# Patient Record
Sex: Female | Born: 1955 | Race: White | Hispanic: No | Marital: Married | Smoking: Never smoker
Health system: Southern US, Community
[De-identification: ages and names within clinical notes are randomized; demographics above are authoritative.]

## PROBLEM LIST (undated history)

## (undated) DIAGNOSIS — C20 Malignant neoplasm of rectum: Secondary | ICD-10-CM

## (undated) DIAGNOSIS — G5700 Lesion of sciatic nerve, unspecified lower limb: Secondary | ICD-10-CM

## (undated) DIAGNOSIS — M549 Dorsalgia, unspecified: Secondary | ICD-10-CM

## (undated) DIAGNOSIS — Z9889 Other specified postprocedural states: Secondary | ICD-10-CM

## (undated) DIAGNOSIS — Z87442 Personal history of urinary calculi: Secondary | ICD-10-CM

## (undated) DIAGNOSIS — D128 Benign neoplasm of rectum: Secondary | ICD-10-CM

## (undated) DIAGNOSIS — G8929 Other chronic pain: Secondary | ICD-10-CM

## (undated) DIAGNOSIS — G20A1 Parkinson's disease without dyskinesia, without mention of fluctuations: Secondary | ICD-10-CM

## (undated) DIAGNOSIS — R198 Other specified symptoms and signs involving the digestive system and abdomen: Secondary | ICD-10-CM

## (undated) DIAGNOSIS — Z932 Ileostomy status: Secondary | ICD-10-CM

## (undated) DIAGNOSIS — Z8249 Family history of ischemic heart disease and other diseases of the circulatory system: Secondary | ICD-10-CM

## (undated) HISTORY — DX: Lesion of sciatic nerve, unspecified lower limb: G57.00

## (undated) HISTORY — DX: Other specified symptoms and signs involving the digestive system and abdomen: R19.8

## (undated) HISTORY — DX: Parkinson's disease without dyskinesia, without mention of fluctuations: G20.A1

## (undated) HISTORY — PX: APPENDECTOMY: SHX54

## (undated) HISTORY — PX: CHOLECYSTECTOMY: SHX55

## (undated) HISTORY — PX: TUBAL LIGATION: SHX77

## (undated) HISTORY — PX: ENDOMETRIAL ABLATION: SHX621

## (undated) HISTORY — PX: OTHER SURGICAL HISTORY: SHX169

---

## 1985-08-31 HISTORY — PX: OTHER SURGICAL HISTORY: SHX169

## 1998-03-21 ENCOUNTER — Ambulatory Visit (HOSPITAL_COMMUNITY): Admission: RE | Admit: 1998-03-21 | Discharge: 1998-03-21 | Payer: Self-pay | Admitting: Urology

## 1998-03-28 ENCOUNTER — Observation Stay (HOSPITAL_COMMUNITY): Admission: EM | Admit: 1998-03-28 | Discharge: 1998-03-30 | Payer: Self-pay | Admitting: Emergency Medicine

## 2000-04-20 ENCOUNTER — Emergency Department (HOSPITAL_COMMUNITY): Admission: EM | Admit: 2000-04-20 | Discharge: 2000-04-21 | Payer: Self-pay | Admitting: Emergency Medicine

## 2000-05-25 ENCOUNTER — Other Ambulatory Visit: Admission: RE | Admit: 2000-05-25 | Discharge: 2000-05-25 | Payer: Self-pay | Admitting: Gynecology

## 2000-06-16 ENCOUNTER — Other Ambulatory Visit: Admission: RE | Admit: 2000-06-16 | Discharge: 2000-06-16 | Payer: Self-pay | Admitting: Gynecology

## 2000-06-16 ENCOUNTER — Encounter (INDEPENDENT_AMBULATORY_CARE_PROVIDER_SITE_OTHER): Payer: Self-pay

## 2000-07-30 ENCOUNTER — Encounter (INDEPENDENT_AMBULATORY_CARE_PROVIDER_SITE_OTHER): Payer: Self-pay | Admitting: Specialist

## 2000-07-30 ENCOUNTER — Ambulatory Visit (HOSPITAL_COMMUNITY): Admission: RE | Admit: 2000-07-30 | Discharge: 2000-07-30 | Payer: Self-pay | Admitting: Gynecology

## 2001-09-21 ENCOUNTER — Other Ambulatory Visit: Admission: RE | Admit: 2001-09-21 | Discharge: 2001-09-21 | Payer: Self-pay | Admitting: Obstetrics and Gynecology

## 2002-03-20 ENCOUNTER — Encounter: Payer: Self-pay | Admitting: Urology

## 2002-03-20 ENCOUNTER — Ambulatory Visit (HOSPITAL_BASED_OUTPATIENT_CLINIC_OR_DEPARTMENT_OTHER): Admission: RE | Admit: 2002-03-20 | Discharge: 2002-03-20 | Payer: Self-pay | Admitting: Urology

## 2003-11-01 ENCOUNTER — Other Ambulatory Visit: Admission: RE | Admit: 2003-11-01 | Discharge: 2003-11-01 | Payer: Self-pay | Admitting: Gynecology

## 2004-01-25 ENCOUNTER — Ambulatory Visit (HOSPITAL_COMMUNITY): Admission: RE | Admit: 2004-01-25 | Discharge: 2004-01-25 | Payer: Self-pay | Admitting: Urology

## 2004-01-25 ENCOUNTER — Ambulatory Visit (HOSPITAL_BASED_OUTPATIENT_CLINIC_OR_DEPARTMENT_OTHER): Admission: RE | Admit: 2004-01-25 | Discharge: 2004-01-25 | Payer: Self-pay | Admitting: Urology

## 2004-01-31 ENCOUNTER — Ambulatory Visit (HOSPITAL_COMMUNITY): Admission: RE | Admit: 2004-01-31 | Discharge: 2004-01-31 | Payer: Self-pay | Admitting: Urology

## 2006-05-07 ENCOUNTER — Other Ambulatory Visit: Admission: RE | Admit: 2006-05-07 | Discharge: 2006-05-07 | Payer: Self-pay | Admitting: Gynecology

## 2006-06-11 ENCOUNTER — Encounter: Admission: RE | Admit: 2006-06-11 | Discharge: 2006-06-11 | Payer: Self-pay | Admitting: Gastroenterology

## 2006-08-30 ENCOUNTER — Inpatient Hospital Stay (HOSPITAL_COMMUNITY): Admission: RE | Admit: 2006-08-30 | Discharge: 2006-09-09 | Payer: Self-pay | Admitting: Surgery

## 2006-08-30 ENCOUNTER — Encounter (INDEPENDENT_AMBULATORY_CARE_PROVIDER_SITE_OTHER): Payer: Self-pay | Admitting: Specialist

## 2006-08-31 HISTORY — PX: COLON RESECTION: SHX5231

## 2006-09-23 ENCOUNTER — Encounter: Admission: RE | Admit: 2006-09-23 | Discharge: 2006-09-23 | Payer: Self-pay | Admitting: General Surgery

## 2006-09-29 ENCOUNTER — Ambulatory Visit (HOSPITAL_COMMUNITY): Admission: RE | Admit: 2006-09-29 | Discharge: 2006-09-29 | Payer: Self-pay | Admitting: Surgery

## 2006-10-07 ENCOUNTER — Ambulatory Visit (HOSPITAL_COMMUNITY): Admission: RE | Admit: 2006-10-07 | Discharge: 2006-10-07 | Payer: Self-pay | Admitting: Interventional Radiology

## 2006-10-18 ENCOUNTER — Ambulatory Visit (HOSPITAL_COMMUNITY): Admission: RE | Admit: 2006-10-18 | Discharge: 2006-10-18 | Payer: Self-pay | Admitting: Interventional Radiology

## 2006-10-28 ENCOUNTER — Ambulatory Visit (HOSPITAL_COMMUNITY): Admission: RE | Admit: 2006-10-28 | Discharge: 2006-10-28 | Payer: Self-pay | Admitting: Interventional Radiology

## 2006-11-01 ENCOUNTER — Ambulatory Visit: Admission: RE | Admit: 2006-11-01 | Discharge: 2006-11-01 | Payer: Self-pay | Admitting: Surgery

## 2006-11-30 ENCOUNTER — Encounter: Admission: RE | Admit: 2006-11-30 | Discharge: 2006-11-30 | Payer: Self-pay | Admitting: Surgery

## 2007-01-12 ENCOUNTER — Ambulatory Visit (HOSPITAL_COMMUNITY): Admission: RE | Admit: 2007-01-12 | Discharge: 2007-01-12 | Payer: Self-pay | Admitting: Surgery

## 2007-02-02 ENCOUNTER — Encounter: Admission: RE | Admit: 2007-02-02 | Discharge: 2007-02-02 | Payer: Self-pay | Admitting: Surgery

## 2007-02-21 ENCOUNTER — Encounter: Admission: RE | Admit: 2007-02-21 | Discharge: 2007-02-21 | Payer: Self-pay | Admitting: Surgery

## 2007-03-30 ENCOUNTER — Inpatient Hospital Stay (HOSPITAL_COMMUNITY): Admission: RE | Admit: 2007-03-30 | Discharge: 2007-04-06 | Payer: Self-pay | Admitting: Surgery

## 2007-03-30 ENCOUNTER — Encounter (INDEPENDENT_AMBULATORY_CARE_PROVIDER_SITE_OTHER): Payer: Self-pay | Admitting: Surgery

## 2007-09-01 HISTORY — PX: OTHER SURGICAL HISTORY: SHX169

## 2007-09-13 ENCOUNTER — Ambulatory Visit (HOSPITAL_BASED_OUTPATIENT_CLINIC_OR_DEPARTMENT_OTHER): Admission: RE | Admit: 2007-09-13 | Discharge: 2007-09-13 | Payer: Self-pay | Admitting: Urology

## 2007-11-16 ENCOUNTER — Other Ambulatory Visit: Admission: RE | Admit: 2007-11-16 | Discharge: 2007-11-16 | Payer: Self-pay | Admitting: Gynecology

## 2008-03-31 HISTORY — PX: HERNIA REPAIR: SHX51

## 2009-05-15 ENCOUNTER — Inpatient Hospital Stay (HOSPITAL_COMMUNITY): Admission: RE | Admit: 2009-05-15 | Discharge: 2009-05-17 | Payer: Self-pay | Admitting: Surgery

## 2010-09-21 ENCOUNTER — Encounter: Payer: Self-pay | Admitting: General Surgery

## 2010-09-21 ENCOUNTER — Encounter: Payer: Self-pay | Admitting: Surgery

## 2010-11-30 HISTORY — PX: OTHER SURGICAL HISTORY: SHX169

## 2010-12-05 LAB — CBC
HCT: 38.8 % (ref 36.0–46.0)
Hemoglobin: 12.9 g/dL (ref 12.0–15.0)
MCV: 85.6 fL (ref 78.0–100.0)
Platelets: 278 10*3/uL (ref 150–400)
RBC: 4.54 MIL/uL (ref 3.87–5.11)
RDW: 14.1 % (ref 11.5–15.5)
WBC: 7 10*3/uL (ref 4.0–10.5)

## 2010-12-05 LAB — COMPREHENSIVE METABOLIC PANEL
CO2: 27 mEq/L (ref 19–32)
Chloride: 110 mEq/L (ref 96–112)
GFR calc non Af Amer: >60 mL/min (ref >60)
Potassium: 3.7 mEq/L (ref 3.5–5.1)
Sodium: 142 mEq/L (ref 135–145)
Total Protein: 7 g/dL (ref 6.0–8.3)

## 2010-12-22 ENCOUNTER — Encounter (INDEPENDENT_AMBULATORY_CARE_PROVIDER_SITE_OTHER): Payer: 59 | Admitting: Women's Health

## 2010-12-22 ENCOUNTER — Other Ambulatory Visit: Payer: Self-pay | Admitting: Women's Health

## 2010-12-22 ENCOUNTER — Other Ambulatory Visit (HOSPITAL_COMMUNITY)
Admission: RE | Admit: 2010-12-22 | Discharge: 2010-12-22 | Disposition: A | Discharge status: Home / Self Care | Payer: 59 | Source: Ambulatory Visit | Attending: Gynecology | Admitting: Gynecology

## 2010-12-22 DIAGNOSIS — Z124 Encounter for screening for malignant neoplasm of cervix: Secondary | ICD-10-CM | POA: Insufficient documentation

## 2010-12-22 DIAGNOSIS — E079 Disorder of thyroid, unspecified: Secondary | ICD-10-CM

## 2010-12-22 DIAGNOSIS — R823 Hemoglobinuria: Secondary | ICD-10-CM

## 2010-12-22 DIAGNOSIS — Z833 Family history of diabetes mellitus: Secondary | ICD-10-CM

## 2010-12-22 DIAGNOSIS — Z13220 Encounter for screening for lipoid disorders: Secondary | ICD-10-CM

## 2010-12-22 DIAGNOSIS — Z01419 Encounter for gynecological examination (general) (routine) without abnormal findings: Secondary | ICD-10-CM

## 2011-01-13 NOTE — Op Note (Signed)
[name], JEMISON NO.:  [id number]   MEDICAL RECORD NO.:  [id number]          PATIENT TYPE:  AMB   LOCATION:  NESC                         FACILITY:  [facility]   PHYSICIAN:  Boston Service, M.D.DATE OF BIRTH:  10/27/1955   DATE OF PROCEDURE:  09/13/2007  DATE OF DISCHARGE:                               OPERATIVE REPORT   PREOPERATIVE DIAGNOSIS:  A 7 mm left proximal ureteral calculus.   POSTOPERATIVE DIAGNOSIS:  A 7 mm left proximal ureteral calculus.   PROCEDURE:  Cystoscopy, retrograde ureteroscopy, holmium laser  fragmentation and stent placement.   SURGEON:  Boston Service, M.D.   ASSISTANT:  None.   ANESTHESIA:  General.   FINDINGS:  A 7 mm left ureteral calculus.   SPECIMENS:  Multitude of small stony fragments.   ESTIMATED BLOOD LOSS:  Minimal.   COMPLICATIONS:  None obvious.   DESCRIPTION OF PROCEDURE:  The patient was prepped and draped in the  dorsal lithotomy position after institution of an adequate level of  general anesthesia.  Well lubricated 21-French panendoscope was gently  inserted at the urethral meatus.   Blocking catheter was selected, right and left retrogrades were  performed.  Normal course and caliber of the ureter, pelvis and calyces  on the right.  The patient had what appeared to be a densely impacted  calculus at the level of the proximal third of the left ureter with  proximal hydronephrosis.   Once retrogrades had been completed, guidewire was inserted at the left  ureteral orifice with care.  Attempt was made to pass a conventional  guidewire beyond the stone, unable to do so.  Conventional guidewire was  removed and replaced with a Glidewire which advanced into what appeared  to be dilated upper pole calyces.  Retrograde confirm the position of  the Glidewire.  End-hole catheter was advanced over the Glidewire.  Conventional guidewire was passed through the end-hole catheter and then  the ureteroscope was  inserted alongside the guidewire.  Stone appeared  to be densely impacted within the proximal ureter.  There had been some  initial concerns that perhaps the stone would migrate proximally during  therapy, however, stone appeared impacted at that location.  The 365  fiber was selected and fragmentation was commenced at a setting of 0.5  joules.  Stone appeared to break up easily and was then fragmented over  a period of about 20-25 minutes.  Laser fiber was then withdrawn.  Ureteroscope was advanced.  No other large stony fragments could be  identified within the proximal ureter and then the ureteroscope was  carefully withdrawn.  The indwelling guidewire remained in good position  and was used to place a 6-French 24  cm double-J stent with what appeared to be good pigtail formation both  within the left renal pelvis and within the bladder.  Bladder was  drained.  Cystoscope was removed.  The patient was given a B&O  suppository and returned to recovery in satisfactory condition.           ______________________________  Boston Service, M.D.     RH/MEDQ  D:  09/13/2007  T:  09/13/2007  Job:  [id number]   cc:   [facility]

## 2011-01-13 NOTE — Op Note (Signed)
NAMELILLYANN, [name] NO.:  [id number]   MEDICAL RECORD NO.:  [id number]          PATIENT TYPE:  AMB   LOCATION:  DAY                          FACILITY:  [facility]   PHYSICIAN:  Wilmon Arms. Corliss Skains, M.D. 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   PROCEDURES PERFORMED:  1. Examination under anesthesia.  2. Rigid sigmoidoscopy.  3. Flexible sigmoidoscopy.  4. Rectal dilatation.   SURGEON:  Wilmon Arms. Corliss Skains, M.D.   ASSISTANTAngelia Mould. Derrell Lolling, M.D.   INDICATIONS:  The patient is a 55 year old female who underwent a low  anterior resection in December4 for a large, low, tubulovillous  adenoma.  At the time of surgery she had an inadvertent vaginal injury  which was immediately recognized and repaired.  She had an anastomosis  with an EEA stapler as well as a diverting ileostomy.  Postoperatively  the patient developed a pelvic abscess with apparent small fistulous  communication with the rectum.  This has persisted with the last barium  enema about a month ago.  That barium enema also showed no sign of  abscess cavity; and just a very tiny wisp of contrast.  Proximally there  seemed to be some rectal stricture above the anastomosis.   DESCRIPTION OF PROCEDURE:  The patient was brought to the operating  room, and placed in the supine position on the operating room table.  After an adequate level of general anesthesia was obtained, the  patient's legs were placed in lithotomy position in yellow fin stirrups.  Her perineum was prepped with Betadine and draped in a sterile fashion.  Her anal canal was dilated up to 3 fingers with lubricated fingers.  The  bivalve rectal retractor was inserted.  There seemed to be some mild  proctitis.   We were unable to visualize the staple line, but with  digital examination, I could feel the staple line at the tip of my  finger.  We then inserted the rigid sigmoidoscope.  We were able to  visualize the staple line and advanced just past that, but we were  unable to pass the sigmoidoscope much more proximally.   We then removed the sigmoidoscope and reinserted our retractors.  Using  Hegar dilators we dilated the stricture up to 49 [place].  This was done  with minimal resistance.  I was unable to pass the flexible  sigmoidoscope through the stricture and up into the descending colon.  The descending colon appeared to be normal.  As we slowly withdrew the  scope, there was some minimal inflammation of the mucosa around the  stricture.  The stricture let the scope pass easily.  The colon seemed  fairly tortuous around the area just above the anastomosis, but we were  able to negotiate this easily with the sigmoidoscope.  We slowly  withdrew the scope back into the rectum and suctioned out the air.  We  then changed gloves and with a sterile vaginal speculum, did a vaginal  examination.  The cervix was identified.  Posterior to this we could see  the area which appeared to be the site of the previous injury.  This was  completely  healed and covered with mucosa.  No sign of purulent drainage in the  vagina.  The retractors were then removed.  The patient was then  extubated, and brought to recovery in stable condition.  All sponge,  instrument, and needle counts were correct.      Wilmon Arms. Tsuei, M.D.  Electronically Signed     MKT/MEDQ  D:  01/12/2007  T:  01/12/2007  Job:  [id number]

## 2011-01-13 NOTE — Op Note (Signed)
[name], [name] NO.:  [id number]   MEDICAL RECORD NO.:  [id number]          PATIENT TYPE:  INP   LOCATION:  1308                         FACILITY:  [facility]   PHYSICIAN:  Wilmon Arms. Corliss Skains, M.D. 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   PROCEDURE PERFORMED:  Ileostomy closure.   SURGEON:  Wilmon Arms. Corliss Skains, M.D.   ASSISTANT:  Dr. Kendrick Ranch.   ANESTHESIA:  General endotracheal.   INDICATIONS:  The patient is a 55 year old female who is status post a  low anterior resection in December 2008 for a tubulovillous adenoma.  At  the time of surgery she had an inadvertent vaginal injury which was  repaired.  She had a very low rectal anastomosis with EEA stapler.  This  was protected with a loop ileostomy.  The patient developed a pelvic  abscess postoperatively which was drained.  She had an apparent small  leak at her rectal anastomosis.  This has been given time to heal with a  diverting ileostomy.  She is she is now presenting for ileostomy  closure.   DESCRIPTION OF PROCEDURE:  The patient is brought to the operating room  and placed in supine position on the operating table.  After an adequate  level of general anesthesia was obtained, the patient's abdomen was  exposed.  The ileostomy was closed with pursestring suture of 2-0 silk  suture.  A Foley catheter was placed under sterile technique.  The  patient's abdomen was prepped with Betadine and draped in sterile  fashion.  Elliptical incision was made around the ileostomy.  Cautery  was used to dissect down the subcutaneous tissues.  We dissected down  along the edges of the loop ileostomy.  We entered the peritoneal  cavity.  There were some adhesions which were taken down  with cautery  and Metzenbaum scissors.  Once the entire end of the loop was freely  mobile.  We brought it up into the wound.  We selected a segment of the  small bowel which was clean and free of scar tissue.  Enterotomies were  created on each limb of the small bowel.  The GIA stapler was inserted  into these enterotomies and a side-to-side stapled anastomosis was  created.  The common enterotomy was closed with another GIA stapler  which also amputated the remaining old ileostomy site.  The ileostomy  was palpated and was widely patent.  Hemostasis was good.  We cleared  the fascia of adhesions several centimeters in all directions.  The  peritoneal cavity was then  thoroughly irrigated with saline.  The fascia was closed with #1 PDS in  running fashion.  The subcutaneous tissues were copiously irrigated and  staples were loosely placed in the skin.  Dry dressing was applied.  The  patient was extubated and brought to recovery in stable condition.  All  sponge, instrument, and  needle counts correct.      Wilmon Arms. Tsuei, M.D.  Electronically Signed     MKT/MEDQ  D:  03/30/2007  T:  03/31/2007  Job:  [id number]

## 2011-01-16 NOTE — Consult Note (Signed)
NAMEKOREE, STAHELI NO.:  [id number]   MEDICAL RECORD NO.:  [id number]          PATIENT TYPE:  INP   LOCATION:  5737                         FACILITY:  [facility]   PHYSICIAN:  M. Leda Quail, MD  DATE OF BIRTH:  11/20/1955   DATE OF CONSULTATION:  DATE OF DISCHARGE:                                 CONSULTATION   Mrs. 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 Manus Rudd.  She is having a  resection of this tumor during which time I was called for consultation.  The consultation was requested because of an incision on the vagina.  At  the time of beginning to close the rectal anastomosis, a stapling  instrument was placed intravaginally .  Therefore, a line of staples was  placed across the vaginal mucosa and the vaginal mucosa was incised  sharply.  The staples were removed by Dr. Corliss Skains who called me for my  opinion about proceeding with the case.   At the time I entered the O.R. the patient was stable and good  visualization was noted.  I was sterilely gowned and gloved.  I  inspected intra-abdominally first which did show an incision in the  posterior vaginal mucosa.  The cervix could be palpated with my index  finger.  The uterus was retracted anteriorly.  At this time, a sterile  glove was placed on my right hand, and I inspected vaginally.  The  patient was in a low lithotomy position, and an intravaginal examination  was very easy.  The length my middle finger could not pass through the  incision in the vagina; however, Dr. Fatima Sanger finger placed in the  incision could meet mine.  This incision was therefore like a posterior  colpotomy incision.  Sterile gloves were changed again, and I inspected  again intra-abdominally.  At this time, a decision was made to go ahead  and close vaginal mucosa with a figure-of-eight sutures of #0 Vicryl.  These were placed intra-abdominally, and the mucosa both  anteriorly and  posteriorly were reapproximated.  Three figure-of-eight sutures were  placed to close this posterior colpotomy incision completely.  After  this was done, the vaginal mucosa was re-inspected with the operator's  index finger.  Dr. Fatima Sanger finger was across the suture line, and mine  was across the suture line intravaginally.  No sutures could be  palpated, and the incision was completely closed.  At this point, as my  services were no longer necessary I left the O.R.  The procedure will be  documented as a closure of posterior colpotomy incision.  There was  minimal blood loss from my portion of the procedure.  Dr. Corliss Skains will  continue the dictation for the surgery.      Lum Keas, MD  Electronically Signed     MSM/MEDQ  D:  08/30/2006  T:  08/31/2006  Job:  [id number]

## 2011-01-16 NOTE — Op Note (Signed)
[facility]  Patient:    [name], [name]                     MRN: [id number] Proc. Date: 07/30/00 Adm. Date:  [id number] Disc. Date: [id number] Attending:  Tobey Bride                           Operative Report  PATIENT NUMBER:               [id number]  PREOPERATIVE DIAGNOSIS:       1. Menometrorrhagia.                               2. Dysmenorrhea.                               3. Endometrial polyps/uterine fibroids.  POSTOPERATIVE DIAGNOSIS:      Endometrial polyps and submucous myomas.  OPERATION:                    1. Resectoscopic polypectomy/myomectomy.                               2. Suction curettage.                               3. Endometrial ablation roller bar.  SURGEON:                      Juan H. Lily Peer, M.D.  FINDINGS:                     Multiple intrauterine polyps and submucous myomas of various sizes scattered throughout the intrauterine cavity.  The patient also had what appears to be a small arcuate uterus, normal endocervical canal, normal tubal ostia bilaterally.  INDICATIONS:                  A 55 year old gravida 3, para 3, with previous sterilization procedure in the past complaining of menometrorrhagia, dysmenorrhea.  Preoperative evaluation consisted of endometrial biopsy which was benign.  The patient, on sonohistogram, had endometrial polyps and uterine fibroids.  Also, the patient had a normal Pap smear.  DESCRIPTION OF PROCEDURE:     After the patient was adequately counselled, she was taken to the operating room where she underwent successful general endotracheal anesthesia.  She was placed in the low lithotomy position, and the vagina and perineum were prepped and draped in the usual sterile fashion. A laminaria that was placed the day before in an effort to facilitate operative resectoscope was removed, and the vagina and perineum had been prepped with Betadine solution.  A red rubber Roxan Hockey was  inserted to evacuate bladder contents for approximately 50 cc.  The examination under anesthesia reconfirmed slightly in introverted uterus with no palpable adnexal masses.  The Senn retractor and the weighted speculum were placed in the vaginal vault for exposure.  The anterior cervical lip was grasped with a single-tooth tenaculum.  Following this, the [facility] operative resectoscope with a 90 degree wire loop was inserted into the intrauterine cavity; 3% sorbitol was used, chilled, as a distending media.  The [facility] generator was utilized,  80 watts in the cutting mode and 80 watts on the coagulation mode, in a systematic fashion requiring several passes to remove the various submucous myoma/polyps was performed with meticulous cauterization of bleeding sites and systematically cleaning out the entire uterine cavity. Following this, suction curet was utilized to remove the fragments of polpys/submucous myomas for better visualization of the cavity.  Once this was completed and the ostia identified and no other lesion noted, the endometrial cavity was ablated with a roller bar approximately 2 to 3 mm in depth in a circumferential fashion incorporating the entire uterine cavity up to the internal cervical os.  Pre and postprocedure pictures were obtained.  A copy will be kept at [facility] and a second set at [facility] office patient record.  The patient tolerated the procedure well. She was extubated and transferred to the recovery room.  Blood loss was minimal.  Fluid resuscitation consisted of 2000 cc of lactated ringers.  The patient received a gram of Cefotan for prophylaxis, and fluid deficit from the distending media was only 230 cc. DD:  07/30/00 TD:  07/30/00 Job: [id number]

## 2011-01-16 NOTE — Op Note (Signed)
NAME:  [name], [name]                        ACCOUNT NO.:  [id number]   MEDICAL RECORD NO.:  [id number]                   PATIENT TYPE:  AMB   LOCATION:  NESC                                 FACILITY:  [facility]   PHYSICIAN:  Rozanna Boer., M.D.      DATE OF BIRTH:  1955-11-17   DATE OF PROCEDURE:  01/25/2004  DATE OF DISCHARGE:                                 OPERATIVE REPORT   PREOPERATIVE DIAGNOSIS:  Right ureteral obstruction by proximal ureteral  stone, 6 mm.   POSTOPERATIVE DIAGNOSIS:  Right ureteral obstruction by proximal ureteral  stone, 6 mm.   OPERATION:  Cystoscopy, right retrograde pyelogram, insertion of right  ureteral stent.   ANESTHESIA:  General.   SURGEON:  Courtney Paris, M.D.   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  She had milder symptoms a week earlier.  CT scan  showed obstructing 6 mm proximal right ureteral stone with hydronephrosis.  She had previous lithotripsy July 1999 and July 2002.  She has drug  allergies to [facility], and her allergies include oxycodone.  She had  previous C-sections x3, laparoscopy in 1987 and 1990, and appendectomy in  1985, gallbladder in 1987.  Her preop urine showed TNTC white cells, and her  white count was 19,000.   The patient was placed on the operating table in the dorsal lithotomy  position, after satisfactory induction of general anesthesia was prepped and  draped with Betadine and given Cipro 400 mg IV.  The bladder was  catheterized with the panendoscope and somewhat cloudy urine was sent for  culture.  The bladder was fairly normal, no mucosal lesions seen, trigone  and orifices looked normal.  The right orifice was catheterized with an open-  ended 6 [place] ureteral catheter and an occlusive retrograde demonstrated no  obstruction up to the stone, which was seen at about opposite L3 on the  right.  There was some mild hydronephrosis  behind this.  Through the open-  ended catheter a 0.038 floppy-tip guidewire was passed beyond the stone up  to the level of the kidney.  A 6 French x 24 cm length double J ureteral  stent was then passed over the guidewire.  The coil was seen to go in the  lower pole, but I could not seem to manipulate it out.  It seemed to be  draining well.  The bladder was then drained, the scope removed, the patient  taken to the recovery room in good condition.  She was given IV Toradol and  a B&O suppository and will be covered with Cipro postoperatively as well.  She will be set up for lithotripsy later next week as an outpatient.  She  went to the recovery room in good condition.  Rozanna Boer., M.D.    HMK/MEDQ  D:  01/25/2004  T:  01/26/2004  Job:  [id number]

## 2011-01-16 NOTE — Discharge Summary (Signed)
[name], BERKHEIMER NO.:  [id number]   MEDICAL RECORD NO.:  [id number]          PATIENT TYPE:  INP   LOCATION:  5737                         FACILITY:  [facility]   PHYSICIAN:  Wilmon Arms. Corliss Skains, M.D. DATE OF BIRTH:  12-05-1955   DATE OF ADMISSION:  08/30/2006  DATE OF DISCHARGE:  09/09/2006                               DISCHARGE SUMMARY   PREOPERATIVE DIAGNOSIS:  Rectal mass.   DISCHARGE DIAGNOSIS:  Tubulovillous adenoma of the rectum.   PROCEDURES PERFORMED:  1. Low anterior resection.  2. Rigid sigmoidoscopy.  3. Repair of vaginal entry.  4. Diverting ileostomy.  5. Placement of right transgluteal pelvic drain under CT guidance.  6. Drainage of subcutaneous wound infection.   BRIEF HISTORY:  The patient is a 55 year old female who is in excellent  health who had recently noted some hematochezia.  A colonoscopy showed a  large mass in the rectum.  Biopsy showed tubulovillous adenoma with no  sign of malignancy.  However, the tumor is very large and there was  significant concern for sampling error.  After preoperative counseling,  we recommended low anterior resection.   HOSPITAL COURSE:  After undergoing a home bowel prep, patient was  admitted to the hospital on August 30, 2006.  She underwent  exploratory laparotomy with low anterior resection.  Her procedure was  complicated by a posterior vaginal entry from the stapler.  This was  immediately recognized and the vaginal entry was repaired with  assistance from Dr. Leda Quail of GYN.  The patient had a diverting  ileostomy placed.  Her stapled rectal anastomosis did have a small leak  which was repaired intraoperatively.  However, the ileostomy was placed  to allow time for the anastomosis to completely heal.   The patient was transferred from the recovery room to 5700.  She had  some problems with low grade fever which was intermittent.  Her white  count also remained high.  Her ileus resolved  fairly quickly and she  began having good ileostomy output.  She was found to have a superficial  wound infection.  The upper half of her wound was opened at the bedside  with drainage of some purulent fluid from the subcutaneous tissues.  The  wound was initially dressed with wet-to-dry dressings.  A VAC dressing  was later placed with excellent results.  The patient has been  thoroughly educated on her ileostomy care.  On postoperative day #6, the  patient began having fevers again.  Her white count began to increase  again.  She underwent a CT scan of the abdomen and pelvis which showed  several collections of fluid but a fluid collection with an enhancing  rim deep in the pelvis.  There was no purulent drainage from the  surgically placed pelvic drain.  However, this new fluid collection did  not appear to be drained by the surgical drain.  Therefore, the patient  underwent a CT guided percutaneous drain placed through a right  transgluteal approach.  This was successful in draining a large amount  of purulent material.  The patient's white count  has come back down and  her fever curve has also decreased.   CONDITION ON DISCHARGE:  The patient is doing very well.  She is  tolerating a regular diet.  Her ileostomy is functioning well with no  skin issues.  Her wound is beginning to granulate well with the VAC  dressing.  Her transgluteal drain is draining a moderate amount of  serous fluid.  Her previous surgical drain has already been removed.  Staples still remain in the lower part of her incision which appears to  be free of infection at this time.  Cultures from the percutaneous  abscess drainage showed no growth to date.   DISCHARGE INSTRUCTIONS:  The patient is given Percocet p.r.n. for pain.  Due to some problems with acid reflux, she is being sent home on  Protonix.  We will switch her antibiotics to ciprofloxacin 500 mg p.o.  b.i.d. and Flagyl 500 mg p.o. t.i.d.  She will  follow up with me next  week for staple removal.  Home health nurse has been arranged for VAC  change for Monday, Wednesday, Friday as well as assistance with  ileostomy care.  The patient has also been instructed on emptying and  recording her drainage daily.  Once the drainage decreases to less than  10 mL in a 24-hour period, we will repeat her CT scan to evaluate the  abscess cavity.      Wilmon Arms. Tsuei, M.D.  Electronically Signed     MKT/MEDQ  D:  09/09/2006  T:  09/09/2006  Job:  [id number]   cc:   Jordan Hawks. Elnoria Howard, MD

## 2011-01-16 NOTE — H&P (Signed)
[facility]  Patient:    [name], [name]                     MRN: [id number] Adm. Date:  [id number] Disc. Date: [id number] Attending:  Tobey Bride                         History and Physical  CHIEF COMPLAINT:              1. Menometrorrhagia.                               2. Dysmenorrhea.                               3. Endometrial polyps.  HISTORY OF PRESENT ILLNESS:   The patient is a 55 year old, gravida 3, para 3 who was seen on September 25 of this year for annual gynecological examination. The patient had not previously been seen in the office over a 2 year period. She had complained that her menses would continue to increase heavily for the previous 6 months. She would change approximately every hour and a half and her bleeding would increase with large quantities of blood clots as well. Her workup has included a TSH which is normal. Her hemoglobin October 16 was 12.3, hematocrit 36.2, platelet count 270,000. She also had as part of her workup an endometrial biopsy as well as a sonohysterogram on October 17 of this year which demonstrated benign proliferative endometrium with a sonohysterogram. A small echo free cyst measuring 18 x 17 mm on the left ovary, the right ovary was normal. The uterus is arcuit in shape, mild fundal bifurcation of the cavity was noted. The endometrial stripe was 10.2 mm, subserous fibroid was noted in the posterior uterine wall measuring 18 x 12 mm and 15 x 12 mm respectively. After the saline infusion, 2 polyps were identified, 1 measured 18 x 15 x 13 mm central cystic area anterior uterine wall and a 16 x 5 mm with a long stalk on the posterior wall was noted. The patient in an effort to control bleeding and prepare her for her surgery, she has been placed on Megace 20 mg b.i.d. Her last pap smear on September 25 of this year was normal. The patient had been presented with options to regulate her hormonally  but this was prior to identifying the intrauterine lesion. She is interested in proceeding with her cystoscopic polypectomy and concurrently an endometrial oblation. She has had a previous sterilization at the time of her last cesarean section.  PAST MEDICAL HISTORY:         She denies smoking, alcohol consumption. Her menarche was age 65.  ALLERGIES:                    MACROBID.  PAST SURGICAL HISTORY:        Three cesarean sections and a bilateral tubal sterilization, appendectomy, cholecystectomy, 2 prior laparoscopies.  She denies any other current problems.  MEDICATIONS:                  Megace 20 mg b.i.d.  ADDITIONAL MEDICAL PROBLEMS:      She has recent kidney stones October 31 her last episode and she has had a history of a fracture of her vertebrae at L5.  PHYSICAL EXAMINATION:  GENERAL:                      The patient is 5 feet 4 inches tall, 220 pounds.  HEENT:                        Unremarkable.  NECK:                         Supple. Trachea midline. No carotid bruits or thyromegaly.  LUNGS:                        Clear to auscultation without rhonchi or wheezes.  HEART:                        Regular rate and rhythm. No murmurs or gallops.  BREAST:                       Examination was done at the time of her annual gynecological examination recently September of this year which was reported to be unremarkable.  ABDOMEN:                      Soft, nontender without rebound or guarding.  PELVIC:                       Bartholins, urethra and skenes glands within normal limits. Uterus mid plane, nontender, no adnexal masses nor tenderness.  RECTAL:                       Unremarkable.  ASSESSMENT:                   A 55 year old gravida 3, para 3 with history of menometrorrhagia, dysmenorrhea. Workup has demonstrated 2 endometrial polyps as a potential culprit for her dysfunctional uterine bleeding. She has had a previous sterilization. We discussed  proceeding with a resectoscopic polypectomy and an endometrial oblation concurrently. The patient previously provided with literature information outlining the risks, benefits, pros and cons, potential complications to include uterine perforation, fluid overload and pulmonary edema, pulmonary embolism, infection or hemorrhage or the possibility of an emergency exploratory laparotomy for correction of internal abdominal trauma. In the event of a blood transfusion, she is fully aware of its potential risks such as anaphylactic reaction, hepatitis and AIDS. The patient also is aware that although we have adequately sampled the intrauterine cavity that a sonohysterogram that there is always a remote possibility that there could be some underlying malignancy which could manifest years later inside the uterine cavity. All of this information was related to the patient. She understands and accepts and will follow accordingly.  PLAN:  The patient is scheduled for resectoscopic polypectomy and endometrial oblation on Friday, November 30 at [facility]. DD:  07/29/00 TD:  07/30/00 Job: [id number]

## 2011-01-16 NOTE — Op Note (Signed)
[name], [name] NO.:  [id number]   MEDICAL RECORD NO.:  [id number]          PATIENT TYPE:  INP   LOCATION:  2550                         FACILITY:  [facility]   PHYSICIAN:  Wilmon Arms. Corliss Skains, M.D. DATE OF BIRTH:  16-Jan-1956   DATE OF PROCEDURE:  08/30/2006  DATE OF DISCHARGE:                               OPERATIVE REPORT   PREOPERATIVE DIAGNOSIS:  Rectal mass.   POSTOPERATIVE DIAGNOSIS:  Rectal mass.   PROCEDURES PERFORMED:  1. Lower anterior resection.  2. Rigid sigmoidoscopy.  3. Repair of vaginal injury (intraoperative consult Dr. Leda Quail).  4. Diverting ileostomy.   SURGEON:  Wilmon Arms. Corliss Skains, M.D.   ASSISTANTAngelia Mould. Derrell Lolling, M.D.  Currie Paris, M.D.   INTRAOPERATIVE CONSULT:  Lum Keas, MD, GYN.   ANESTHESIA:  General endotracheal.   INDICATIONS:  The patient is a 55 year old female in excellent health,  who recently noticed some bright red blood per rectum.  She underwent a  colonoscopy which showed a large mass in the rectum.  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  However, the tumor was very large and there was significant concern for  sampling error.  The patient underwent a preoperative endorectal  ultrasound, which showed this to be only a mucosal lesion -- which would  be a T1 lesion if it was a malignancy.  We counseled her regarding  surgical resection.  This is a fairly low lesion, but based on the  ultrasound it seemed like there was enough length to transect distal to  the mass and preserve anal function.   FINDINGS:  1. Very low, soft, large mass in the rectum; with a clear distal      margin measuring between 1-2 cm.  2. Vaginal injury during the stapling of the anastomosis.   DESCRIPTION OF PROCEDURE:  The patient was brought to the operating room  after undergoing a preoperative bowel prep at home.  She was placed on  the table in supine  position.  After an adequate level of general  anesthesia was obtained, the patient had a Foley catheter placed under  sterile technique.  Her legs were placed in yellowfin stirrups.  Her  abdomen was then prepped with Betadine and draped in sterile fashion.  A  time-out was taken to assure the proper patient and proper procedure.  A  lower midline incision was made.  Dissection was carried down through  the subcutaneous fat to the fascia, which was opened along its midline.  We entered the peritoneal cavity; there were minimal adhesions along the  surface of the abdominal wall.  The Balfour retractor was inserted along  with the bladder blade.  The small bowel was packed and the upper  abdomen retracted with the Balfour extension.  We began mobilizing the  sigmoid colon by detaching it from the left lateral abdominal wall.  We  continued this down toward the pelvis..  We transected the sigmoid colon  in the mid portion with a GIA 75 stapler.  The mesocolon was  taken down  with the LigaSure device.  We visually identified both ureters, and kept  these lateral to our dissection.  We entered the presacral space, which  was dissected bluntly.  We divided the lateral mesenteric attachments to  the rectum.  The superior hemorrhoidal vessels were ligated between  clamps and tied with 2-0 silk ties.  We continued our dissection  distally, as we could not palpate the tumor.  When it seemed that we had  reached an area that corresponded to the described location of the  tumor, we did an on-table sigmoidoscopy.  A pursestring suture was made  in the proximal portion of our specimen.  An enterotomy was made and a  rigid sigmoidoscope was inserted.  We advanced this down toward the  rectum.  It appeared that we were still proximal to the tumor.  A suture  was placed in the anterior wall of the rectum just to mark the proximal  margin.  We continued mobilizing the rectum.  The lateral pedicles were   taken down with the LigaSure device.  Once we were confident that we  were distal to the tumor, I went below and inserted a rigid  sigmoidoscope again per rectum.  My assistant palpated the anterior wall  of the rectum and confirmed that we were indeed distal to the mass.  After changing gown and gloves, I went back to the abdomen.  The rectum  was transected with a Contour Green stapler.  A specimen was passed to  the back table, where I opened it.  The tumor was fairly large but soft.  There was an over 1 cm distal margin.  The specimen was sent for  pathologic examination.   The pelvis was then thoroughly irrigated with saline.  We inspected our  staple line at a proximal margin.  The staple line was amputated with  cautery.  Some of the pericolonic fat was trimmed away with cautery.  The EEA sizers were inserted.  We were only able to pass a 25-mm EEA  sizer.  A 2-0 Prolene pursestring suture was placed around the edge of  the colon.  The 25 mm anvil was inserted and the pursestring suture was  tied down.  We inspected the pelvis for hemostasis.  We also inspected  the proximal sigmoid colon, and made sure there was no twist.  At this  point, my assistant went below to the position between the legs.  The  stapler was inserted and the spike was advanced.  The spike was joined  to the anvil and tightened down.  After holding pressure for several  seconds the stapler was fired.  The stapler was then removed.  We  reexamined the anastomosis with the sigmoidoscope; it was at this point  that we realized that we had actually stapled the posterior vaginal wall  to the descending colon.  The rectal stump appeared to be intact, at the  distal staple line.  At this point we called an intraoperative consult  to the gynecologist on call (Dr. Hyacinth Meeker).  While we were waiting for her  arrival, we used cautery to take down the anastomosis.  We made sure not to leave any of the colon attached to the  vaginal wall.  Stay sutures of  2-0 silk were placed in the vaginal wall before the anastomosis was  taken down.  This appeared to be 2.5 cm hole in the posterior wall of  the vagina.  We brought up the anastomosis,  which was not detached from  the posterior vaginal wall.  The end of the colon was amputated with  cautery.  We cleaned off another 2 cm of the ascending colon.  Another  pursestring suture of 2-0 Prolene was placed.  We then packed this out  of the way.   Dr. Hyacinth Meeker arrived and inspected the vaginal injury.  She repaired the  vaginal injury with a number of 0 Vicryl figure-of-eight sutures.  She  reexamined with bimanual examination.  No further vaginal leak was  noted.  The pelvis was once again irrigated.  A new 25 mm anvil was  inserted in through the pursestring suture, and the pursestring suture  was tied down.  I went below to the position  between the legs.  I  inserted the EEA stapler per rectum, and we carefully advanced this to  the end of the rectal stump.  The spike was advanced just anterior to  the staple line.  The spike was mated to the anvil and was tightened  down.  The stapler was held closed for several seconds and then fired.  The stapler was removed.  I then reinserted the rigid sigmoidoscope.  The pelvis was filled with saline.  When I insufflated it appeared that  there was a leak.  The irrigant was suctioned out.  We were able to  locate a leak in the anastomosis posteriorly.  We repaired this with 4  interrupted 2-0 silk sutures.  We then made the decision to do a  protective diverting ileostomy.  The pelvis was then thoroughly  irrigated with saline.  Hemostasis was obtained with cautery.  A  bleeding vessel near the left ovary was ligated with 2-0 silk sutures.  A 19 Blake drain was inserted through a stab incision from the left  side, and inserted into the pelvis.  A small opening was created in the  right lower quadrant for a diverting  ileostomy.  A loop of ileum was  brought up through the fascial opening out through the skin.  This was  held with a Babcock clamp.  The fascia was then closed with a double-  stranded #1 PDS suture.  The subcutaneous tissues were irrigated and  staples were used to close the skin.  The staple line was isolated with  a towel.  A red rubber catheter was inserted underneath the loop running  through the mesentery.  This was secured to  the skin with 2-0 Ethilon sutures.  The loop ileostomy was opened and  matured with interrupted 3-0 Vicryl sutures.  An ostomy appliance was  cut to fit.  Clean dressings were applied to the midline wound.  The  patient was extubated and brought to recovery room in stable condition.  All sponge, instrument and needle counts were correct.      Wilmon Arms. Tsuei, M.D.  Electronically Signed     MKT/MEDQ  D:  08/30/2006  T:  08/30/2006  Job:  [id number]   cc:   Jordan Hawks. Elnoria Howard, MD

## 2011-01-16 NOTE — Discharge Summary (Signed)
[name], BUZAN NO.:  [id number]   MEDICAL RECORD NO.:  [id number]          PATIENT TYPE:  INP   LOCATION:  1308                         FACILITY:  [facility]   PHYSICIAN:  Wilmon Arms. Corliss Skains, M.D. 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   DISCHARGE DIAGNOSES:  1. 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  2. Clostridium difficile colitis.   BRIEF HISTORY:  The patient is a 55 year old female who underwent a low  anterior resection for a large tubulovillous adenoma in December, 2007  Her surgery was complicated by a vaginal injury.  She also had a very  low rectal anastomosis with question of possible leak.  She was then  protected with a diverting loop ileostomy.  She presents now for  ileostomy closure.   HOSPITAL COURSE:  The patient brought to the operating room on March 30, 2007, where she underwent a closure of her loop ileostomy.  The  ileostomy was closed with a stapled side-to-side anastomosis.  The  patient did fairly well postoperatively.  She had a prolonged ileus  which lasted about 4 days.  She began having some bowel movements on  postop day #4 and she was started on diet.  She then began having  copious diarrhea.  Her Clostridium difficile titer was positive and so  she was started on treatment with oral antibiotics; this seemed to  improve her symptoms.  She started tolerating a diet.  She is discharged  home on postop day #7.   DISCHARGE INSTRUCTIONS:  The patient has a very superficial wound  separation which should be packed with gauze daily, wet to dry.   FOLLOWUP:  Return to my office in 2 days for staple removal.   DISCHARGE MEDICATIONS:  1. Vancomycin 125 mg p.o. q.i.d.  2. Percocet p.r.n.  for pain.  3. Phenergan p.r.n. for nausea.   ACTIVITY:  No heavy lifting.      Wilmon Arms. Tsuei, M.D.  Electronically Signed     MKT/MEDQ  D:  04/19/2007  T:  04/20/2007  Job:  [id number]

## 2011-01-16 NOTE — H&P (Signed)
NAMEALICYA, BENA NO.:  [id number]   MEDICAL RECORD NO.:  [id number]          PATIENT TYPE:  INP   LOCATION:  1308                         FACILITY:  [facility]   PHYSICIAN:  Wilmon Arms. Corliss Skains, M.D. DATE OF BIRTH:  Oct 24, 1955   DATE OF ADMISSION:  03/30/2007  DATE OF DISCHARGE:  04/06/2007                              HISTORY & PHYSICAL   CHIEF COMPLAINT:  Diverting ileostomy.   HISTORY OF PRESENT ILLNESS:  The patient is a 55 year old female who  underwent a low anterior resection in December 2007 for a tubulovillous  adenoma.  Her surgery was complicated by a vaginal injury which was  primarily repaired.  The patient had a stable anastomosis low in the  rectum.  She received a diverting ileostomy to allow the anastomosis to  adequately heal.  Despite diversion, the patient had developed a pelvic  abscess.  This was drained percutaneously through a transgluteal drain.  The patient has been managed over the last several months with  antibiotics until the drain was removed.  Her wound infection healed  completely.  She has had several contrast enemas which have shown no  sign of abscess in the pelvis.  Her rectum seems to have healed  completely.  She has had some problems with stricturing of the colon  above the anastomosis, but this was felt to be due to atrophy from  diversion.  She has undergone a colonoscopy as well as dilation of this  area.  The patient now presents for ileostomy closure.   MEDICATIONS:  None.   ALLERGIES:  Macrobid.   PAST MEDICAL HISTORY:  Chronic low back pain.   PAST SURGICAL HISTORY:  1. Laparoscopy.  2. Appendectomy.  3. C-section.  4. Open cholecystectomy.  5. Low anterior resection with diverting ileostomy and repair of      vaginal injury.   SOCIAL HISTORY:  Nonsmoker, nondrinker.   FAMILY HISTORY:  Father deceased from Parkinson's, also peptic ulcer  disease.  Mother is deceased with ALS.   PHYSICAL EXAMINATION:  VITAL  SIGNS:  Height 5 feet 3-1/2 inches, weight  216, blood pressure 118/83, pulse 71, temperature 97.5.  GENERAL:  This is an obese white female in no apparent distress.  HEENT:  EOMI.  Sclerae anicteric.  NECK:  No masses or thyromegaly.  LUNGS:  Clear.  Normal respiratory effort.  HEART:  Regular rate and rhythm.  No murmur.  ABDOMEN:  Well-healed rights subcostal incision, right lower quadrant  incision and lower midline incision.  The patient has a right lower  quadrant ileostomy which is functioning well.  EXTREMITIES:  No edema.  SKIN:  Warm, dry with no sign of jaundice.   IMPRESSION:  Diverting ileostomy with adequate healing of the rectal  anastomosis.   PLAN:  Will admit the patient to the hospital for reversal of her  ileostomy.  We discussed the benefits and risks of the procedure.  The  patient understands and wishes to proceed.      Wilmon Arms. Tsuei, M.D.  Electronically Signed     MKT/MEDQ  D:  04/19/2007  T:  04/20/2007  Job:  [id number]

## 2011-01-16 NOTE — Op Note (Signed)
[facility]  Patient:    [name], [name] Visit Number: [id number] MRN: [id number]          Service Type: NES Location: [facility] Attending Physician:  Ellwood Handler Dictated by:   Verl Dicker, M.D. Admit Date:  03/20/2002 Discharge Date: 03/20/2002   CC:         Deforest Hoyles, M.D.  Daniel L. Eda Paschal, M.D.   Operative Report  DATE OF BIRTH:  Apr 26, 1956  SURGEON:  Verl Dicker, M.D.  PREOPERATIVE DIAGNOSIS:  Faintly calcified, 12 x 7 mm left ureteropelvic junction stone.  POSTOPERATIVE DIAGNOSIS:  Faintly calcified, 12 x 7 mm left ureteropelvic junction stone.  PROCEDURE:  Cystoscopy, retrograde double-J stent placement.  ANESTHESIA:  General.  DRAIN:  6 French 26-cm double-J stent.  DESCRIPTION OF PROCEDURE:  The patient had been previously evaluated in the office. Very faintly calcified, 7 x 12 mm stone identified above the left UPJ, somewhat difficult to visualize given the patients body habitus, 5 feet 3 inches tall, 200 pounds. Decision made to place stent with follow up ESWL. The patient was positioned, dorsal lithotomy. After institution of an adequate level of general anesthesia, a well-lubricated 21 French panendoscope was gently inserted at the urethral meatus. Normal urethra and sphincter. Normal trigone and orifices. Right retrograde showed normal course and caliber of the ureter, pelvis, and calices with prompt drainage at 3-5 minutes. Left retrograde showed faintly calcified 7 x 12 mm density above the left UPJ. A guidewire was advanced to the upper pole calices. A 6 French 26-cm double-J stent with string attached was advanced over the guidewire with excellent pigtail formation on guidewire removal. The bladder was drained. The cystoscope was removed. The patient was returned to recovery. Dictated by:   Verl Dicker, M.D. Attending Physician:  Ellwood Handler DD:   03/20/02 TD:  03/22/02 Job: [id number]

## 2011-05-20 LAB — POCT HEMOGLOBIN-HEMACUE: Operator id: 268271

## 2011-06-15 LAB — PREGNANCY, URINE: Preg Test, Ur: NEGATIVE

## 2011-06-15 LAB — CBC
HCT: 31.7 — ABNORMAL LOW
HCT: 32.9 — ABNORMAL LOW
HCT: 32.6 — ABNORMAL LOW
HCT: 36.8
Hemoglobin: 11 — ABNORMAL LOW
Hemoglobin: 11.3 — ABNORMAL LOW
MCHC: 33.5
MCHC: 34.4
MCHC: 34.8
MCV: 83.9
MCV: 85
MCV: 84.5
Platelets: 293
Platelets: 366
Platelets: 253
Platelets: 315
RBC: 3.78 — ABNORMAL LOW
RBC: 3.87
RBC: 3.85 — ABNORMAL LOW
RDW: 13.6
RDW: 13.8
RDW: 14
WBC: 11.9 — ABNORMAL HIGH
WBC: 11.5 — ABNORMAL HIGH
WBC: 7.8

## 2011-06-15 LAB — BASIC METABOLIC PANEL
BUN: 4 — ABNORMAL LOW
BUN: 13
BUN: 4 — ABNORMAL LOW
CO2: 31
CO2: 26
CO2: 28
Calcium: 8.4
Calcium: 8.6
Calcium: 8.4
Calcium: 8.9
Chloride: 106
Chloride: 107
Creatinine, Ser: 0.53
Creatinine, Ser: 0.62
GFR calc Af Amer: >60
GFR calc Af Amer: >60
GFR calc non Af Amer: >60
GFR calc non Af Amer: >60
GFR calc non Af Amer: >60
GFR calc non Af Amer: >60
Glucose, Bld: 124 — ABNORMAL HIGH
Glucose, Bld: 131 — ABNORMAL HIGH
Glucose, Bld: 100 — ABNORMAL HIGH
Glucose, Bld: 113 — ABNORMAL HIGH
Potassium: 3.5
Potassium: 3.8
Potassium: 3.8
Sodium: 139
Sodium: 140

## 2011-06-15 LAB — CLOSTRIDIUM DIFFICILE EIA

## 2011-06-15 LAB — DIFFERENTIAL
Basophils Absolute: 0
Eosinophils Relative: 3
Lymphocytes Relative: 24
Lymphs Abs: 1.8
Neutro Abs: 5
Neutrophils Relative %: 65

## 2011-06-18 ENCOUNTER — Telehealth: Payer: Self-pay | Admitting: *Deleted

## 2011-06-18 NOTE — Telephone Encounter (Signed)
Patient called c/o upper breast pain into [facility].  Seams more like a burning sensation to her.  No lumps.  Will watch caffeine and use Motrin.  Has appointment scheduled on Monday just to check breast tissue.  Can't come into the office today or tomorrow.

## 2011-06-19 ENCOUNTER — Encounter: Payer: Self-pay | Admitting: *Deleted

## 2011-06-19 DIAGNOSIS — D126 Benign neoplasm of colon, unspecified: Secondary | ICD-10-CM | POA: Insufficient documentation

## 2011-06-22 ENCOUNTER — Ambulatory Visit: Payer: 59 | Admitting: Women's Health

## 2012-06-14 ENCOUNTER — Encounter: Payer: Self-pay | Admitting: Women's Health

## 2012-06-16 ENCOUNTER — Other Ambulatory Visit: Payer: Self-pay | Admitting: *Deleted

## 2012-06-16 DIAGNOSIS — R928 Other abnormal and inconclusive findings on diagnostic imaging of breast: Secondary | ICD-10-CM

## 2012-06-21 ENCOUNTER — Other Ambulatory Visit: Payer: Self-pay | Admitting: Women's Health

## 2012-06-21 DIAGNOSIS — R928 Other abnormal and inconclusive findings on diagnostic imaging of breast: Secondary | ICD-10-CM

## 2012-09-23 ENCOUNTER — Ambulatory Visit (INDEPENDENT_AMBULATORY_CARE_PROVIDER_SITE_OTHER): Payer: 59 | Admitting: Women's Health

## 2012-09-23 ENCOUNTER — Encounter: Payer: Self-pay | Admitting: Women's Health

## 2012-09-23 VITALS — BP 130/71 | Ht 63.0 in | Wt 228.0 lb

## 2012-09-23 DIAGNOSIS — Z9889 Other specified postprocedural states: Secondary | ICD-10-CM

## 2012-09-23 DIAGNOSIS — D126 Benign neoplasm of colon, unspecified: Secondary | ICD-10-CM

## 2012-09-23 DIAGNOSIS — N83209 Unspecified ovarian cyst, unspecified side: Secondary | ICD-10-CM

## 2012-09-23 DIAGNOSIS — Z780 Asymptomatic menopausal state: Secondary | ICD-10-CM

## 2012-09-23 DIAGNOSIS — Z01419 Encounter for gynecological examination (general) (routine) without abnormal findings: Secondary | ICD-10-CM

## 2012-09-23 HISTORY — DX: Other specified postprocedural states: Z98.890

## 2012-09-23 NOTE — Progress Notes (Signed)
[name] 1956-03-06 [id number]    History:    The patient presents for annual exam.  Postmenopausal/ no HRT/ no bleeding. History of a high-grade dysplastic polyp/tumor, colon resection with temporary colostomy 2008 reversed 2009. April 2012 colonoscopy showed a tubular adenoma with no high-grade dysplasia or malignancy noted. History of an endometrial ablation with myomectomy and polypectomy 07/2000. History of normal Paps, last Pap 11/2010. Normal mammograms. Had CT scan December 2013 for kidney stones that showed ovarian cyst.   Past medical history, past surgical history, family history and social history were all reviewed and documented in the EPIC chart. Dog groomer.  Appendectomy 1982, Cholecystectomy 1987. C-sections 87, 89, 93 with BTL. [name], [name], [name], all doing well   ROS:  A  ROS was performed and pertinent positives and negatives are included in the history.  Exam:  Filed Vitals:   09/23/12 1414  BP: 130/71    General appearance:  Normal Head/Neck:  Normal, without cervical or supraclavicular adenopathy. Thyroid:  Symmetrical, normal in size, without palpable masses or nodularity. Respiratory  Effort:  Normal  Auscultation:  Clear without wheezing or rhonchi Cardiovascular  Auscultation:  Regular rate, without rubs, murmurs or gallops  Edema/varicosities:  Not grossly evident Abdominal  Soft,nontender, without masses, guarding or rebound.  Liver/spleen:  No organomegaly noted  Hernia:  None appreciated  Skin  Inspection:  Grossly normal  Palpation:  Grossly normal Neurologic/psychiatric  Orientation:  Normal with appropriate conversation.  Mood/affect:  Normal  Genitourinary    Breasts: Examined lying and sitting.     Right: Without masses, retractions, discharge or axillary adenopathy.     Left: Without masses, retractions, discharge or axillary adenopathy.   Inguinal/mons:  Normal without inguinal adenopathy  External genitalia:   Normal  BUS/Urethra/Skene's glands:  Normal  Bladder:  Normal  Vagina:  Normal  Cervix:  Normal stenotic  Uterus:   normal in size, shape and contour.  Midline and mobile  Adnexa/parametria:     Rt: Without masses or tenderness.   Lt: Without masses or tenderness.  Anus and perineum: Normal  Digital rectal exam: Normal sphincter tone without palpated masses or tenderness  Assessment/Plan:  57 y.o. M. WF G3 P3 for annual exam with no complaints.    CT scan-ovarian cyst 07/2012 History of high-grade tumor -Colon resection with colostomy2008 with reversal 2009, benign colon polyp 11/2010-[name]  Plan: Schedule bone density and pelvic ultrasound here. Reviewed ultrasound better method to assess ovaries. SBE's, continue annual mammogram. Reviewed importance of increasing regular exercise and decreasing calories for continued weight loss. Had numerous labs with recent hospitalization for kidney stones. Increase calcium rich foods and diet, vitamin D 2000 daily encouraged. Continue followup with gastroenterologist. Pap only. Normal Pap April 2012, (does not always come annually).     [name] [name], 3:59 PM 09/23/2012

## 2012-09-23 NOTE — Assessment & Plan Note (Signed)
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

## 2012-09-23 NOTE — Patient Instructions (Addendum)

## 2012-09-26 ENCOUNTER — Other Ambulatory Visit (HOSPITAL_COMMUNITY)
Admission: RE | Admit: 2012-09-26 | Discharge: 2012-09-26 | Disposition: A | Discharge status: Home / Self Care | Payer: 59 | Source: Ambulatory Visit | Attending: Women's Health | Admitting: Women's Health

## 2012-09-26 DIAGNOSIS — Z1151 Encounter for screening for human papillomavirus (HPV): Secondary | ICD-10-CM | POA: Insufficient documentation

## 2012-09-26 DIAGNOSIS — Z01419 Encounter for gynecological examination (general) (routine) without abnormal findings: Secondary | ICD-10-CM | POA: Insufficient documentation

## 2012-09-26 NOTE — Addendum Note (Signed)
Addended by: Richardson Chiquito on: 09/26/2012 03:55 PM   Modules accepted: Orders

## 2012-09-27 ENCOUNTER — Ambulatory Visit: Payer: 59 | Admitting: Gynecology

## 2012-10-03 ENCOUNTER — Ambulatory Visit (INDEPENDENT_AMBULATORY_CARE_PROVIDER_SITE_OTHER): Payer: 59

## 2012-10-03 ENCOUNTER — Ambulatory Visit (INDEPENDENT_AMBULATORY_CARE_PROVIDER_SITE_OTHER): Payer: 59 | Admitting: Women's Health

## 2012-10-03 ENCOUNTER — Other Ambulatory Visit: Payer: Self-pay | Admitting: Gynecology

## 2012-10-03 ENCOUNTER — Encounter: Payer: Self-pay | Admitting: Women's Health

## 2012-10-03 DIAGNOSIS — D252 Subserosal leiomyoma of uterus: Secondary | ICD-10-CM

## 2012-10-03 DIAGNOSIS — N949 Unspecified condition associated with female genital organs and menstrual cycle: Secondary | ICD-10-CM

## 2012-10-03 DIAGNOSIS — N200 Calculus of kidney: Secondary | ICD-10-CM | POA: Insufficient documentation

## 2012-10-03 DIAGNOSIS — D126 Benign neoplasm of colon, unspecified: Secondary | ICD-10-CM

## 2012-10-03 DIAGNOSIS — N959 Unspecified menopausal and perimenopausal disorder: Secondary | ICD-10-CM

## 2012-10-03 DIAGNOSIS — N83209 Unspecified ovarian cyst, unspecified side: Secondary | ICD-10-CM

## 2012-10-03 DIAGNOSIS — D251 Intramural leiomyoma of uterus: Secondary | ICD-10-CM

## 2012-10-03 DIAGNOSIS — R102 Pelvic and perineal pain: Secondary | ICD-10-CM

## 2012-10-03 DIAGNOSIS — Z780 Asymptomatic menopausal state: Secondary | ICD-10-CM

## 2012-10-03 DIAGNOSIS — D259 Leiomyoma of uterus, unspecified: Secondary | ICD-10-CM

## 2012-10-03 NOTE — Patient Instructions (Addendum)
Ovarian Cyst The ovaries are small organs that are on each side of the uterus. The ovaries are the organs that produce the female hormones, estrogen and progesterone. An ovarian cyst is a sac filled with fluid that can vary in its size. It is normal for a small cyst to form in women who are in the childbearing age and who have menstrual periods. This type of cyst is called a follicle cyst that becomes an ovulation cyst (corpus luteum cyst) after it produces the women's egg. It later goes away on its own if the woman does not become pregnant. There are other kinds of ovarian cysts that may cause problems and may need to be treated. The most serious problem is a cyst with cancer. It should be noted that menopausal women who have an ovarian cyst are at a higher risk of it being a cancer cyst. They should be evaluated very quickly, thoroughly and followed closely. This is especially true in menopausal women because of the high rate of ovarian cancer in women in menopause. CAUSES AND TYPES OF OVARIAN CYSTS:  FUNCTIONAL CYST: The follicle/corpus luteum cyst is a functional cyst that occurs every month during ovulation with the menstrual cycle. They go away with the next menstrual cycle if the woman does not get pregnant. Usually, there are no symptoms with a functional cyst.  ENDOMETRIOMA CYST: This cyst develops from the lining of the uterus tissue. This cyst gets in or on the ovary. It grows every month from the bleeding during the menstrual period. It is also called a "chocolate cyst" because it becomes filled with blood that turns brown. This cyst can cause pain in the lower abdomen during intercourse and with your menstrual period.  CYSTADENOMA CYST: This cyst develops from the cells on the outside of the ovary. They usually are not cancerous. They can get very big and cause lower abdomen pain and pain with intercourse. This type of cyst can twist on itself, cut off its blood supply and cause severe pain. It  also can easily rupture and cause a lot of pain.  DERMOID CYST: This type of cyst is sometimes found in both ovaries. They are found to have different kinds of body tissue in the cyst. The tissue includes skin, teeth, hair, and/or cartilage. They usually do not have symptoms unless they get very big. Dermoid cysts are rarely cancerous.  POLYCYSTIC OVARY: This is a rare condition with hormone problems that produces many small cysts on both ovaries. The cysts are follicle-like cysts that never produce an egg and become a corpus luteum. It can cause an increase in body weight, infertility, acne, increase in body and facial hair and lack of menstrual periods or rare menstrual periods. Many women with this problem develop type 2 diabetes. The exact cause of this problem is unknown. A polycystic ovary is rarely cancerous.  THECA LUTEIN CYST: Occurs when too much hormone (human chorionic gonadotropin) is produced and over-stimulates the ovaries to produce an egg. They are frequently seen when doctors stimulate the ovaries for invitro-fertilization (test tube babies).  LUTEOMA CYST: This cyst is seen during pregnancy. Rarely it can cause an obstruction to the birth canal during labor and delivery. They usually go away after delivery. SYMPTOMS   Pelvic pain or pressure.  Pain during sexual intercourse.  Increasing girth (swelling) of the abdomen.  Abnormal menstrual periods.  Increasing pain with menstrual periods.  You stop having menstrual periods and you are not pregnant. DIAGNOSIS  The diagnosis can   be made during:  Routine or annual pelvic examination (common).  Ultrasound.  X-ray of the pelvis.  CT Scan.  MRI.  Blood tests. TREATMENT   Treatment may only be to follow the cyst monthly for 2 to 3 months with your caregiver. Many go away on their own, especially functional cysts.  May be aspirated (drained) with a long needle with ultrasound, or by laparoscopy (inserting a tube into  the pelvis through a small incision).  The whole cyst can be removed by laparoscopy.  Sometimes the cyst may need to be removed through an incision in the lower abdomen.  Hormone treatment is sometimes used to help dissolve certain cysts.  Birth control pills are sometimes used to help dissolve certain cysts. HOME CARE INSTRUCTIONS  Follow your caregiver's advice regarding:  Medicine.  Follow up visits to evaluate and treat the cyst.  You may need to come back or make an appointment with another caregiver, to find the exact cause of your cyst, if your caregiver is not a gynecologist.  Get your yearly and recommended pelvic examinations and Pap tests.  Let your caregiver know if you have had an ovarian cyst in the past. SEEK MEDICAL CARE IF:   Your periods are late, irregular, they stop, or are painful.  Your stomach (abdomen) or pelvic pain does not go away.  Your stomach becomes larger or swollen.  You have pressure on your bladder or trouble emptying your bladder completely.  You have painful sexual intercourse.  You have feelings of fullness, pressure, or discomfort in your stomach.  You lose weight for no apparent reason.  You feel generally ill.  You become constipated.  You lose your appetite.  You develop acne.  You have an increase in body and facial hair.  You are gaining weight, without changing your exercise and eating habits.  You think you are pregnant. SEEK IMMEDIATE MEDICAL CARE IF:   You have increasing abdominal pain.  You feel sick to your stomach (nausea) and/or vomit.  You develop a fever that comes on suddenly.  You develop abdominal pain during a bowel movement.  Your menstrual periods become heavier than usual. Document Released: 08/17/2005 Document Revised: 11/09/2011 Document Reviewed: 06/20/2009 Exit[employer] Patient Information 2013 [employer].  

## 2012-10-03 NOTE — Progress Notes (Signed)
Patient ID: [name], female   DOB: Oct 22, 1955, 57 y.o.   MRN: [id number] Presents for ultrasound. History of an ovarian cyst noted on CT scan for kidney stones 07/2012. Postmenopausal on no HRT/no bleeding with no complaints. History of ablation.   Ultrasound: Anteverted uterus with intramural and subserous fibroids 15 x 13 mm, 13 x 12 mm, 17 x 11 mm. Right ovary normal. Left ovary thin-walled echo-free avascular 30 x 29 x 28 mm. Negative cul-de-sac. Endometrium 1.6 mm.  Postmenopausal with left ovarian cyst  Plan: Reviewed ovarian cyst appears benign, will repeat ultrasound in 3 months to check for stability or resolution.

## 2012-10-19 ENCOUNTER — Other Ambulatory Visit: Payer: Self-pay | Admitting: Gynecology

## 2012-10-19 DIAGNOSIS — Z780 Asymptomatic menopausal state: Secondary | ICD-10-CM

## 2012-10-19 DIAGNOSIS — Z13820 Encounter for screening for osteoporosis: Secondary | ICD-10-CM

## 2012-10-25 ENCOUNTER — Ambulatory Visit (INDEPENDENT_AMBULATORY_CARE_PROVIDER_SITE_OTHER): Payer: 59

## 2012-10-25 DIAGNOSIS — M8580 Other specified disorders of bone density and structure, unspecified site: Secondary | ICD-10-CM

## 2012-10-25 DIAGNOSIS — Z13820 Encounter for screening for osteoporosis: Secondary | ICD-10-CM

## 2012-10-25 DIAGNOSIS — M899 Disorder of bone, unspecified: Secondary | ICD-10-CM

## 2013-02-01 ENCOUNTER — Ambulatory Visit: Payer: 59 | Admitting: Women's Health

## 2013-02-01 ENCOUNTER — Other Ambulatory Visit: Payer: 59

## 2013-06-12 ENCOUNTER — Ambulatory Visit (INDEPENDENT_AMBULATORY_CARE_PROVIDER_SITE_OTHER): Payer: 59 | Admitting: Gynecology

## 2013-06-12 ENCOUNTER — Encounter: Payer: Self-pay | Admitting: Gynecology

## 2013-06-12 ENCOUNTER — Telehealth: Payer: Self-pay | Admitting: *Deleted

## 2013-06-12 DIAGNOSIS — R300 Dysuria: Secondary | ICD-10-CM

## 2013-06-12 DIAGNOSIS — N898 Other specified noninflammatory disorders of vagina: Secondary | ICD-10-CM

## 2013-06-12 DIAGNOSIS — N390 Urinary tract infection, site not specified: Secondary | ICD-10-CM

## 2013-06-12 LAB — URINALYSIS W MICROSCOPIC + REFLEX CULTURE
Casts: NONE SEEN
Crystals: NONE SEEN
Glucose, UA: NEGATIVE mg/dL
Nitrite: NEGATIVE
Specific Gravity, Urine: >1.03 — ABNORMAL HIGH (ref 1.005–1.030)
pH: 5 (ref 5.0–8.0)

## 2013-06-12 MED ORDER — CIPROFLOXACIN HCL 250 MG PO TABS: 250.0000 mg | ORAL_TABLET | Freq: Two times a day (BID) | ORAL | Status: DC

## 2013-06-12 NOTE — Progress Notes (Signed)
Patient presents complaining of 2 weeks of on and off dysuria frequency and urgency getting much worse over the last several days. No fever chills nausea vomiting diarrhea constipation. Does have a history of renal lithiasis being followed by Dr. Isabel Caprice. No vaginal discharge itching or odor.  Exam with Blanca Asst. Spine straight no CVA tenderness Abdomen soft nontender without masses guarding rebound organomegaly. Pelvic external BUS vagina with left lateral submucosal vaginal cyst to 3 cm within fingerbreadth of the introitus soft nontender. Cervix normal. Uterus grossly normal midline mobile nontender. Adnexa without masses or tenderness.  Assessment and plan: 1. UTI. Symptoms and urinalysis consistent with UTI. Does have 11-20 RBC. Will cover with ciprofloxacin 250 mg twice a day x7 days. I did recommend she return for a followup urinalysis to make sure the microscopic hematuria clears. If not then she'll need to followup with her urologist. 2. Vaginal cyst. Asymptomatic to the patient. Not noted before on exam. Probable mllerian duct remnant. Recommended reexam in one month for stability. Option to biopsy and drain versus continued observation reviewed and we'll readdress at her one-month followup appointment.

## 2013-06-12 NOTE — Telephone Encounter (Signed)
Pt called and requested [place] call her.

## 2013-06-12 NOTE — Patient Instructions (Signed)
Take antibiotics as prescribed. Repeat urinalysis after finishing antibiotics to make sure that the blood in your urine clears. Followup in one month for reexamination.

## 2013-06-13 NOTE — Telephone Encounter (Signed)
Telephone call, states UTI symptoms are better, but did not disclose yesterday husband unfaithful, transferred to  appointments to schedule test of cure UA and STD check.

## 2013-06-14 ENCOUNTER — Other Ambulatory Visit: Payer: Self-pay | Admitting: Gynecology

## 2013-06-14 DIAGNOSIS — R3129 Other microscopic hematuria: Secondary | ICD-10-CM

## 2013-07-04 ENCOUNTER — Encounter: Payer: Self-pay | Admitting: Women's Health

## 2013-07-04 ENCOUNTER — Ambulatory Visit (INDEPENDENT_AMBULATORY_CARE_PROVIDER_SITE_OTHER): Payer: 59 | Admitting: Women's Health

## 2013-07-04 DIAGNOSIS — R3129 Other microscopic hematuria: Secondary | ICD-10-CM

## 2013-07-04 DIAGNOSIS — Z113 Encounter for screening for infections with a predominantly sexual mode of transmission: Secondary | ICD-10-CM

## 2013-07-04 DIAGNOSIS — B3749 Other urogenital candidiasis: Secondary | ICD-10-CM

## 2013-07-04 LAB — URINALYSIS W MICROSCOPIC + REFLEX CULTURE
Bilirubin Urine: NEGATIVE
Casts: NONE SEEN
Crystals: NONE SEEN
Glucose, UA: NEGATIVE mg/dL
Ketones, ur: NEGATIVE mg/dL
Specific Gravity, Urine: 1.025 (ref 1.005–1.030)
WBC, UA: NONE SEEN WBC/hpf (ref <3)

## 2013-07-04 MED ORDER — FLUCONAZOLE 150 MG PO TABS: 150.0000 mg | ORAL_TABLET | Freq: Once | ORAL | Status: DC

## 2013-07-04 NOTE — Progress Notes (Signed)
Patient ID: [name], female   DOB: February 21, 1956, 57 y.o.   MRN: [id number] Presents for test of cure UA, treated for UTI 06/12/2013 with hematuria. States feels like she passed a kidney stone last week. Denies visible blood in urine. Urinary symptoms resolved. Also was noted to have a 3 cm left vaginal wall cyst,asymptomatic. Denies vaginal discharge, itching, dyspareunia. Postmenopausal with no bleeding on no HRT. Questions husbands fidelity.  Exam: Appears well, UA moderate blood, 7-10 RBCs, few bacteria, yeast present. Speculum exam, 2 cm left wall vaginal cyst noted nontender. No adnexal fullness or tenderness. No visible discharge.  Hematuria  Yeast Left wall vaginal cyst/asymptomatic  Plan: Diflucan 150 by mouth times one dose. Options reviewed to drain, biopsy, watch cyst, appears to be smaller, asymptomatic, would prefer to just watch at this time. Urine culture pending. Recheck clean-catch UA in one month. GC/Chlamydia culture pending on urine. Denies need for HIV, hepatitis, RPR.

## 2013-07-07 ENCOUNTER — Other Ambulatory Visit: Payer: Self-pay | Admitting: *Deleted

## 2013-07-07 DIAGNOSIS — R319 Hematuria, unspecified: Secondary | ICD-10-CM

## 2013-07-14 ENCOUNTER — Ambulatory Visit: Payer: 59 | Admitting: Gynecology

## 2013-09-25 ENCOUNTER — Encounter: Payer: 59 | Admitting: Women's Health

## 2013-09-26 ENCOUNTER — Ambulatory Visit (INDEPENDENT_AMBULATORY_CARE_PROVIDER_SITE_OTHER): Payer: 59 | Admitting: Women's Health

## 2013-09-26 ENCOUNTER — Other Ambulatory Visit (HOSPITAL_COMMUNITY)
Admission: RE | Admit: 2013-09-26 | Discharge: 2013-09-26 | Disposition: A | Discharge status: Home / Self Care | Payer: 59 | Source: Ambulatory Visit | Attending: Gynecology | Admitting: Gynecology

## 2013-09-26 ENCOUNTER — Encounter: Payer: Self-pay | Admitting: Women's Health

## 2013-09-26 VITALS — BP 108/70 | Ht 62.75 in | Wt 193.0 lb

## 2013-09-26 DIAGNOSIS — Z01419 Encounter for gynecological examination (general) (routine) without abnormal findings: Secondary | ICD-10-CM | POA: Insufficient documentation

## 2013-09-26 DIAGNOSIS — Z13220 Encounter for screening for lipoid disorders: Secondary | ICD-10-CM

## 2013-09-26 DIAGNOSIS — N83209 Unspecified ovarian cyst, unspecified side: Secondary | ICD-10-CM

## 2013-09-26 DIAGNOSIS — E079 Disorder of thyroid, unspecified: Secondary | ICD-10-CM

## 2013-09-26 DIAGNOSIS — Z833 Family history of diabetes mellitus: Secondary | ICD-10-CM

## 2013-09-26 LAB — LIPID PANEL
CHOLESTEROL: 200 mg/dL (ref 0–200)
HDL: 61 mg/dL (ref >39)
LDL CALC: 126 mg/dL — AB (ref 0–99)
TRIGLYCERIDES: 65 mg/dL (ref <150)
Total CHOL/HDL Ratio: 3.3 Ratio
VLDL: 13 mg/dL (ref 0–40)

## 2013-09-26 LAB — CBC WITH DIFFERENTIAL/PLATELET
BASOS ABS: 0 10*3/uL (ref 0.0–0.1)
BASOS PCT: 1 % (ref 0–1)
Eosinophils Absolute: 0.1 10*3/uL (ref 0.0–0.7)
Eosinophils Relative: 2 % (ref 0–5)
HCT: 41.4 % (ref 36.0–46.0)
Hemoglobin: 14 g/dL (ref 12.0–15.0)
LYMPHS PCT: 38 % (ref 12–46)
Lymphs Abs: 2.4 10*3/uL (ref 0.7–4.0)
MCH: 29.6 pg (ref 26.0–34.0)
MCHC: 33.8 g/dL (ref 30.0–36.0)
MCV: 87.5 fL (ref 78.0–100.0)
Monocytes Absolute: 0.6 10*3/uL (ref 0.1–1.0)
Monocytes Relative: 9 % (ref 3–12)
NEUTROS ABS: 3.1 10*3/uL (ref 1.7–7.7)
Neutrophils Relative %: 50 % (ref 43–77)
PLATELETS: 279 10*3/uL (ref 150–400)
RBC: 4.73 MIL/uL (ref 3.87–5.11)
RDW: 13.9 % (ref 11.5–15.5)
WBC: 6.2 10*3/uL (ref 4.0–10.5)

## 2013-09-26 LAB — GLUCOSE, RANDOM: Glucose, Bld: 96 mg/dL (ref 70–99)

## 2013-09-26 LAB — TSH: TSH: 1.172 u[IU]/mL (ref 0.350–4.500)

## 2013-09-26 NOTE — Patient Instructions (Signed)
Health Recommendations for Postmenopausal Women Respected and ongoing research has looked at the most common causes of death, disability, and poor quality of life in postmenopausal women. The causes include heart disease, diseases of blood vessels, diabetes, depression, cancer, and bone loss (osteoporosis). Many things can be done to help lower the chances of developing these and other common problems: CARDIOVASCULAR DISEASE Heart Disease: A heart attack is a medical emergency. Know the signs and symptoms of a heart attack. Below are things women can do to reduce their risk for heart disease.   Do not smoke. If you smoke, quit.  Aim for a healthy weight. Being overweight causes many preventable deaths. Eat a healthy and balanced diet and drink an adequate amount of liquids.  Get moving. Make a commitment to be more physically active. Aim for 30 minutes of activity on most, if not all days of the week.  Eat for heart health. Choose a diet that is low in saturated fat and cholesterol and eliminate trans fat. Include whole grains, vegetables, and fruits. Read and understand the labels on food containers before buying.  Know your numbers. Ask your caregiver to check your blood pressure, cholesterol (total, HDL, LDL, triglycerides) and blood glucose. Work with your caregiver on improving your entire clinical picture.  High blood pressure. Limit or stop your table salt intake (try salt substitute and food seasonings). Avoid salty foods and drinks. Read labels on food containers before buying. Eating well and exercising can help control high blood pressure. STROKE  Stroke is a medical emergency. Stroke may be the result of a blood clot in a blood vessel in the brain or by a brain hemorrhage (bleeding). Know the signs and symptoms of a stroke. To lower the risk of developing a stroke:  Avoid fatty foods.  Quit smoking.  Control your diabetes, blood pressure, and irregular heart rate. THROMBOPHLEBITIS  (BLOOD CLOT) OF THE LEG  Becoming overweight and leading a stationary lifestyle may also contribute to developing blood clots. Controlling your diet and exercising will help lower the risk of developing blood clots. CANCER SCREENING  Breast Cancer: Take steps to reduce your risk of breast cancer.  You should practice "breast self-awareness." This means understanding the normal appearance and feel of your breasts and should include breast self-examination. Any changes detected, no matter how small, should be reported to your caregiver.  After age 40, you should have a clinical breast exam (CBE) every year.  Starting at age 40, you should consider having a mammogram (breast X-ray) every year.  If you have a family history of breast cancer, talk to your caregiver about genetic screening.  If you are at high risk for breast cancer, talk to your caregiver about having an MRI and a mammogram every year.  Intestinal or Stomach Cancer: Tests to consider are a rectal exam, fecal occult blood, sigmoidoscopy, and colonoscopy. Women who are high risk may need to be screened at an earlier age and more often.  Cervical Cancer:  Beginning at age 30, you should have a Pap test every 3 years as long as the past 3 Pap tests have been normal.  If you have had past treatment for cervical cancer or a condition that could lead to cancer, you need Pap tests and screening for cancer for at least 20 years after your treatment.  If you had a hysterectomy for a problem that was not cancer or a condition that could lead to cancer, then you no longer need Pap tests.    If you are between ages 65 and 70, and you have had normal Pap tests going back 10 years, you no longer need Pap tests.  If Pap tests have been discontinued, risk factors (such as a new sexual partner) need to be reassessed to determine if screening should be resumed.  Some medical problems can increase the chance of getting cervical cancer. In these  cases, your caregiver may recommend more frequent screening and Pap tests.  Uterine Cancer: If you have vaginal bleeding after reaching menopause, you should notify your caregiver.  Ovarian cancer: Other than yearly pelvic exams, there are no reliable tests available to screen for ovarian cancer at this time except for yearly pelvic exams.  Lung Cancer: Yearly chest X-rays can detect lung cancer and should be done on high risk women, such as cigarette smokers and women with chronic lung disease (emphysema).  Skin Cancer: A complete body skin exam should be done at your yearly examination. Avoid overexposure to the sun and ultraviolet light lamps. Use a strong sun block cream when in the sun. All of these things are important in lowering the risk of skin cancer. MENOPAUSE Menopause Symptoms: Hormone therapy products are effective for treating symptoms associated with menopause:  Moderate to severe hot flashes.  Night sweats.  Mood swings.  Headaches.  Tiredness.  Loss of sex drive.  Insomnia.  Other symptoms. Hormone replacement carries certain risks, especially in older women. Women who use or are thinking about using estrogen or estrogen with progestin treatments should discuss that with their caregiver. Your caregiver will help you understand the benefits and risks. The ideal dose of hormone replacement therapy is not known. The Food and Drug Administration (FDA) has concluded that hormone therapy should be used only at the lowest doses and for the shortest amount of time to reach treatment goals.  OSTEOPOROSIS Protecting Against Bone Loss and Preventing Fracture: If you use hormone therapy for prevention of bone loss (osteoporosis), the risks for bone loss must outweigh the risk of the therapy. Ask your caregiver about other medications known to be safe and effective for preventing bone loss and fractures. To guard against bone loss or fractures, the following is recommended:  If  you are less than age 50, take 1000 mg of calcium and at least 600 mg of Vitamin D per day.  If you are greater than age 50 but less than age 70, take 1200 mg of calcium and at least 600 mg of Vitamin D per day.  If you are greater than age 70, take 1200 mg of calcium and at least 800 mg of Vitamin D per day. Smoking and excessive alcohol intake increases the risk of osteoporosis. Eat foods rich in calcium and vitamin D and do weight bearing exercises several times a week as your caregiver suggests. DIABETES Diabetes Melitus: If you have Type I or Type 2 diabetes, you should keep your blood sugar under control with diet, exercise and recommended medication. Avoid too many sweets, starchy and fatty foods. Being overweight can make control more difficult. COGNITION AND MEMORY Cognition and Memory: Menopausal hormone therapy is not recommended for the prevention of cognitive disorders such as Alzheimer's disease or memory loss.  DEPRESSION  Depression may occur at any age, but is common in elderly women. The reasons may be because of physical, medical, social (loneliness), or financial problems and needs. If you are experiencing depression because of medical problems and control of symptoms, talk to your caregiver about this. Physical activity and   exercise may help with mood and sleep. Community and volunteer involvement may help your sense of value and worth. If you have depression and you feel that the problem is getting worse or becoming severe, talk to your caregiver about treatment options that are best for you. ACCIDENTS  Accidents are common and can be serious in the elderly woman. Prepare your house to prevent accidents. Eliminate throw rugs, place hand bars in the bath, shower and toilet areas. Avoid wearing high heeled shoes or walking on wet, snowy, and icy areas. Limit or stop driving if you have vision or hearing problems, or you feel you are unsteady with you movements and  reflexes. HEPATITIS C Hepatitis C is a type of viral infection affecting the liver. It is spread mainly through contact with blood from an infected person. It can be treated, but if left untreated, it can lead to severe liver damage over years. Many people who are infected do not know that the virus is in their blood. If you are a "baby-boomer", it is recommended that you have one screening test for Hepatitis C. IMMUNIZATIONS  Several immunizations are important to consider having during your senior years, including:   Tetanus, diptheria, and pertussis booster shot.  Influenza every year before the flu season begins.  Pneumonia vaccine.  Shingles vaccine.  Others as indicated based on your specific needs. Talk to your caregiver about these. Document Released: 10/09/2005 Document Revised: 08/03/2012 Document Reviewed: 06/04/2008 [employer] Patient Information 2014 [employer].  

## 2013-09-26 NOTE — Progress Notes (Signed)
[name] 09/22/1955 [id number]    History:    Presents for annual exam.  Postmenopausal on no HRT. Normal Paps and mammograms. 2001 endometrial ablation/polypectomy/myomectomy. 2008 high-grade dysplastic polyp/tumor temporary colostomy in 2008, reversed 2009. 1 benign colon polyp 2012. 10/2012 T score -1.8 left femoral neck, FRAX 6.9%/0.57%.  Past medical history, past surgical history, family history and social history were all reviewed and documented in the EPIC chart. Dog groomer. History of kidney stones. 10/2012 Small fibroids, persistent thin-walled 3 cm ovarian cyst. 3 children all doing well [name], [place], [place].  ROS:  A  ROS was performed and pertinent positives and negatives are included.  Exam:  Filed Vitals:   09/26/13 0842  BP: 108/70    General appearance:  Normal Thyroid:  Symmetrical, normal in size, without palpable masses or nodularity. Respiratory  Auscultation:  Clear without wheezing or rhonchi Cardiovascular  Auscultation:  Regular rate, without rubs, murmurs or gallops  Edema/varicosities:  Not grossly evident Abdominal  Soft,nontender, without masses, guarding or rebound.  Liver/spleen:  No organomegaly noted  Hernia:  None appreciated  Skin  Inspection:  Grossly normal   Breasts: Examined lying and sitting.     Right: Without masses, retractions, discharge or axillary adenopathy.     Left: Without masses, retractions, discharge or axillary adenopathy. Gentitourinary   Inguinal/mons:  Normal without inguinal adenopathy  External genitalia:  Normal  BUS/Urethra/Skene's glands:  Normal  Vagina:  Normal  Cervix:  Normal  Uterus:   normal in size, shape and contour.  Midline and mobile  Adnexa/parametria:     Rt: Without masses or tenderness.   Lt: Without masses or tenderness.  Anus and perineum: Normal  Digital rectal exam: Normal sphincter tone without palpated masses or tenderness  Assessment/Plan:  58 y.o. MWF G3P57for annual exam.      Postmenopausal on no HRT/no bleeding Persistent ovarian cyst History of high-grade dysplastic colon polyp 2008 Osteopenia  Plan: Repeat ultrasound. Instructed to schedule. SBE's, continue annual mammogram, exercise, calcium rich diet, vitamin D 2000 daily. Home safety and fall prevention discussed. CBC, glucose, lipid panel, UA, Pap. Pap normal 2012, new screening guidelines reviewed. Congratulated on 35 pound weight loss with diet and exercise.    [name] WHNP, 1:09 PM 09/26/2013

## 2013-10-13 ENCOUNTER — Other Ambulatory Visit: Payer: Self-pay | Admitting: Women's Health

## 2013-10-13 ENCOUNTER — Encounter: Payer: Self-pay | Admitting: Women's Health

## 2013-10-13 ENCOUNTER — Ambulatory Visit (INDEPENDENT_AMBULATORY_CARE_PROVIDER_SITE_OTHER): Payer: 59

## 2013-10-13 ENCOUNTER — Ambulatory Visit (INDEPENDENT_AMBULATORY_CARE_PROVIDER_SITE_OTHER): Payer: 59 | Admitting: Women's Health

## 2013-10-13 VITALS — BP 120/82

## 2013-10-13 DIAGNOSIS — N949 Unspecified condition associated with female genital organs and menstrual cycle: Secondary | ICD-10-CM

## 2013-10-13 DIAGNOSIS — N83209 Unspecified ovarian cyst, unspecified side: Secondary | ICD-10-CM

## 2013-10-13 DIAGNOSIS — D259 Leiomyoma of uterus, unspecified: Secondary | ICD-10-CM

## 2013-10-13 NOTE — Patient Instructions (Signed)

## 2013-10-13 NOTE — Progress Notes (Signed)
Patient ID: [name], female   DOB: 10-22-1955, 58 y.o.   MRN: [id number] Presents for followup ultrasound. Postmenopausal/no bleeding/no HRT amenorrheic since 06/2011 ablation 2005. History of dysplastic colon polyp with temporary colostomy 2008 reversed in 2009. CT scan 2014 showed left ovarian cyst. 10/2012 ultrasound thin-walled avascular 30 x 29 x 29 mean 29.3 mm. Denies abdominal pain, bloating, nausea.  Ultrasound: Multiple small fibroids seen, stable, 12 x 11 mm, 13 x 11 mm, 14 x 13 mm, 13 x 7 mm. Right ovarian echo-free thin-walled avascular cyst 8 mm. Left ovarian echo-free thin-walled avascular cystic mass 41 x 34 x 29 mm mean 34.6. No free fluid.  Persistent asymptomatic  left ovarian cyst  Plan: CA 125, if negative she  repeat ultrasound 3 months. Ultrasound reviewed and plan made with Dr. Phineas Real

## 2013-10-14 LAB — CA 125: CA 125: 3.4 U/mL (ref 0.0–30.2)

## 2014-06-15 ENCOUNTER — Other Ambulatory Visit: Payer: Self-pay

## 2014-07-02 ENCOUNTER — Encounter: Payer: Self-pay | Admitting: Women's Health

## 2015-04-25 ENCOUNTER — Ambulatory Visit (INDEPENDENT_AMBULATORY_CARE_PROVIDER_SITE_OTHER): Payer: BLUE CROSS/BLUE SHIELD | Admitting: Women's Health

## 2015-04-25 ENCOUNTER — Encounter: Payer: Self-pay | Admitting: Women's Health

## 2015-04-25 VITALS — BP 128/80 | Ht 62.0 in | Wt 211.0 lb

## 2015-04-25 DIAGNOSIS — N8320 Unspecified ovarian cysts: Secondary | ICD-10-CM | POA: Diagnosis not present

## 2015-04-25 DIAGNOSIS — Z01419 Encounter for gynecological examination (general) (routine) without abnormal findings: Secondary | ICD-10-CM

## 2015-04-25 DIAGNOSIS — M8580 Other specified disorders of bone density and structure, unspecified site: Secondary | ICD-10-CM

## 2015-04-25 DIAGNOSIS — N83202 Unspecified ovarian cyst, left side: Secondary | ICD-10-CM

## 2015-04-25 DIAGNOSIS — Z13820 Encounter for screening for osteoporosis: Secondary | ICD-10-CM

## 2015-04-25 LAB — COMPREHENSIVE METABOLIC PANEL WITH GFR
ALT: 18 U/L (ref 6–29)
AST: 19 U/L (ref 10–35)
Albumin: 4.3 g/dL (ref 3.6–5.1)
Alkaline Phosphatase: 64 U/L (ref 33–130)
BUN: 18 mg/dL (ref 7–25)
CO2: 25 mmol/L (ref 20–31)
Calcium: 9.4 mg/dL (ref 8.6–10.4)
Chloride: 105 mmol/L (ref 98–110)
Creat: 0.67 mg/dL (ref 0.50–1.05)
Glucose, Bld: 77 mg/dL (ref 65–99)
Potassium: 4.1 mmol/L (ref 3.5–5.3)
Sodium: 145 mmol/L (ref 135–146)
Total Bilirubin: 0.7 mg/dL (ref 0.2–1.2)
Total Protein: 6.8 g/dL (ref 6.1–8.1)

## 2015-04-25 LAB — CBC WITH DIFFERENTIAL/PLATELET
Basophils Absolute: 0.1 K/uL (ref 0.0–0.1)
Basophils Relative: 1 % (ref 0–1)
Eosinophils Absolute: 0.1 K/uL (ref 0.0–0.7)
Eosinophils Relative: 2 % (ref 0–5)
HCT: 40.7 % (ref 36.0–46.0)
Hemoglobin: 13.6 g/dL (ref 12.0–15.0)
Lymphocytes Relative: 35 % (ref 12–46)
Lymphs Abs: 2.2 K/uL (ref 0.7–4.0)
MCH: 29.2 pg (ref 26.0–34.0)
MCHC: 33.4 g/dL (ref 30.0–36.0)
MCV: 87.3 fL (ref 78.0–100.0)
MPV: 8.9 fL (ref 8.6–12.4)
Monocytes Absolute: 0.6 K/uL (ref 0.1–1.0)
Monocytes Relative: 10 % (ref 3–12)
Neutro Abs: 3.3 K/uL (ref 1.7–7.7)
Neutrophils Relative %: 52 % (ref 43–77)
Platelets: 248 K/uL (ref 150–400)
RBC: 4.66 MIL/uL (ref 3.87–5.11)
RDW: 13.5 % (ref 11.5–15.5)
WBC: 6.4 K/uL (ref 4.0–10.5)

## 2015-04-25 LAB — LIPID PANEL
CHOLESTEROL: 203 mg/dL — AB (ref 125–200)
HDL: 63 mg/dL (ref >=46)
LDL Cholesterol: 126 mg/dL (ref <130)
TRIGLYCERIDES: 70 mg/dL (ref <150)
Total CHOL/HDL Ratio: 3.2 Ratio (ref <=5.0)
VLDL: 14 mg/dL (ref <30)

## 2015-04-25 LAB — TSH: TSH: 1.181 u[IU]/mL (ref 0.350–4.500)

## 2015-04-25 NOTE — Patient Instructions (Signed)

## 2015-04-25 NOTE — Progress Notes (Signed)
[name] [birth year] [id number]    History:    Presents for annual exam.  Postmenopausal/no HRT/no bleeding. Normal Pap and mammogram history. History of a persistent 3-4 cm ovarian cyst negative Ca-125. 4 small fibroids. 12-22-06 high-grade dysplastic polyp-colostomy was reversed in 22-Dec-2007, benign polyp colonoscopy 12-22-2010 has follow-up scheduled 12-22-2015 for repeat colonoscopy. 12-21-12 DEXA T score -1.8 at femoral neck FRAX 6.9%/0.57%. Sister died of ovarian cancer 2014-12-22.Marland Kitchen  Past medical history, past surgical history, family history and social history were all reviewed and documented in the EPIC chart. Dog groomer. History of kidney stones. 3 children, [place] has Mediterranean fever.   ROS:  A ROS was performed and pertinent positives and negatives are included.  Exam:  Filed Vitals:   04/25/15 1200  BP: 128/80    General appearance:  Normal Thyroid:  Symmetrical, normal in size, without palpable masses or nodularity. Respiratory  Auscultation:  Clear without wheezing or rhonchi Cardiovascular  Auscultation:  Regular rate, without rubs, murmurs or gallops  Edema/varicosities:  Not grossly evident Abdominal  Soft,nontender, without masses, guarding or rebound.  Liver/spleen:  No organomegaly noted  Hernia:  None appreciated  Skin  Inspection:  Grossly normal   Breasts: Examined lying and sitting.     Right: Without masses, retractions, discharge or axillary adenopathy.     Left: Without masses, retractions, discharge or axillary adenopathy. Gentitourinary   Inguinal/mons:  Normal without inguinal adenopathy  External genitalia:  Normal  BUS/Urethra/Skene's glands:  Normal  Vagina:  Normal  Cervix:  Normal  Uterus:  normal in size, shape and contour.  Midline and mobile  Adnexa/parametria:     Rt: Without masses or tenderness.   Lt: Without masses or tenderness.  Anus and perineum: Normal  Digital rectal exam: Normal sphincter tone without palpated masses or  tenderness  Assessment/Plan:  59 y.o. MWF G3 P3  for annual exam.   Postmenopausal/no HRT/no bleeding Persistent left ovarian cyst negative Ca 125/sister died of ovarian cancer 22-Dec-2014 Osteopenia without elevated FRAX Obesity  Plan: Repeat ultrasound, instructed to schedule. DEXA repeat will schedule. Reviewed importance of home safety, fall prevention, vitamin D 2000 daily encouraged. Continue active lifestyle, decrease calories for weight loss. SBE's, overdue for mammogram and instructed to schedule. CBC, CMP, lipid panel, vitamin D, TSH, CA-125, UA, Pap normal 12/21/13, new screening guidelines reviewed.   [place], 1:14 PM 04/25/2015

## 2015-04-26 ENCOUNTER — Other Ambulatory Visit: Payer: Self-pay | Admitting: Women's Health

## 2015-04-26 ENCOUNTER — Telehealth: Payer: Self-pay

## 2015-04-26 DIAGNOSIS — R3129 Other microscopic hematuria: Secondary | ICD-10-CM

## 2015-04-26 LAB — URINALYSIS W MICROSCOPIC + REFLEX CULTURE
Bilirubin Urine: NEGATIVE
Casts: NONE SEEN [LPF]
Crystals: NONE SEEN [HPF]
Glucose, UA: NEGATIVE
Ketones, ur: NEGATIVE
Leukocytes, UA: NEGATIVE
Nitrite: POSITIVE — AB
PROTEIN: NEGATIVE
SQUAMOUS EPITHELIAL / LPF: NONE SEEN [HPF] (ref <=5)
Specific Gravity, Urine: 1.013 (ref 1.001–1.035)
YEAST: NONE SEEN [HPF]
pH: 7.5 (ref 5.0–8.0)

## 2015-04-26 LAB — CA 125: CA 125: 6 U/mL (ref <35)

## 2015-04-26 LAB — VITAMIN D 25 HYDROXY (VIT D DEFICIENCY, FRACTURES): Vit D, 25-Hydroxy: 19 ng/mL — ABNORMAL LOW (ref 30–100)

## 2015-04-26 MED ORDER — SULFAMETHOXAZOLE-TRIMETHOPRIM 800-160 MG PO TABS: 1.0000 | ORAL_TABLET | Freq: Two times a day (BID) | ORAL | Status: DC

## 2015-04-26 MED ORDER — VITAMIN D (ERGOCALCIFEROL) 1.25 MG (50000 UNIT) PO CAPS: 50000.0000 [IU] | ORAL_CAPSULE | ORAL | Status: DC

## 2015-04-26 NOTE — Telephone Encounter (Signed)
Thanks, she is self employed, sister recently died of ovarian ca, so do appreciate you explaining your answer, thanks

## 2015-04-26 NOTE — Telephone Encounter (Signed)
-----   Message from [name], NP sent at 04/26/2015  7:48 AM EDT ----- Please call and review urine positive for infection, Septra twice daily for 3 days and will recheck clean-catch UA ultrasound appointment, small amount of blood in urine also. Vitamin D level low, 50,000 weekly for 12 weeks and then vitamin D 2000 over-the-counter daily. Review Ca 125 tumor marker very low, (her sister just died of ovarian cancer) and she does have a persistent ovarian cyst. Lipid panel okay continue to cut calories for weight loss, less than 20 g saturated fat daily. Blood sugar and electrolytes all normal.

## 2015-04-26 NOTE — Telephone Encounter (Signed)
In course of conversation about results patient said she had not scheduled u/s and was not going to be able to do it anytime soon as she has no ins as of yesterday. She said it will be 90 days before she has ins and She asked if okay to wait. I told her that impossible to say okay to wait not knowing if changes in the place on her ovary. If we knew for sure it was nothing it would be okay to wait but no way to know for sure without u/s. We discussed the u/s charge and while she is private pay she will receive a 55% discount on the price. She found this to be affordable and said it was likely something she could do soon and prior to insurance.  She did not schedule today. She said she would need "to figure things out" and she will call back to schedule.

## 2015-04-28 LAB — URINE CULTURE

## 2016-02-17 ENCOUNTER — Ambulatory Visit: Payer: BLUE CROSS/BLUE SHIELD | Admitting: Women's Health

## 2016-02-19 ENCOUNTER — Encounter: Payer: Self-pay | Admitting: Gynecology

## 2016-02-19 ENCOUNTER — Ambulatory Visit (INDEPENDENT_AMBULATORY_CARE_PROVIDER_SITE_OTHER): Payer: Managed Care, Other (non HMO) | Admitting: Gynecology

## 2016-02-19 ENCOUNTER — Ambulatory Visit (INDEPENDENT_AMBULATORY_CARE_PROVIDER_SITE_OTHER): Payer: Managed Care, Other (non HMO)

## 2016-02-19 ENCOUNTER — Other Ambulatory Visit: Payer: Self-pay | Admitting: Gynecology

## 2016-02-19 VITALS — BP 132/88

## 2016-02-19 DIAGNOSIS — Z8041 Family history of malignant neoplasm of ovary: Secondary | ICD-10-CM | POA: Diagnosis not present

## 2016-02-19 DIAGNOSIS — D251 Intramural leiomyoma of uterus: Secondary | ICD-10-CM

## 2016-02-19 DIAGNOSIS — Z86010 Personal history of colonic polyps: Secondary | ICD-10-CM | POA: Diagnosis not present

## 2016-02-19 DIAGNOSIS — Z8742 Personal history of other diseases of the female genital tract: Secondary | ICD-10-CM

## 2016-02-19 DIAGNOSIS — N838 Other noninflammatory disorders of ovary, fallopian tube and broad ligament: Secondary | ICD-10-CM

## 2016-02-19 DIAGNOSIS — N839 Noninflammatory disorder of ovary, fallopian tube and broad ligament, unspecified: Secondary | ICD-10-CM | POA: Diagnosis not present

## 2016-02-19 NOTE — Progress Notes (Addendum)
HPI: Patient is a 60 year old gravida 3 para 3 (3 cesarean section) who presented to the office today discussion of possibly's removing her ovaries at the time of a planned general surgical procedure. 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 She had follow-up colonoscopy is in 2009 in 2012. The anastomosis was reported to have been intact above the anal verge and she had a follow-up colonoscopy with Dr. Benson Norway whereby he detected a polypoid and sessile nonobstructing large mass found in the rectosigmoid colon the mass was described as being non-circumferential. And the mass measured 3 cm in length. This was biopsied by him. At the surgical anastomosis he described also a large polypoid mass. This was 3 cm from the anal verge. This lesion incorporating 40% of the luminal circumference. The pathology report demonstrated that the area biopsied was fragments of a tubovillous adenoma no high-grade dysplasia or malignancy. Patient scheduled to see the general surgeon Dr. Redmond Pulling next week.  Patient has a sister with ovarian cancer. She had an ultrasound here in our office which demonstrated that she had a normal-size uterus was 5 small intramural fibroids less than 1.5 cm in size right ovary was normal and left ovary had a thinwall echo-free cyst avascular measuring 4.1 x 3.4 x 2.9 cm average size 3.4 cm. She had a CA 125 of 3.4 in 2015 and repeat in 2016 had a value of 6. From the GYN standpoint she is having no pelvic pain from this area.  Patient has had also prior cholecystectomy and prior appendectomy, as well as 3 cesarean sections   ROS: A ROS was performed and pertinent positives and negatives are included in the history.  GENERAL: No fevers or chills. HEENT: No change in vision, no earache, sore throat or sinus congestion. NECK: No pain or stiffness. CARDIOVASCULAR: No chest pain or pressure.  No palpitations. PULMONARY: No shortness of breath, cough or wheeze. GASTROINTESTINAL: No abdominal pain, nausea, vomiting or diarrhea, melena or bright red blood per rectum. GENITOURINARY: No urinary frequency, urgency, hesitancy or dysuria. MUSCULOSKELETAL: No joint or muscle pain, no back pain, no recent trauma. DERMATOLOGIC: No rash, no itching, no lesions. ENDOCRINE: No polyuria, polydipsia, no heat or cold intolerance. No recent change in weight. HEMATOLOGICAL: No anemia or easy bruising or bleeding. NEUROLOGIC: No headache, seizures, numbness, tingling or weakness. PSYCHIATRIC: No depression, no loss of interest in normal activity or change in sleep pattern.    Abdomen was pendulous with midline scar as well as right upper quadrant large scar from previous cholecystectomy and also appendectomy scar and cesarean section scars as well as from prior laparotomy/colostomy sites.   Ultrasound today: Uterus measures 6.9 x 5.7 x 3.1 cm with endometrial stripe 1.6 mm. Patient with 5 small intramural myomas the largest one measuring 20 x 22 mm. Right ovary was normal. Left ovary with a thinwall cyst measuring 4.3 x 3.5 x 4.0 cm average size 3.9 cm echo-free avascular echogenic focus 3 mm lateral wall of the cyst is the ovary arterial blood flow was seen. Blood flow was seen to the left ovary. No fluid in the cul-de-sac.  Her previous ultrasound from 2014 and 2015 demonstrated the same cyst along with same features appeared to be benign and the intramural myomas essentially unchanged as well.    Assessment Plan: 60 year old patient with small 3.9 echo-free thinwall left ovarian cyst essentially unchanged in the past 3  years with normal CA 125's. We will check a ROMA-1 ovarian cancer screening today as well. She will receive Depo-Provera 150 mg IM and repeat ultrasound in 3 months. If the general surgeons are planning on doing a laparotomy on her to remove a rectosigmoid mass then we can arrange to operate on  her at the same time and remove both tubes and ovaries. If they do not going to do an abdominal approach because of the patient's multiple abdominal surgeries and the risk of trauma laparoscopically it would be best to just continue to follow since it appears as the cyst is been present for 3 years and has been benign with normal CA 125's. An open laparotomy for a simple appearing cyst which has not changed in 3 years and more than likely benign does not outweigh the risks. I have related this to the patient she fully understands. We will wait to hear from her surgeon Dr. Redmond Pulling when she sees him next week.    Greater than 50% of time was spent in counseling and coordinating care of this patient.   Time of consultation: 15   Minutes.

## 2016-02-19 NOTE — Patient Instructions (Signed)
Ovarian Cyst An ovarian cyst is a fluid-filled sac that forms on an ovary. The ovaries are small organs that produce eggs in women. Various types of cysts can form on the ovaries. Most are not cancerous. Many do not cause problems, and they often go away on their own. Some may cause symptoms and require treatment. Common types of ovarian cysts include:  Functional cysts--These cysts may occur every month during the menstrual cycle. This is normal. The cysts usually go away with the next menstrual cycle if the woman does not get pregnant. Usually, there are no symptoms with a functional cyst.  Endometrioma cysts--These cysts form from the tissue that lines the uterus. They are also called "chocolate cysts" because they become filled with blood that turns brown. This type of cyst can cause pain in the lower abdomen during intercourse and with your menstrual period.  Cystadenoma cysts--This type develops from the cells on the outside of the ovary. These cysts can get very big and cause lower abdomen pain and pain with intercourse. This type of cyst can twist on itself, cut off its blood supply, and cause severe pain. It can also easily rupture and cause a lot of pain.  Dermoid cysts--This type of cyst is sometimes found in both ovaries. These cysts may contain different kinds of body tissue, such as skin, teeth, hair, or cartilage. They usually do not cause symptoms unless they get very big.  Theca lutein cysts--These cysts occur when too much of a certain hormone (human chorionic gonadotropin) is produced and overstimulates the ovaries to produce an egg. This is most common after procedures used to assist with the conception of a baby (in vitro fertilization). CAUSES   Fertility drugs can cause a condition in which multiple large cysts are formed on the ovaries. This is called ovarian hyperstimulation syndrome.  A condition called polycystic ovary syndrome can cause hormonal imbalances that can lead to  nonfunctional ovarian cysts. SIGNS AND SYMPTOMS  Many ovarian cysts do not cause symptoms. If symptoms are present, they may include:  Pelvic pain or pressure.  Pain in the lower abdomen.  Pain during sexual intercourse.  Increasing girth (swelling) of the abdomen.  Abnormal menstrual periods.  Increasing pain with menstrual periods.  Stopping having menstrual periods without being pregnant. DIAGNOSIS  These cysts are commonly found during a routine or annual pelvic exam. Tests may be ordered to find out more about the cyst. These tests may include:  Ultrasound.  X-ray of the pelvis.  CT scan.  MRI.  Blood tests. TREATMENT  Many ovarian cysts go away on their own without treatment. Your health care provider may want to check your cyst regularly for 2-3 months to see if it changes. For women in menopause, it is particularly important to monitor a cyst closely because of the higher rate of ovarian cancer in menopausal women. When treatment is needed, it may include any of the following:  A procedure to drain the cyst (aspiration). This may be done using a long needle and ultrasound. It can also be done through a laparoscopic procedure. This involves using a thin, lighted tube with a tiny camera on the end (laparoscope) inserted through a small incision.  Surgery to remove the whole cyst. This may be done using laparoscopic surgery or an open surgery involving a larger incision in the lower abdomen.  Hormone treatment or birth control pills. These methods are sometimes used to help dissolve a cyst. HOME CARE INSTRUCTIONS   Only take over-the-counter   or prescription medicines as directed by your health care provider.  Follow up with your health care provider as directed.  Get regular pelvic exams and Pap tests. SEEK MEDICAL CARE IF:   Your periods are late, irregular, or painful, or they stop.  Your pelvic pain or abdominal pain does not go away.  Your abdomen becomes  larger or swollen.  You have pressure on your bladder or trouble emptying your bladder completely.  You have pain during sexual intercourse.  You have feelings of fullness, pressure, or discomfort in your stomach.  You lose weight for no apparent reason.  You feel generally ill.  You become constipated.  You lose your appetite.  You develop acne.  You have an increase in body and facial hair.  You are gaining weight, without changing your exercise and eating habits.  You think you are pregnant. SEEK IMMEDIATE MEDICAL CARE IF:   You have increasing abdominal pain.  You feel sick to your stomach (nauseous), and you throw up (vomit).  You develop a fever that comes on suddenly.  You have abdominal pain during a bowel movement.  Your menstrual periods become heavier than usual. MAKE SURE YOU:  Understand these instructions.  Will watch your condition.  Will get help right away if you are not doing well or get worse.   This information is not intended to replace advice given to you by your health care provider. Make sure you discuss any questions you have with your health care provider.   Document Released: 08/17/2005 Document Revised: 08/22/2013 Document Reviewed: 04/24/2013 Elsevier Interactive Patient Education 2016 [employer]  

## 2016-02-20 NOTE — Addendum Note (Signed)
Addended by: [name] on: 02/20/2016 10:06 AM   Modules accepted: Orders

## 2016-02-21 MED ORDER — MEDROXYPROGESTERONE ACETATE 150 MG/ML IM SUSP
150.0000 mg | Freq: Once | INTRAMUSCULAR | Status: AC
  Administered 2016-02-19: 150 mg via INTRAMUSCULAR

## 2016-02-21 NOTE — Addendum Note (Signed)
Addended by: Thurnell Garbe A on: 02/21/2016 08:45 AM   Modules accepted: Orders

## 2016-02-26 LAB — OVARIAN MALIGNANCY RISK-ROMA
CA125: 8 U/mL (ref <35)
HE4: 56 pmol (ref <151)
ROMA POSTMENOPAUSAL: 0.85 (ref <2.77)
ROMA PREMENOPAUSAL: 0.92 (ref <1.31)

## 2016-03-04 ENCOUNTER — Ambulatory Visit: Payer: Self-pay | Admitting: Surgery

## 2016-03-04 NOTE — H&P (Signed)
[name] 03/04/2016 1:29 PM Location: [facility] Patient #: [id number] DOB: February 01, 1956 Married / Language: English / Race: White Female  Patient Care Team: [name], NP as PCP - General (Obstetrics and Gynecology) [name], Mcpherson as Consulting Physician (General Surgery) [name], Mcpherson as Consulting Physician (Gastroenterology) [name], Mcpherson as Consulting Physician (Gynecology)   History of Present Illness [name]; 03/04/2016 6:33 PM) The patient is a 60 year old female who presents with a colonic polyp. Note for "Colonic polyp": Patient sent for surgical consultation at the request of her gastroenterologist, Dr. Beryle Beams with [facility]. Concern for recurrent rectal polyp.  Pleasant active woman. History of polyps. Underwent low anterior resection for bulky colon polyp with high-grade dysplasia in 2007. Acquired vaginal repair & loop ileostomy fecal diversion. Healed. Mild stricture resolved. Ileostomy taken down 2008. There is a report of al laparoscopic repair of a old ileostomy site hernia. She does not recall any of that.  She had follow-up colonoscopy 2009 that was normal. Also 2012. Had 38-year-old follow-up 2017 that showed polyp at the colorectal anastomosis. Primarily posterior/posterior lateral. Biopsies showed adenomatous change. 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 Original surgeon, Dr. Janit Pagan, requested referral to me given my TEM experience with rectal surgery. Patient moves her bowels a couple times a day. Often loose with some urgency. Occasional blood. Hour without difficulty. She does not smoke. Strong family history of ovarian cancer. Has ovarian ultrasounds that show stable ovaries.  No personal nor family history of inflammatory bowel disease, irritable bowel syndrome, allergy such as Celiac Sprue, dietary/dairy problems,  colitis, ulcers nor gastritis. No recent sick contacts/gastroenteritis. No travel outside the country. No changes in diet. No dysphagia to solids or liquids. No significant heartburn or reflux. No hematochezia, hematemesis, coffee ground emesis. No evidence of prior gastric/peptic ulceration.   Other Problems [name], [name]; 03/04/2016 1:29 PM) Arthritis Back Pain Gastroesophageal Reflux Disease  Past Surgical History [name], [name]; 03/04/2016 1:29 PM) Appendectomy Cesarean Section - 1 Colon Polyp Removal - Open Colon Removal - Partial Gallbladder Surgery - Open  Diagnostic Studies History [name], [name]; 03/04/2016 1:29 PM) Colonoscopy within last year Mammogram 1-3 years ago Pap Smear 1-5 years ago  Allergies [name], [name]; 03/04/2016 1:30 PM) Macrobid *URINARY ANTI-INFECTIVES*  Medication History [name], [name]; 03/04/2016 1:30 PM) No Current Medications Medications Reconciled  Social History [name], [name]; 03/04/2016 1:29 PM) Caffeine use Coffee. No alcohol use No drug use Tobacco use Never smoker.  Family History [name], [name]; 03/04/2016 1:29 PM) Bleeding disorder Father. Ischemic Bowel Disease Father. Ovarian Cancer Family Members In General, Sister.  Pregnancy / Birth History [name], [place]; 03/04/2016 1:29 PM) Age at menarche 75 years. Age of menopause 81-50 Contraceptive History Oral contraceptives. Gravida 3 Maternal age 69-30 Para 3 Regular periods     Review of Systems ([place]; 03/04/2016 1:29 PM) General Present- Fatigue and Night Sweats. Not Present- Appetite Loss, Chills, Fever, Weight Gain and Weight Loss. Skin Not Present- Change in Wart/Mole, Dryness, Hives, Jaundice, New Lesions, Non-Healing Wounds, Rash and Ulcer. HEENT Present- Ringing in the Ears, Seasonal Allergies and Wears glasses/contact lenses. Not Present- Earache, Hearing Loss, Hoarseness, Nose Bleed, Oral Ulcers, Sinus Pain, Sore  Throat, Visual Disturbances and Yellow Eyes. Breast Not Present- Breast Mass, Breast Pain, Nipple Discharge and Skin Changes. Cardiovascular Present- Leg Cramps and Palpitations. Not Present- Chest Pain, Difficulty Breathing Lying  Down, Rapid Heart Rate, Shortness of Breath and Swelling of Extremities. Gastrointestinal Present- Bloating, Bloody Stool, Change in Bowel Habits, Chronic diarrhea and Indigestion. Not Present- Abdominal Pain, Constipation, Difficulty Swallowing, Excessive gas, Gets full quickly at meals, Hemorrhoids, Nausea, Rectal Pain and Vomiting. Female Genitourinary Present- Frequency, Nocturia and Urgency. Not Present- Painful Urination and Pelvic Pain. Musculoskeletal Present- Back Pain, Joint Pain, Joint Stiffness, Muscle Weakness and Swelling of Extremities. Not Present- Muscle Pain. Neurological Present- Weakness. Not Present- Decreased Memory, Fainting, Headaches, Numbness, Seizures, Tingling, Tremor and Trouble walking. Psychiatric Present- Frequent crying. Not Present- Anxiety, Bipolar, Change in Sleep Pattern, Depression and Fearful. Endocrine Present- Hot flashes. Not Present- Cold Intolerance, Excessive Hunger, Hair Changes, Heat Intolerance and New Diabetes.  Vitals (Sonya Mcpherson [name]; 03/04/2016 1:30 PM) 03/04/2016 1:30 PM Weight: 216 lb Height: 62.5in Body Surface Area: 1.99 m Body Mass Index: 38.88 kg/m  Temp.: 23F(Temporal)  Pulse: 76 (Regular)  BP: 124/74 (Sitting, Left Arm, Standard)      Physical Exam [name]; 03/04/2016 2:12 PM)  General Mental Status-Alert. General Appearance-Not in acute distress, Not Sickly. Orientation-Oriented X3. Hydration-Well hydrated. Voice-Normal.  Integumentary Global Assessment Upon inspection and palpation of skin surfaces of the - Axillae: non-tender, no inflammation or ulceration, no drainage. and Distribution of scalp and body hair is normal. General Characteristics Temperature -  normal warmth is noted.  Head and Neck Head-normocephalic, atraumatic with no lesions or palpable masses. Face Global Assessment - atraumatic, no absence of expression. Neck Global Assessment - no abnormal movements, no bruit auscultated on the right, no bruit auscultated on the left, no decreased range of motion, non-tender. Trachea-midline. Thyroid Gland Characteristics - non-tender.  Eye Eyeball - Left-Extraocular movements intact, No Nystagmus. Eyeball - Right-Extraocular movements intact, No Nystagmus. Cornea - Left-No Hazy. Cornea - Right-No Hazy. Sclera/Conjunctiva - Left-No scleral icterus, No Discharge. Sclera/Conjunctiva - Right-No scleral icterus, No Discharge. Pupil - Left-Direct reaction to light normal. Pupil - Right-Direct reaction to light normal.  ENMT Ears Pinna - Left - no drainage observed, no generalized tenderness observed. Right - no drainage observed, no generalized tenderness observed. Nose and Sinuses External Inspection of the Nose - no destructive lesion observed. Inspection of the nares - Left - quiet respiration. Right - quiet respiration. Mouth and Throat Lips - Upper Lip - no fissures observed, no pallor noted. Lower Lip - no fissures observed, no pallor noted. Nasopharynx - no discharge present. Oral Cavity/Oropharynx - Tongue - no dryness observed. Oral Mucosa - no cyanosis observed. Hypopharynx - no evidence of airway distress observed.  Chest and Lung Exam Inspection Movements - Normal and Symmetrical. Accessory muscles - No use of accessory muscles in breathing. Palpation Palpation of the chest reveals - Non-tender. Auscultation Breath sounds - Normal and Clear.  Cardiovascular Auscultation Rhythm - Regular. Murmurs & Other Heart Sounds - Auscultation of the heart reveals - No Murmurs and No Systolic Clicks.  Abdomen Inspection Inspection of the abdomen reveals - No Visible peristalsis and No Abnormal pulsations.  Umbilicus - No Bleeding, No Urine drainage. Palpation/Percussion Palpation and Percussion of the abdomen reveal - Soft, Non Tender, No Rebound tenderness, No Rigidity (guarding) and No Cutaneous hyperesthesia. Note: Morbidly obese but soft. Right subcostal and Pfannenstiel incisions. Right lower quadrant transverse incisions with some discomfort. No obvious hernia.  Female Genitourinary Sexual Maturity Tanner 5 - Adult hair pattern. Note: No vaginal bleeding nor discharge  Rectal Note: Exam done with assistance of female Medical Assistant in the room. Perianal skin clean with good  hygiene. No pruritis ani. No pilonidal disease. No fissure. No abscess/fistula. Normal sphincter tone. Tolerates digital rectal exam. Moderate midline raphae anterior midline tissue. Not classic external hemorrhoids oh.    Posterior sessile midrectal friable mass. Somewhat sensitive. Around 6 cm from the anal verge. Primarily posterior midline to left lateral aspect. About a third of the circumference.  Peripheral Vascular Upper Extremity Inspection - Left - No Cyanotic nailbeds, Not Ischemic. Right - No Cyanotic nailbeds, Not Ischemic.  Neurologic Neurologic evaluation reveals -normal attention span and ability to concentrate, able to name objects and repeat phrases. Appropriate fund of knowledge , normal sensation and normal coordination. Mental Status Affect - not angry, not paranoid. Cranial Nerves-Normal Bilaterally. Gait-Normal.  Neuropsychiatric Mental status exam performed with findings of-able to articulate well with normal speech/language, rate, volume and coherence, thought content normal with ability to perform basic computations and apply abstract reasoning and no evidence of hallucinations, delusions, obsessions or homicidal/suicidal ideation.  Musculoskeletal Global Assessment Spine, Ribs and Pelvis - no instability, subluxation or laxity. Right Upper Extremity - no instability,  subluxation or laxity.  Lymphatic Head & Neck  General Head & Neck Lymphatics: Bilateral - Description - No Localized lymphadenopathy. Axillary  General Axillary Region: Bilateral - Description - No Localized lymphadenopathy. Femoral & Inguinal  Generalized Femoral & Inguinal Lymphatics: Left - Description - No Localized lymphadenopathy. Right - Description - No Localized lymphadenopathy.    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 I'm early posterior/left posterolateral. 7-4:00 positioning. Soft. Biopsy consistent with adenomatous change. Just that one polyp. No other areas. Called and confirmed with her gastroenterologist, Dr. Benson Norway.  I think this would benefit from more definitive resection. Reasonable start with TEM partial proctectomy full thickness excision with primary closure. There is cancer within it, then she will require repeat low anterior resection with probable coloanal anastomosis. Diverting loop ileostomy. She is definitely worried about having an ostomy since she struggled with her first diverting loop ileostomy. I am hopeful that TEM will be all she needs  She like to wait until after her family vacation in September. Reasonable to wait since its most likely been there for quite some time. Do not see any strong indication redoing an endorectal ultrasound in the absence of no high-grade dysplasia nor cancer. Would not change plan of initially full thickness TEM partial proctectomy resection.  PREOP COLON - ENCOUNTER FOR PREOPERATIVE EXAMINATION FOR GENERAL SURGICAL PROCEDURE (Z01.818)  Current Plans You are being scheduled for surgery - partial proctectomy of polyp at colorectal anastomosis using TEM system transanally.  Our schedulers will call you. You should hear from our office's scheduling department within 5 working  days about the location, date, and time of surgery. We try to make accommodations for patient's preferences in scheduling surgery, but sometimes the OR schedule or the surgeon's schedule prevents [place] from making those accommodations.  If you have not heard from our office [phone]) in 5 working days, call the office and ask for your surgeon's nurse.  If you have other questions about your diagnosis, plan, or surgery, call the office and ask for your surgeon's nurse.  Written instructions provided Pt Education - [facility] TEM Education (Adalei Novell): discussed with patient and provided information. Pt Education - [facility] Colon Bowel Prep 2015 Miralax/Antibiotics Started Neomycin Sulfate 500MG , 2 (two) Tablet SEE NOTE, #6, 03/04/2016, No Refill. Local Order: TAKE TWO TABLETS AT 2 PM,  3 PM, AND 10 PM THE DAY PRIOR TO SURGERY Started Flagyl 500MG , 2 (two) Tablet SEE NOTE, #6, 03/04/2016, No Refill. Local Order: Take at 2pm, 3pm, and 10pm the day prior to your colon operation Pt Education - [place] (Darion Juhasz) Pt Education - [facility] Pain Control (Zahrah Sutherlin)  Linda Hector, M.D., F.A.C.S. Gastrointestinal and Minimally Invasive Surgery [facility] [address] [place], [place] [address] [phone] Main / Paging

## 2016-03-09 ENCOUNTER — Encounter: Payer: Self-pay | Admitting: Gastroenterology

## 2016-05-11 ENCOUNTER — Encounter (HOSPITAL_COMMUNITY): Payer: Self-pay

## 2016-05-11 ENCOUNTER — Encounter (HOSPITAL_COMMUNITY)
Admission: RE | Admit: 2016-05-11 | Discharge: 2016-05-11 | Disposition: A | Discharge status: Home / Self Care | Payer: Managed Care, Other (non HMO) | Source: Ambulatory Visit | Attending: Surgery | Admitting: Surgery

## 2016-05-11 HISTORY — DX: Other chronic pain: G89.29

## 2016-05-11 HISTORY — DX: Family history of ischemic heart disease and other diseases of the circulatory system: Z82.49

## 2016-05-11 HISTORY — DX: Personal history of urinary calculi: Z87.442

## 2016-05-11 HISTORY — DX: Dorsalgia, unspecified: M54.9

## 2016-05-11 LAB — CBC
HEMATOCRIT: 40.1 % (ref 36.0–46.0)
HEMOGLOBIN: 13.6 g/dL (ref 12.0–15.0)
MCH: 29.8 pg (ref 26.0–34.0)
MCHC: 33.9 g/dL (ref 30.0–36.0)
MCV: 87.7 fL (ref 78.0–100.0)
Platelets: 264 10*3/uL (ref 150–400)
RBC: 4.57 MIL/uL (ref 3.87–5.11)
RDW: 13.4 % (ref 11.5–15.5)
WBC: 6.9 10*3/uL (ref 4.0–10.5)

## 2016-05-11 LAB — BASIC METABOLIC PANEL
Anion gap: 6 (ref 5–15)
BUN: 25 mg/dL — AB (ref 6–20)
CHLORIDE: 113 mmol/L — AB (ref 101–111)
CO2: 24 mmol/L (ref 22–32)
Calcium: 9.6 mg/dL (ref 8.9–10.3)
Creatinine, Ser: 0.64 mg/dL (ref 0.44–1.00)
GFR calc Af Amer: >60 mL/min (ref >60)
GFR calc non Af Amer: >60 mL/min (ref >60)
Glucose, Bld: 100 mg/dL — ABNORMAL HIGH (ref 65–99)
POTASSIUM: 3.9 mmol/L (ref 3.5–5.1)
SODIUM: 143 mmol/L (ref 135–145)

## 2016-05-11 NOTE — Progress Notes (Signed)
BMP results in epic per PAT visit 05/11/2016 sent to Dr Johney Maine

## 2016-05-11 NOTE — Patient Instructions (Addendum)
[name]  05/11/2016   Your procedure is scheduled on: Thursday May 14, 2016  Report to [facility] Main  Entrance take [place]  elevators to 3rd floor to  [address] at 5:30 AM.  Call this number if you have problems the morning of surgery [phone]   Remember: ONLY 1 PERSON MAY GO WITH YOU TO SHORT STAY TO GET  READY MORNING OF [place].  Do not eat food or drink liquids :After Midnight.     Take these medicines the morning of surgery with A SIP OF WATER: NONE                                You may not have any metal on your body including hair pins and              piercings  Do not wear jewelry, make-up, lotions, powders or perfumes, deodorant             Do not wear nail polish.  Do not shave  48 hours prior to surgery.          Do not bring valuables to the hospital. [place].  Contacts, dentures or bridgework may not be worn into surgery.  Leave suitcase in the car. After surgery it may be brought to your room.    Special Instructions: FOLLOW SURGEON'S INSTRUCTION IN REGARDS TO BOWEL PREPARATION PRIOR TO SURGICAL PROCEDURE DATE    _____________________________________________________________________  _____________________________________________________________________             [facility] - Preparing for Surgery Before surgery, you can play an important role.  Because skin is not sterile, your skin needs to be as free of germs as possible.  You can reduce the number of germs on your skin by washing with CHG (chlorahexidine gluconate) soap before surgery.  CHG is an antiseptic cleaner which kills germs and bonds with the skin to continue killing germs even after washing. Please DO NOT use if you have an allergy to CHG or antibacterial soaps.  If your skin becomes reddened/irritated stop using the CHG and inform your nurse when you arrive at Short Stay. Do not shave  (including legs and underarms) for at least 48 hours prior to the first CHG shower.  You may shave your face/neck. Please follow these instructions carefully:  1.  Shower with CHG Soap the night before surgery and the  morning of Surgery.  2.  If you choose to wash your hair, wash your hair first as usual with your  normal  shampoo.  3.  After you shampoo, rinse your hair and body thoroughly to remove the  shampoo.                           4.  Use CHG as you would any other liquid soap.  You can apply chg directly  to the skin and wash                       Gently with a scrungie or clean washcloth.  5.  Apply the CHG Soap to your body ONLY FROM THE NECK DOWN.   Do not use  on face/ open                           Wound or open sores. Avoid contact with eyes, ears mouth and genitals (private parts).                       Wash face,  Genitals (private parts) with your normal soap.             6.  Wash thoroughly, paying special attention to the area where your surgery  will be performed.  7.  Thoroughly rinse your body with warm water from the neck down.  8.  DO NOT shower/wash with your normal soap after using and rinsing off  the CHG Soap.                9.  Pat yourself dry with a clean towel.            10.  Wear clean pajamas.            11.  Place clean sheets on your bed the night of your first shower and do not  sleep with pets. Day of Surgery : Do not apply any lotions/deodorants the morning of surgery.  Please wear clean clothes to the hospital/surgery center.  FAILURE TO FOLLOW THESE INSTRUCTIONS MAY RESULT IN THE CANCELLATION OF YOUR SURGERY PATIENT SIGNATURE_________________________________  NURSE SIGNATURE__________________________________  ________________________________________________________________________

## 2016-05-12 LAB — HEMOGLOBIN A1C
Hgb A1c MFr Bld: 5.4 % (ref 4.8–5.6)
Mean Plasma Glucose: 108 mg/dL

## 2016-05-13 MED ORDER — SODIUM CHLORIDE 0.9 % IV SOLN
INTRAVENOUS | Status: DC
  Filled 2016-05-13: qty 6

## 2016-05-14 ENCOUNTER — Inpatient Hospital Stay (HOSPITAL_COMMUNITY)
Admission: AD | Admit: 2016-05-14 | Discharge: 2016-05-15 | DRG: 376 | Disposition: A | Discharge status: Home / Self Care | Payer: Managed Care, Other (non HMO) | Source: Ambulatory Visit | Attending: Surgery | Admitting: Surgery

## 2016-05-14 ENCOUNTER — Ambulatory Visit (HOSPITAL_COMMUNITY): Payer: Managed Care, Other (non HMO) | Admitting: Certified Registered"

## 2016-05-14 ENCOUNTER — Encounter (HOSPITAL_COMMUNITY): Payer: Self-pay | Admitting: Certified Registered"

## 2016-05-14 ENCOUNTER — Encounter (HOSPITAL_COMMUNITY)
Admission: AD | Disposition: A | Discharge status: Home / Self Care | Payer: Self-pay | Source: Ambulatory Visit | Attending: Surgery

## 2016-05-14 DIAGNOSIS — Z01812 Encounter for preprocedural laboratory examination: Secondary | ICD-10-CM | POA: Diagnosis not present

## 2016-05-14 DIAGNOSIS — C20 Malignant neoplasm of rectum: Secondary | ICD-10-CM | POA: Diagnosis present

## 2016-05-14 DIAGNOSIS — Z832 Family history of diseases of the blood and blood-forming organs and certain disorders involving the immune mechanism: Secondary | ICD-10-CM | POA: Diagnosis not present

## 2016-05-14 DIAGNOSIS — Z87442 Personal history of urinary calculi: Secondary | ICD-10-CM

## 2016-05-14 DIAGNOSIS — Z9049 Acquired absence of other specified parts of digestive tract: Secondary | ICD-10-CM

## 2016-05-14 DIAGNOSIS — G8929 Other chronic pain: Secondary | ICD-10-CM | POA: Diagnosis present

## 2016-05-14 DIAGNOSIS — M1990 Unspecified osteoarthritis, unspecified site: Secondary | ICD-10-CM | POA: Diagnosis present

## 2016-05-14 DIAGNOSIS — Z820 Family history of epilepsy and other diseases of the nervous system: Secondary | ICD-10-CM | POA: Diagnosis not present

## 2016-05-14 DIAGNOSIS — Z86010 Personal history of colonic polyps: Secondary | ICD-10-CM | POA: Diagnosis not present

## 2016-05-14 DIAGNOSIS — M549 Dorsalgia, unspecified: Secondary | ICD-10-CM | POA: Diagnosis present

## 2016-05-14 DIAGNOSIS — D128 Benign neoplasm of rectum: Secondary | ICD-10-CM | POA: Diagnosis present

## 2016-05-14 DIAGNOSIS — Z6838 Body mass index (BMI) 38.0-38.9, adult: Secondary | ICD-10-CM

## 2016-05-14 DIAGNOSIS — Z8041 Family history of malignant neoplasm of ovary: Secondary | ICD-10-CM

## 2016-05-14 DIAGNOSIS — K621 Rectal polyp: Secondary | ICD-10-CM | POA: Diagnosis present

## 2016-05-14 DIAGNOSIS — K219 Gastro-esophageal reflux disease without esophagitis: Secondary | ICD-10-CM | POA: Diagnosis present

## 2016-05-14 HISTORY — PX: PARTIAL PROCTECTOMY BY TEM: SHX6011

## 2016-05-14 HISTORY — DX: Benign neoplasm of rectum: D12.8

## 2016-05-14 HISTORY — DX: Malignant neoplasm of rectum: C20

## 2016-05-14 SURGERY — PARTIAL PROCTECTOMY BY TEM
Anesthesia: General

## 2016-05-14 MED ORDER — DEXTROSE 5 % IV SOLN
2.0000 g | INTRAVENOUS | Status: AC
  Administered 2016-05-14: 2 g via INTRAVENOUS
  Filled 2016-05-14: qty 2

## 2016-05-14 MED ORDER — PHENOL 1.4 % MT LIQD: 2.0000 | OROMUCOSAL | Status: DC | PRN

## 2016-05-14 MED ORDER — ONDANSETRON HCL 4 MG/2ML IJ SOLN
INTRAMUSCULAR | Status: AC
  Filled 2016-05-14: qty 2

## 2016-05-14 MED ORDER — SUGAMMADEX SODIUM 200 MG/2ML IV SOLN
INTRAVENOUS | Status: DC | PRN
  Administered 2016-05-14: 100 mg via INTRAVENOUS

## 2016-05-14 MED ORDER — INFLUENZA VAC SPLIT QUAD 0.5 ML IM SUSY
0.5000 mL | PREFILLED_SYRINGE | INTRAMUSCULAR | Status: AC
  Administered 2016-05-15: 0.5 mL via INTRAMUSCULAR
  Filled 2016-05-14 (×2): qty 0.5

## 2016-05-14 MED ORDER — DEXAMETHASONE SODIUM PHOSPHATE 10 MG/ML IJ SOLN
INTRAMUSCULAR | Status: DC | PRN
  Administered 2016-05-14: 8 mg via INTRAVENOUS

## 2016-05-14 MED ORDER — LACTATED RINGERS IR SOLN
Status: DC | PRN
Start: 2016-05-14 — End: 2016-05-14
  Administered 2016-05-14: 1000 mL

## 2016-05-14 MED ORDER — 0.9 % SODIUM CHLORIDE (POUR BTL) OPTIME
TOPICAL | Status: DC | PRN
  Administered 2016-05-14: 1000 mL

## 2016-05-14 MED ORDER — MAGIC MOUTHWASH
15.0000 mL | Freq: Four times a day (QID) | ORAL | Status: DC | PRN
  Filled 2016-05-14: qty 15

## 2016-05-14 MED ORDER — SUGAMMADEX SODIUM 200 MG/2ML IV SOLN
INTRAVENOUS | Status: AC
  Filled 2016-05-14: qty 2

## 2016-05-14 MED ORDER — LACTATED RINGERS IV SOLN
INTRAVENOUS | Status: DC
  Administered 2016-05-14: 22:00:00 via INTRAVENOUS

## 2016-05-14 MED ORDER — DIBUCAINE 1 % RE OINT
TOPICAL_OINTMENT | RECTAL | Status: AC
  Filled 2016-05-14: qty 28

## 2016-05-14 MED ORDER — CEFOTETAN DISODIUM-DEXTROSE 2-2.08 GM-% IV SOLR
INTRAVENOUS | Status: AC
  Filled 2016-05-14: qty 50

## 2016-05-14 MED ORDER — ROCURONIUM BROMIDE 10 MG/ML (PF) SYRINGE
PREFILLED_SYRINGE | INTRAVENOUS | Status: DC | PRN
Start: 2016-05-14 — End: 2016-05-14
  Administered 2016-05-14: 45 mg via INTRAVENOUS
  Administered 2016-05-14: 5 mg via INTRAVENOUS

## 2016-05-14 MED ORDER — ACETAMINOPHEN 500 MG PO TABS
1000.0000 mg | ORAL_TABLET | Freq: Three times a day (TID) | ORAL | Status: DC
  Administered 2016-05-14 – 2016-05-15 (×3): 1000 mg via ORAL
  Filled 2016-05-14 (×3): qty 2

## 2016-05-14 MED ORDER — ADULT MULTIVITAMIN W/MINERALS CH
1.0000 | ORAL_TABLET | Freq: Every day | ORAL | Status: DC
  Administered 2016-05-14 – 2016-05-15 (×2): 1 via ORAL
  Filled 2016-05-14 (×3): qty 1

## 2016-05-14 MED ORDER — METOPROLOL TARTRATE 12.5 MG HALF TABLET: 12.5000 mg | ORAL_TABLET | Freq: Two times a day (BID) | ORAL | Status: DC | PRN

## 2016-05-14 MED ORDER — ZINC OXIDE 40 % EX OINT
TOPICAL_OINTMENT | Freq: Two times a day (BID) | CUTANEOUS | Status: DC | PRN
  Filled 2016-05-14: qty 114

## 2016-05-14 MED ORDER — SACCHAROMYCES BOULARDII 250 MG PO CAPS
250.0000 mg | ORAL_CAPSULE | Freq: Two times a day (BID) | ORAL | Status: DC
Start: 2016-05-14 — End: 2016-05-15
  Administered 2016-05-14 – 2016-05-15 (×2): 250 mg via ORAL
  Filled 2016-05-14 (×2): qty 1

## 2016-05-14 MED ORDER — MIDAZOLAM HCL 5 MG/5ML IJ SOLN
INTRAMUSCULAR | Status: DC | PRN
  Administered 2016-05-14: 2 mg via INTRAVENOUS

## 2016-05-14 MED ORDER — HYDROMORPHONE HCL 1 MG/ML IJ SOLN
INTRAMUSCULAR | Status: AC
  Filled 2016-05-14: qty 1

## 2016-05-14 MED ORDER — HYDROMORPHONE HCL 1 MG/ML IJ SOLN
0.5000 mg | INTRAMUSCULAR | Status: DC | PRN
  Administered 2016-05-14 – 2016-05-15 (×5): 1 mg via INTRAVENOUS
  Filled 2016-05-14 (×5): qty 1

## 2016-05-14 MED ORDER — BUPIVACAINE LIPOSOME 1.3 % IJ SUSP
INTRAMUSCULAR | Status: DC | PRN
  Administered 2016-05-14: 20 mL

## 2016-05-14 MED ORDER — OXYCODONE HCL 5 MG PO TABS
5.0000 mg | ORAL_TABLET | ORAL | Status: DC | PRN
  Administered 2016-05-15: 10 mg via ORAL
  Filled 2016-05-14: qty 2

## 2016-05-14 MED ORDER — MENTHOL 3 MG MT LOZG: 1.0000 | LOZENGE | OROMUCOSAL | Status: DC | PRN

## 2016-05-14 MED ORDER — METOPROLOL TARTRATE 5 MG/5ML IV SOLN: 5.0000 mg | Freq: Four times a day (QID) | INTRAVENOUS | Status: DC | PRN

## 2016-05-14 MED ORDER — ONDANSETRON HCL 4 MG/2ML IJ SOLN
INTRAMUSCULAR | Status: DC | PRN
  Administered 2016-05-14: 4 mg via INTRAVENOUS

## 2016-05-14 MED ORDER — PROPOFOL 10 MG/ML IV BOLUS
INTRAVENOUS | Status: DC | PRN
  Administered 2016-05-14: 130 mg via INTRAVENOUS

## 2016-05-14 MED ORDER — FENTANYL CITRATE (PF) 100 MCG/2ML IJ SOLN
INTRAMUSCULAR | Status: DC | PRN
  Administered 2016-05-14: 50 ug via INTRAVENOUS
  Administered 2016-05-14: 100 ug via INTRAVENOUS
  Administered 2016-05-14 (×3): 50 ug via INTRAVENOUS

## 2016-05-14 MED ORDER — LIP MEDEX EX OINT
1.0000 "application " | TOPICAL_OINTMENT | Freq: Two times a day (BID) | CUTANEOUS | Status: DC
  Administered 2016-05-14 – 2016-05-15 (×3): 1 via TOPICAL
  Filled 2016-05-14: qty 7

## 2016-05-14 MED ORDER — ROCURONIUM BROMIDE 10 MG/ML (PF) SYRINGE
PREFILLED_SYRINGE | INTRAVENOUS | Status: AC
  Filled 2016-05-14: qty 10

## 2016-05-14 MED ORDER — ENOXAPARIN SODIUM 40 MG/0.4ML ~~LOC~~ SOLN
40.0000 mg | SUBCUTANEOUS | Status: DC
  Administered 2016-05-15: 40 mg via SUBCUTANEOUS
  Filled 2016-05-14: qty 0.4

## 2016-05-14 MED ORDER — DIPHENHYDRAMINE HCL 50 MG/ML IJ SOLN: 12.5000 mg | Freq: Four times a day (QID) | INTRAMUSCULAR | Status: DC | PRN

## 2016-05-14 MED ORDER — BUPIVACAINE-EPINEPHRINE 0.25% -1:200000 IJ SOLN
INTRAMUSCULAR | Status: DC | PRN
  Administered 2016-05-14: 20 mL

## 2016-05-14 MED ORDER — LIDOCAINE 2% (20 MG/ML) 5 ML SYRINGE
INTRAMUSCULAR | Status: DC | PRN
  Administered 2016-05-14: 40 mg via INTRAVENOUS

## 2016-05-14 MED ORDER — CELECOXIB 200 MG PO CAPS
400.0000 mg | ORAL_CAPSULE | ORAL | Status: AC
  Administered 2016-05-14: 400 mg via ORAL
  Filled 2016-05-14: qty 2

## 2016-05-14 MED ORDER — POLYETHYLENE GLYCOL 3350 17 G PO PACK
17.0000 g | PACK | Freq: Every day | ORAL | Status: DC
  Administered 2016-05-15: 17 g via ORAL
  Filled 2016-05-14: qty 1

## 2016-05-14 MED ORDER — FENTANYL CITRATE (PF) 100 MCG/2ML IJ SOLN
INTRAMUSCULAR | Status: AC
  Filled 2016-05-14: qty 2

## 2016-05-14 MED ORDER — HYDROCORTISONE 1 % EX CREA
1.0000 "application " | TOPICAL_CREAM | Freq: Four times a day (QID) | CUTANEOUS | Status: DC | PRN
  Filled 2016-05-14: qty 28

## 2016-05-14 MED ORDER — LIDOCAINE 2% (20 MG/ML) 5 ML SYRINGE
INTRAMUSCULAR | Status: AC
  Filled 2016-05-14: qty 5

## 2016-05-14 MED ORDER — BUPIVACAINE LIPOSOME 1.3 % IJ SUSP
20.0000 mL | Freq: Once | INTRAMUSCULAR | Status: DC
  Filled 2016-05-14: qty 20

## 2016-05-14 MED ORDER — PROPOFOL 10 MG/ML IV BOLUS
INTRAVENOUS | Status: AC
  Filled 2016-05-14: qty 20

## 2016-05-14 MED ORDER — ACETAMINOPHEN 500 MG PO TABS
1000.0000 mg | ORAL_TABLET | ORAL | Status: AC
  Administered 2016-05-14: 1000 mg via ORAL
  Filled 2016-05-14: qty 2

## 2016-05-14 MED ORDER — DIBUCAINE 1 % RE OINT
TOPICAL_OINTMENT | RECTAL | Status: DC | PRN
  Administered 2016-05-14: 1 via RECTAL

## 2016-05-14 MED ORDER — DIPHENHYDRAMINE HCL 12.5 MG/5ML PO ELIX: 12.5000 mg | ORAL_SOLUTION | Freq: Four times a day (QID) | ORAL | Status: DC | PRN

## 2016-05-14 MED ORDER — GABAPENTIN 300 MG PO CAPS
300.0000 mg | ORAL_CAPSULE | ORAL | Status: AC
  Administered 2016-05-14: 300 mg via ORAL
  Filled 2016-05-14: qty 1

## 2016-05-14 MED ORDER — ENOXAPARIN SODIUM 40 MG/0.4ML ~~LOC~~ SOLN
40.0000 mg | Freq: Once | SUBCUTANEOUS | Status: AC
  Administered 2016-05-14: 40 mg via SUBCUTANEOUS
  Filled 2016-05-14: qty 0.4

## 2016-05-14 MED ORDER — MIDAZOLAM HCL 2 MG/2ML IJ SOLN
INTRAMUSCULAR | Status: AC
  Filled 2016-05-14: qty 2

## 2016-05-14 MED ORDER — PROMETHAZINE HCL 25 MG/ML IJ SOLN: 6.2500 mg | INTRAMUSCULAR | Status: DC | PRN

## 2016-05-14 MED ORDER — CEFOTETAN DISODIUM 2 G IJ SOLR
2.0000 g | Freq: Two times a day (BID) | INTRAMUSCULAR | Status: AC
  Administered 2016-05-14: 2 g via INTRAVENOUS
  Filled 2016-05-14: qty 2

## 2016-05-14 MED ORDER — LACTATED RINGERS IV SOLN
INTRAVENOUS | Status: DC | PRN
  Administered 2016-05-14: 07:00:00 via INTRAVENOUS

## 2016-05-14 MED ORDER — FENTANYL CITRATE (PF) 100 MCG/2ML IJ SOLN
INTRAMUSCULAR | Status: AC
  Filled 2016-05-14: qty 4

## 2016-05-14 MED ORDER — PROCHLORPERAZINE EDISYLATE 5 MG/ML IJ SOLN
10.0000 mg | Freq: Four times a day (QID) | INTRAMUSCULAR | Status: DC | PRN
  Administered 2016-05-14: 10 mg via INTRAVENOUS
  Filled 2016-05-14: qty 2

## 2016-05-14 MED ORDER — ALUM & MAG HYDROXIDE-SIMETH 200-200-20 MG/5ML PO SUSP: 30.0000 mL | Freq: Four times a day (QID) | ORAL | Status: DC | PRN

## 2016-05-14 MED ORDER — OXYCODONE HCL 5 MG PO TABS: 5.0000 mg | ORAL_TABLET | ORAL | 0 refills | Status: DC | PRN

## 2016-05-14 MED ORDER — LACTATED RINGERS IV BOLUS (SEPSIS): 1000.0000 mL | Freq: Three times a day (TID) | INTRAVENOUS | Status: DC | PRN

## 2016-05-14 MED ORDER — HYDROMORPHONE HCL 1 MG/ML IJ SOLN
0.2500 mg | INTRAMUSCULAR | Status: DC | PRN
  Administered 2016-05-14: 0.5 mg via INTRAVENOUS
  Administered 2016-05-14 (×2): 0.25 mg via INTRAVENOUS

## 2016-05-14 MED ORDER — BUPIVACAINE-EPINEPHRINE 0.5% -1:200000 IJ SOLN
INTRAMUSCULAR | Status: AC
Start: 2016-05-14 — End: 2016-05-14
  Filled 2016-05-14: qty 1

## 2016-05-14 MED ORDER — FENTANYL CITRATE (PF) 100 MCG/2ML IJ SOLN: 25.0000 ug | INTRAMUSCULAR | Status: DC | PRN

## 2016-05-14 SURGICAL SUPPLY — 50 items
BLADE SURG 15 STRL LF DISP TIS (BLADE) IMPLANT
BLADE SURG 15 STRL SS (BLADE)
BRIEF STRETCH FOR OB PAD LRG (UNDERPADS AND DIAPERS) IMPLANT
CABLE HIGH FREQUENCY MONO STRZ (ELECTRODE) ×2 IMPLANT
COVER SURGICAL LIGHT HANDLE (MISCELLANEOUS) ×1 IMPLANT
DRAPE LAPAROTOMY T 102X78X121 (DRAPES) ×1 IMPLANT
DRAPE WARM FLUID 44X44 (DRAPE) ×2 IMPLANT
DRSG PAD ABDOMINAL 8X10 ST (GAUZE/BANDAGES/DRESSINGS) IMPLANT
ELECT PENCIL ROCKER SW 15FT (MISCELLANEOUS) IMPLANT
ELECT REM PT RETURN 9FT ADLT (ELECTROSURGICAL) ×2
ELECTRODE REM PT RTRN 9FT ADLT (ELECTROSURGICAL) ×1 IMPLANT
GAUZE SPONGE 4X4 12PLY STRL (GAUZE/BANDAGES/DRESSINGS) IMPLANT
GAUZE SPONGE 4X4 16PLY XRAY LF (GAUZE/BANDAGES/DRESSINGS) ×2 IMPLANT
GLOVE ECLIPSE 8.0 STRL XLNG CF (GLOVE) ×4 IMPLANT
GLOVE INDICATOR 8.0 STRL GRN (GLOVE) ×4 IMPLANT
GOWN STRL REUS W/TWL XL LVL3 (GOWN DISPOSABLE) ×6 IMPLANT
KIT BASIN OR (CUSTOM PROCEDURE TRAY) ×2 IMPLANT
LEGGING LITHOTOMY PAIR STRL (DRAPES) ×2 IMPLANT
LUBRICANT JELLY K Y 4OZ (MISCELLANEOUS) ×2 IMPLANT
NEEDLE HYPO 22GX1.5 SAFETY (NEEDLE) ×2 IMPLANT
PACK BASIC VI WITH GOWN DISP (CUSTOM PROCEDURE TRAY) ×2 IMPLANT
PAD ABD 8X10 STRL (GAUZE/BANDAGES/DRESSINGS) ×1 IMPLANT
PAD POSITIONING PINK XL (MISCELLANEOUS) ×1 IMPLANT
RETRACTOR LONE STAR DISPOSABLE (INSTRUMENTS) IMPLANT
RETRACTOR STAY HOOK 5MM (MISCELLANEOUS) IMPLANT
SCISSORS LAP 5X35 DISP (ENDOMECHANICALS) ×2 IMPLANT
SET IRRIG TUBING LAPAROSCOPIC (IRRIGATION / IRRIGATOR) ×1 IMPLANT
SHEARS HARMONIC ACE PLUS 36CM (ENDOMECHANICALS) ×2 IMPLANT
STOPCOCK 4 WAY LG BORE MALE ST (IV SETS) ×1 IMPLANT
SUT CHROMIC 3 0 SH 27 (SUTURE) IMPLANT
SUT PDS AB 2-0 CT2 27 (SUTURE) IMPLANT
SUT PDS AB 3-0 SH 27 (SUTURE) IMPLANT
SUT SILK 2 0 (SUTURE)
SUT SILK 2 0 SH CR/8 (SUTURE) IMPLANT
SUT SILK 2-0 18XBRD TIE 12 (SUTURE) IMPLANT
SUT SILK 3 0 SH 30 (SUTURE) IMPLANT
SUT SILK 3 0 SH CR/8 (SUTURE) IMPLANT
SUT V-LOC BARB 180 2/0GR6 GS22 (SUTURE) ×8
SUT VIC AB 2-0 UR6 27 (SUTURE) IMPLANT
SUT VIC AB 3-0 SH 27 (SUTURE)
SUT VIC AB 3-0 SH 27XBRD (SUTURE) IMPLANT
SUTURE V-LC BRB 180 2/0GR6GS22 (SUTURE) IMPLANT
SYR 20CC LL (SYRINGE) ×2 IMPLANT
SYR BULB IRRIGATION 50ML (SYRINGE) IMPLANT
TOWEL OR 17X26 10 PK STRL BLUE (TOWEL DISPOSABLE) ×2 IMPLANT
TOWEL OR NON WOVEN STRL DISP B (DISPOSABLE) ×2 IMPLANT
TRAY FOLEY W/METER SILVER 16FR (SET/KITS/TRAYS/PACK) ×1 IMPLANT
TUBING CONNECTING 10 (TUBING) IMPLANT
TUBING INSUF HEATED (TUBING) ×2 IMPLANT
YANKAUER SUCT BULB TIP 10FT TU (MISCELLANEOUS) IMPLANT

## 2016-05-14 NOTE — Anesthesia Preprocedure Evaluation (Signed)
Anesthesia Evaluation  Patient identified by MRN, date of birth, ID band Patient awake    Reviewed: Allergy & Precautions, NPO status , Patient's Chart, lab work & pertinent test results  Airway Mallampati: II  TM Distance: >3 FB Neck ROM: Full    Dental no notable dental hx.    Pulmonary neg pulmonary ROS,    Pulmonary exam normal breath sounds clear to auscultation       Cardiovascular negative cardio ROS Normal cardiovascular exam Rhythm:Regular Rate:Normal     Neuro/Psych negative neurological ROS  negative psych ROS   GI/Hepatic Neg liver ROS, GERD  ,  Endo/Other  negative endocrine ROS  Renal/GU Renal disease  negative genitourinary   Musculoskeletal negative musculoskeletal ROS (+)   Abdominal   Peds negative pediatric ROS (+)  Hematology negative hematology ROS (+)   Anesthesia Other Findings   Reproductive/Obstetrics negative OB ROS                             Anesthesia Physical Anesthesia Plan  ASA: II  Anesthesia Plan: General   Post-op Pain Management:    Induction: Intravenous  Airway Management Planned: Oral ETT  Additional Equipment:   Intra-op Plan:   Post-operative Plan: Extubation in OR  Informed Consent: I have reviewed the patients History and Physical, chart, labs and discussed the procedure including the risks, benefits and alternatives for the proposed anesthesia with the patient or authorized representative who has indicated his/her understanding and acceptance.   Dental advisory given  Plan Discussed with: CRNA  Anesthesia Plan Comments:         Anesthesia Quick Evaluation

## 2016-05-14 NOTE — Interval H&P Note (Signed)
History and Physical Interval Note:  05/14/2016 7:17 AM  [name]  has presented today for surgery, with the diagnosis of RECURRENT RECTAL POLYP  The various methods of treatment have been discussed with the patient and family. After consideration of risks, benefits and other options for treatment, the patient has consented to  Procedure(s): PARTIAL PROCTECTOMY BY TEM OF RECTAL MASS (N/A) as a surgical intervention .  The patient's history has been reviewed, patient examined, no change in status, stable for surgery.  I have reviewed the patient's chart and labs.  Questions were answered to the patient's satisfaction.     Chasen Mendell C.

## 2016-05-14 NOTE — Transfer of Care (Signed)
Immediate Anesthesia Transfer of Care Note  Patient: [name]  Procedure(s) Performed: Procedure(s): PARTIAL PROCTECTOMY BY TEM OF RECTAL MASS (N/A)  Patient Location: PACU  Anesthesia Type:General  Level of Consciousness:  sedated, patient cooperative and responds to stimulation  Airway & Oxygen Therapy:Patient Spontanous Breathing and Patient connected to face mask oxgen  Post-op Assessment:  Report given to PACU RN and Post -op Vital signs reviewed and stable  Post vital signs:  Reviewed and stable  Last Vitals:  Vitals:   05/14/16 0535  BP: 139/79  Pulse: 78  Resp: 18  Temp: [id number] C    Complications: No apparent anesthesia complications

## 2016-05-14 NOTE — H&P (Signed)
[name] 03/04/2016 1:29 PM Location: [facility] Patient #: [id number] DOB: 1955/12/05 Married / Language: English / Race: White Female  Patient Care Team: Linda Cote, NP as PCP - General (Obstetrics and Gynecology) Linda Boston, Mcpherson as Consulting Physician (General Surgery) Linda Ada, Mcpherson as Consulting Physician (Gastroenterology) [name] as Consulting Physician (Gynecology)    History of Present Illness  The patient is a 60 year old female who presents with a colonic polyp. Note for "Colonic polyp": Patient sent for surgical consultation at the request of her gastroenterologist, [name] with [name]. Concern for recurrent rectal polyp.  Pleasant active woman. History of polyps. Underwent low anterior resection for bulky colon polyp with high-grade dysplasia in 2007. Acquired vaginal repair & loop ileostomy fecal diversion. Healed. Mild stricture resolved. Ileostomy taken down 2008. There is a report of al laparoscopic repair of a old ileostomy site hernia. She does not recall any of that.  She had follow-up colonoscopy 2009 that was normal. Also 2012. Had 73-year-old follow-up 2017 that showed polyp at the colorectal anastomosis. Primarily posterior/posterior lateral. Biopsies showed adenomatous change. 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 Original surgeon, Dr. Georgette Dover, requested referral to me given my TEM experience with rectal surgery. Patient moves her bowels a couple times a day. Often loose with some urgency. Occasional blood. Hour without difficulty. She does not smoke. Strong family history of ovarian cancer. Has ovarian ultrasounds that show stable ovaries.  No personal nor family history of inflammatory bowel disease, irritable bowel syndrome, allergy such as Celiac Sprue, dietary/dairy problems, colitis, ulcers nor gastritis. No  recent sick contacts/gastroenteritis. No travel outside the country. No changes in diet. No dysphagia to solids or liquids. No significant heartburn or reflux. No hematochezia, hematemesis, coffee ground emesis. No evidence of prior gastric/peptic ulceration.   Other Problems [name]; 03/04/2016 1:29 PM) Arthritis Back Pain Gastroesophageal Reflux Disease  Past Surgical History [name]; 03/04/2016 1:29 PM) Appendectomy Cesarean Section - 1 Colon Polyp Removal - Open Colon Removal - Partial Gallbladder Surgery - Open  Diagnostic Studies History [name]; 03/04/2016 1:29 PM) Colonoscopy within last year Mammogram 1-3 years ago Pap Smear 1-5 years ago  Allergies [name]; 03/04/2016 1:30 PM) Macrobid *URINARY ANTI-INFECTIVES*  Medication History [name], Mcpherson; 03/04/2016 1:30 PM) No Current Medications Medications Reconciled  Social History [name]; 03/04/2016 1:29 PM) Caffeine use Coffee. No alcohol use No drug use Tobacco use Never smoker.  Family History [name]; 03/04/2016 1:29 PM) Bleeding disorder Father. Ischemic Bowel Disease Father. Ovarian Cancer Family Members In General, Sister.  Pregnancy / Birth History [name], [place]; 03/04/2016 1:29 PM) Age at menarche 54 years. Age of menopause 54-50 Contraceptive History Oral contraceptives. Gravida 3 Maternal age 53-30 Para 3 Regular periods    Review of Systems ([place]; 03/04/2016 1:29 PM) General Present- Fatigue and Night Sweats. Not Present- Appetite Loss, Chills, Fever, Weight Gain and Weight Loss. Skin Not Present- Change in Wart/Mole, Dryness, Hives, Jaundice, New Lesions, Non-Healing Wounds, Rash and Ulcer. HEENT Present- Ringing in the Ears, Seasonal Allergies and Wears glasses/contact lenses. Not Present- Earache, Hearing Loss, Hoarseness, Nose Bleed, Oral Ulcers, Sinus Pain, Sore Throat, Visual Disturbances and  Yellow Eyes. Breast Not Present- Breast Mcpherson, Breast Pain, Nipple Discharge and Skin Changes. Cardiovascular Present- Leg Cramps and Palpitations. Not Present- Chest Pain, Difficulty Breathing Lying Down, Rapid Heart Rate, Shortness of  Breath and Swelling of Extremities. Gastrointestinal Present- Bloating, Bloody Stool, Change in Bowel Habits, Chronic diarrhea and Indigestion. Not Present- Abdominal Pain, Constipation, Difficulty Swallowing, Excessive gas, Gets full quickly at meals, Hemorrhoids, Nausea, Rectal Pain and Vomiting. Female Genitourinary Present- Frequency, Nocturia and Urgency. Not Present- Painful Urination and Pelvic Pain. Musculoskeletal Present- Back Pain, Joint Pain, Joint Stiffness, Muscle Weakness and Swelling of Extremities. Not Present- Muscle Pain. Neurological Present- Weakness. Not Present- Decreased Memory, Fainting, Headaches, Numbness, Seizures, Tingling, Tremor and Trouble walking. Psychiatric Present- Frequent crying. Not Present- Anxiety, Bipolar, Change in Sleep Pattern, Depression and Fearful. Endocrine Present- Hot flashes. Not Present- Cold Intolerance, Excessive Hunger, Hair Changes, Heat Intolerance and New Diabetes.  Vitals (Sonya Mcpherson Mcpherson; 03/04/2016 1:30 PM) 03/04/2016 1:30 PM Weight: 216 lb Height: 62.5in Body Surface Area: 1.99 m Body Mcpherson Index: 38.88 kg/m  Temp.: 26F(Temporal)  Pulse: 76 (Regular)  BP: 124/74 (Sitting, Left Arm, Standard)   BP 139/79   Pulse 78   Temp 97.6 F (36.4 C) (Oral)   Resp 18   Ht 5' 2.5" (1.588 m)   Wt 93.2 kg (205 lb 6 oz)   SpO2 100%   BMI 36.96 kg/m      Physical Exam Linda Hector Mcpherson; 03/04/2016 2:12 PM) General Mental Status-Alert. General Appearance-Not in acute distress, Not Sickly. Orientation-Oriented X3. Hydration-Well hydrated. Voice-Normal.  Integumentary Global Assessment Upon inspection and palpation of skin surfaces of the - Axillae: non-tender, no inflammation or  ulceration, no drainage. and Distribution of scalp and body hair is normal. General Characteristics Temperature - normal warmth is noted.  Head and Neck Head-normocephalic, atraumatic with no lesions or palpable masses. Face Global Assessment - atraumatic, no absence of expression. Neck Global Assessment - no abnormal movements, no bruit auscultated on the right, no bruit auscultated on the left, no decreased range of motion, non-tender. Trachea-midline. Thyroid Gland Characteristics - non-tender.  Eye Eyeball - Left-Extraocular movements intact, No Nystagmus. Eyeball - Right-Extraocular movements intact, No Nystagmus. Cornea - Left-No Hazy. Cornea - Right-No Hazy. Sclera/Conjunctiva - Left-No scleral icterus, No Discharge. Sclera/Conjunctiva - Right-No scleral icterus, No Discharge. Pupil - Left-Direct reaction to light normal. Pupil - Right-Direct reaction to light normal.  ENMT Ears Pinna - Left - no drainage observed, no generalized tenderness observed. Right - no drainage observed, no generalized tenderness observed. Nose and Sinuses External Inspection of the Nose - no destructive lesion observed. Inspection of the nares - Left - quiet respiration. Right - quiet respiration. Mouth and Throat Lips - Upper Lip - no fissures observed, no pallor noted. Lower Lip - no fissures observed, no pallor noted. Nasopharynx - no discharge present. Oral Cavity/Oropharynx - Tongue - no dryness observed. Oral Mucosa - no cyanosis observed. Hypopharynx - no evidence of airway distress observed.  Chest and Lung Exam Inspection Movements - Normal and Symmetrical. Accessory muscles - No use of accessory muscles in breathing. Palpation Palpation of the chest reveals - Non-tender. Auscultation Breath sounds - Normal and Clear.  Cardiovascular Auscultation Rhythm - Regular. Murmurs & Other Heart Sounds - Auscultation of the heart reveals - No Murmurs and No Systolic  Clicks.  Abdomen Inspection Inspection of the abdomen reveals - No Visible peristalsis and No Abnormal pulsations. Umbilicus - No Bleeding, No Urine drainage. Palpation/Percussion Palpation and Percussion of the abdomen reveal - Soft, Non Tender, No Rebound tenderness, No Rigidity (guarding) and No Cutaneous hyperesthesia. Note: Morbidly obese but soft. Right subcostal and Pfannenstiel incisions. Right lower quadrant transverse incisions with some discomfort. No  obvious hernia.   Female Genitourinary Sexual Maturity Tanner 5 - Adult hair pattern. Note: No vaginal bleeding nor discharge   Rectal Note: Exam done with assistance of female Medical Assistant in the room. Perianal skin clean with good hygiene. No pruritis ani. No pilonidal disease. No fissure. No abscess/fistula. Normal sphincter tone. Tolerates digital rectal exam. Moderate midline raphae anterior midline tissue. Not classic external hemorrhoids oh.    Posterior sessile midrectal friable Mcpherson. Somewhat sensitive. Around 6 cm from the anal verge. Primarily posterior midline to left lateral aspect. About a third of the circumference.   Peripheral Vascular Upper Extremity Inspection - Left - No Cyanotic nailbeds, Not Ischemic. Right - No Cyanotic nailbeds, Not Ischemic.  Neurologic Neurologic evaluation reveals -normal attention span and ability to concentrate, able to name objects and repeat phrases. Appropriate fund of knowledge , normal sensation and normal coordination. Mental Status Affect - not angry, not paranoid. Cranial Nerves-Normal Bilaterally. Gait-Normal.  Neuropsychiatric Mental status exam performed with findings of-able to articulate well with normal speech/language, rate, volume and coherence, thought content normal with ability to perform basic computations and apply abstract reasoning and no evidence of hallucinations, delusions, obsessions or homicidal/suicidal  ideation.  Musculoskeletal Global Assessment Spine, Ribs and Pelvis - no instability, subluxation or laxity. Right Upper Extremity - no instability, subluxation or laxity.  Lymphatic Head & Neck  General Head & Neck Lymphatics: Bilateral - Description - No Localized lymphadenopathy. Axillary  General Axillary Region: Bilateral - Description - No Localized lymphadenopathy. Femoral & Inguinal  Generalized Femoral & Inguinal Lymphatics: Left - Description - No Localized lymphadenopathy. Right - Description - No Localized lymphadenopathy.    Assessment & Plan  ADENOMATOUS RECTAL POLYP (D12.8) Impression: Adenomatous recurrence at the colorectal anastomosis from prior low anterior resection. I'm early posterior/left posterolateral. 7-4:00 positioning. Soft. Biopsy consistent with adenomatous change. Just that one polyp. No other areas. Called and confirmed with her gastroenterologist, Dr. Benson Norway.  I think this would benefit from more definitive resection. Reasonable start with TEM partial proctectomy full thickness excision with primary closure. There is cancer within it, then she will require repeat low anterior resection with probable coloanal anastomosis. Diverting loop ileostomy. She is definitely worried about having an ostomy since she struggled with her first diverting loop ileostomy. I am hopeful that TEM will be all she needs  She like to wait until after her family vacation in September. Reasonable to wait since its most likely been there for quite some time. Do not see any strong indication redoing an endorectal ultrasound in the absence of no high-grade dysplasia nor cancer. Would not change plan of initially full thickness TEM partial proctectomy resection.  PREOP COLON - ENCOUNTER FOR PREOPERATIVE EXAMINATION FOR GENERAL SURGICAL PROCEDURE (Z01.818) Current Plans You are being scheduled for surgery - partial proctectomy of polyp at colorectal anastomosis using TEM system  transanally.  Our schedulers will call you. You should hear from our office's scheduling department within 5 working days about the location, date, and time of surgery. We try to make accommodations for patient's preferences in scheduling surgery, but sometimes the OR schedule or the surgeon's schedule prevents [place] from making those accommodations.  If you have not heard from our office [phone]) in 5 working days, call the office and ask for your surgeon's nurse.  If you have other questions about your diagnosis, plan, or surgery, call the office and ask for your surgeon's nurse.  Written instructions provided Pt Education - [facility] TEM Education (Kyro Joswick): discussed with patient  and provided information. Pt Education - [facility] Colon Bowel Prep 2015 Miralax/Antibiotics Started Neomycin Sulfate 500MG , 2 (two) Tablet SEE NOTE, #6, 03/04/2016, No Refill. Local Order: TAKE TWO TABLETS AT 2 PM, 3 PM, AND 10 PM THE DAY PRIOR TO SURGERY Started Flagyl 500MG , 2 (two) Tablet SEE NOTE, #6, 03/04/2016, No Refill. Local Order: Take at 2pm, 3pm, and 10pm the day prior to your colon operation Pt Education - [place] (Tommy Goostree) Pt Education - [facility] Pain Control (Samuella Rasool)   [name], M.D., F.A.C.S. Gastrointestinal and Minimally Invasive Surgery [facility] [address], [place], [place] [address] [phone] Main / Paging

## 2016-05-14 NOTE — Discharge Instructions (Signed)
ANORECTAL SURGERY:  °POST OPERATIVE INSTRUCTIONS ° °###################################################################### ° °EAT °Gradually transition to a high fiber diet with a fiber supplement over the next few weeks after discharge.  Start with a pureed / full liquid diet (see below) ° °WALK °Walk an hour a day.  Control your pain to do that.   ° °CONTROL PAIN °Control pain so that you can walk, sleep, tolerate sneezing/coughing, go up/down stairs. ° °HAVE A BOWEL MOVEMENT DAILY °Keep your bowels regular to avoid problems.  OK to try a laxative to override constipation.  OK to use an antidairrheal to slow down diarrhea.  Call if not better after 2 tries ° °CALL IF YOU HAVE PROBLEMS/CONCERNS °Call if you are still struggling despite following these instructions. °Call if you have concerns not answered by these instructions ° °###################################################################### ° ° ° °1. Take your usually prescribed home medications unless otherwise directed. °2. DIET: Follow a light bland diet the first 24 hours after arrival home, such as soup, liquids, crackers, etc.  Be sure to include lots of fluids daily.  Avoid fast food or heavy meals as your are more likely to get nauseated.  Eat a low fat the next few days after surgery.   °3. PAIN CONTROL: °a. Pain is best controlled by a usual combination of three different methods TOGETHER: °i. Ice/Heat °ii. Over the counter pain medication °iii. Prescription pain medication °b. Most patients will experience some swelling and discomfort in the anus/rectal area. and incisions.  Ice packs or heat (30-60 minutes up to 6 times a day) will help. Use ice for the first few days to help decrease swelling and bruising, then switch to heat such as warm towels, sitz baths, warm baths, etc to help relax tight/sore spots and speed recovery.  Some people prefer to use ice alone, heat alone, alternating between ice & heat.  Experiment to what works for you.   Swelling and bruising can take several weeks to resolve.   °c. It is helpful to take an over-the-counter pain medication regularly for the first few weeks.  Choose one of the following that works best for you: °i. Naproxen (Aleve, etc)  Two 220mg tabs twice a day °ii. Ibuprofen (Advil, etc) Three 200mg tabs four times a day (every meal & bedtime) °iii. Acetaminophen (Tylenol, etc) 500-650mg four times a day (every meal & bedtime) °d. A  prescription for pain medication (such as oxycodone, hydrocodone, etc) should be given to you upon discharge.  Take your pain medication as prescribed.  °i. If you are having problems/concerns with the prescription medicine (does not control pain, nausea, vomiting, rash, itching, etc), please call us [phone] to see if we need to switch you to a different pain medicine that will work better for you and/or control your side effect better. °ii. If you need a refill on your pain medication, please contact your pharmacy.  They will contact our office to request authorization. Prescriptions will not be filled after 5 pm or on week-ends. ° °Use a Sitz Bath 4-8 times a day for relief ° ° °Sitz Bath °A sitz bath is a warm water bath taken in the sitting position that covers only the hips and buttocks. It may be used for either healing or hygiene purposes. Sitz baths are also used to relieve pain, itching, or muscle spasms. The water may contain medicine. Moist heat will help you heal and relax.  °HOME CARE INSTRUCTIONS  °Take 3 to 4 sitz baths a day. °1. Fill the bathtub   half full with warm water. °2. Sit in the water and open the drain a little. °3. Turn on the warm water to keep the tub half full. Keep the water running constantly. °4. Soak in the water for 15 to 20 minutes. °5. After the sitz bath, pat the affected area dry first. ° ° °4. KEEP YOUR BOWELS REGULAR °a. The goal is one bowel movement a day °b. Avoid getting constipated.  Between the surgery and the pain medications, it  is common to experience some constipation.  Increasing fluid intake and taking a fiber supplement (such as Metamucil, Citrucel, FiberCon, MiraLax, etc) 1-2 times a day regularly will usually help prevent this problem from occurring.  A mild laxative (prune juice, Milk of Magnesia, MiraLax, etc) should be taken according to package directions if there are no bowel movements after 48 hours. °c. Watch out for diarrhea.  If you have many loose bowel movements, simplify your diet to bland foods & liquids for a few days.  Stop any stool softeners and decrease your fiber supplement.  Switching to mild anti-diarrheal medications (Kayopectate, Pepto Bismol) can help.  If this worsens or does not improve, please call us. ° °5. Wound Care ° °a. Remove your bandages the day after surgery.  Unless discharge instructions indicate otherwise, leave your bandage dry and in place overnight.  Remove the bandage during your first bowel movement.   °b. Wear an absorbent pad or soft cotton gauze in your underwear as needed to catch any drainage and help keep the area  °c. Keep the area clean and dry.  Bathe / shower every day.  Keep the area clean by showering / bathing over the incision / wound.   It is okay to soak an open wound to help wash it.  Wet wipes or showers / gentle washing after bowel movements is often less traumatic than regular toilet paper. °d. You will often notice bleeding with bowel movements.  This should slow down by the end of the first week of surgery °e. Expect some drainage.  This should slow down, too, by the end of the first week of surgery.  Wear an absorbent pad or soft cotton gauze in your underwear until the drainage stops. ° °6. ACTIVITIES as tolerated:   °a. You may resume regular (light) daily activities beginning the next day--such as daily self-care, walking, climbing stairs--gradually increasing activities as tolerated.  If you can walk 30 minutes without difficulty, it is safe to try more intense  activity such as jogging, treadmill, bicycling, low-impact aerobics, swimming, etc. °b. Save the most intensive and strenuous activity for last such as sit-ups, heavy lifting, contact sports, etc  Refrain from any heavy lifting or straining until you are off narcotics for pain control.   °c. DO NOT PUSH THROUGH PAIN.  Let pain be your guide: If it hurts to do something, don't do it.  Pain is your body warning you to avoid that activity for another week until the pain goes down. °d. You may drive when you are no longer taking prescription pain medication, you can comfortably sit for long periods of time, and you can safely maneuver your car and apply brakes. °e. You may have sexual intercourse when it is comfortable.  °7. FOLLOW UP in our office °a. Please call [facility] at [phone] to set up an appointment to see your surgeon in the office for a follow-up appointment approximately 2 weeks after your surgery. °b. Make sure that you call for   this appointment the day you arrive home to insure a convenient appointment time. 10. IF YOU HAVE DISABILITY OR FAMILY LEAVE FORMS, BRING THEM TO THE OFFICE FOR PROCESSING.  DO NOT GIVE THEM TO YOUR DOCTOR.        WHEN TO CALL us [phone]: 1. Poor pain control 2. Reactions / problems with new medications (rash/itching, nausea, etc)  3. Fever over 101.5 F (38.5 C) 4. Inability to urinate 5. Nausea and/or vomiting 6. Worsening swelling or bruising 7. Continued bleeding from incision. 8. Increased pain, redness, or drainage from the incision  The clinic staff is available to answer your questions during regular business hours (8:30am-5pm).  Please dont hesitate to call and ask to speak to one of our nurses for clinical concerns.   A surgeon from [facility] is always on call at the hospitals   If you have a medical emergency, go to the nearest emergency room or call 911.    [facility], [place], [place],  [place], [place]  [address] ? MAIN: [phone] ? TOLL FREE: [phone] ? FAX [phone] [email or web address]  Colon Polyps Polyps are lumps of extra tissue growing inside the body. Polyps can grow in the large intestine (colon). Most colon polyps are noncancerous (benign). However, some colon polyps can become cancerous over time. Polyps that are larger than a pea may be harmful. To be safe, caregivers remove and test all polyps. CAUSES  Polyps form when mutations in the genes cause your cells to grow and divide even though no more tissue is needed. RISK FACTORS There are a number of risk factors that can increase your chances of getting colon polyps. They include:  Being older than 50 years.  Family history of colon polyps or colon cancer.  Long-term colon diseases, such as colitis or Crohn disease.  Being overweight.  Smoking.  Being inactive.  Drinking too much alcohol. SYMPTOMS  Most small polyps do not cause symptoms. If symptoms are present, they may include:  Blood in the stool. The stool may look dark red or black.  Constipation or diarrhea that lasts longer than 1 week. DIAGNOSIS People often do not know they have polyps until their caregiver finds them during a regular checkup. Your caregiver can use 4 tests to check for polyps:  Digital rectal exam. The caregiver wears gloves and feels inside the rectum. This test would find polyps only in the rectum.  Barium enema. The caregiver puts a liquid called barium into your rectum before taking X-rays of your colon. Barium makes your colon look white. Polyps are dark, so they are easy to see in the X-ray pictures.  Sigmoidoscopy. A thin, flexible tube (sigmoidoscope) is placed into your rectum. The sigmoidoscope has a light and tiny camera in it. The caregiver uses the sigmoidoscope to look at the last third of your colon.  Colonoscopy. This test is like sigmoidoscopy, but the caregiver looks at the entire  colon. This is the most common method for finding and removing polyps. TREATMENT  Any polyps will be removed during a sigmoidoscopy or colonoscopy. The polyps are then tested for cancer. PREVENTION  To help lower your risk of getting more colon polyps:  Eat plenty of fruits and vegetables. Avoid eating fatty foods.  Do not smoke.  Avoid drinking alcohol.  Exercise every day.  Lose weight if recommended by your caregiver.  Eat plenty of calcium and folate. Foods that are rich in calcium include milk, cheese, and  broccoli. Foods that are rich in folate include chickpeas, kidney beans, and spinach. HOME CARE INSTRUCTIONS Keep all follow-up appointments as directed by your caregiver. You may need periodic exams to check for polyps. SEEK MEDICAL CARE IF: You notice bleeding during a bowel movement.   This information is not intended to replace advice given to you by your health care provider. Make sure you discuss any questions you have with your health care provider.   Document Released: 05/13/2004 Document Revised: 09/07/2014 Document Reviewed: 10/27/2011 Elsevier Interactive Patient Education [employer].

## 2016-05-14 NOTE — Anesthesia Procedure Notes (Signed)
Procedure Name: Intubation Date/Time: 05/14/2016 7:37 AM Performed by: Lajuana Carry E Pre-anesthesia Checklist: Patient identified, Emergency Drugs available, Suction available and Patient being monitored Patient Re-evaluated:Patient Re-evaluated prior to inductionOxygen Delivery Method: Circle system utilized Preoxygenation: Pre-oxygenation with 100% oxygen Intubation Type: IV induction Ventilation: Mask ventilation without difficulty Laryngoscope Size: Miller and 2 Grade View: Grade I Tube type: Oral Number of attempts: 1 Airway Equipment and Method: Stylet and Oral airway Placement Confirmation: ETT inserted through vocal cords under direct vision,  positive ETCO2 and breath sounds checked- equal and bilateral Secured at: 22 cm Tube secured with: Tape Dental Injury: Teeth and Oropharynx as per pre-operative assessment

## 2016-05-14 NOTE — Op Note (Signed)
05/14/2016  10:15 AM  PATIENT:  [name]  60 y.o. 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  INDICATION: Patient status post low anterior resection for large rectal polyp in 2007.  Required vaginal repair & loop ileostomy diversion.  Had loop ileostomy takedown.  Had hernia repair.  Found to have recurrent polyp at colorectal anastomosis along posterior midline/left lateral.  She refused another attempt at a low anterior resection.  Because there is no definite proof of cancer, I offered exam under anesthesia with TEM resection.  The anatomy & physiology of the digestive tract was discussed.  The pathophysiology of the rectal pathology was discussed.  Natural history risks without surgery was discussed.   I feel the risks of no intervention will lead to serious problems that outweigh the operative risks; therefore, I recommended surgery.    Laparoscopic & open abdominal techniques were discussed.  I recommended we start with a partial proctectomy by transanal  endoscopic microsurgery (TEM) for excisional biopsy to remove the pathology and hopefully cure and/or control the pathology.  This technique can offer less operative risk and faster post-operative recovery.  Possible need for immediate or later abdominal surgery for further treatment was discussed.   Risks such as bleeding, abscess, reoperation, ostomy, heart attack, death, and other risks were discussed.   I noted a good likelihood this will help address the problem.  Goals of post-operative recovery were discussed as well.  We will work to minimize complications.  An educational handout was given as well.  Questions were answered.  The patient expresses understanding & wishes to proceed with surgery.  OR FINDINGS: Bulky 6 x 5 cm polyp in the posterior midline rectum including 40% of the colorectal anastomosis.  Most of the polyp on the rectal side.    Pin placement on pathology specimen: Proximal margin: Blue Distal margin: Red Right lateral margin: white Left lateral margin: yellow  The closure rests 2-3cm from the anal verge in the posterior 80%  DESCRIPTION: Informed consent was confirmed.  Patient received general anesthesia without difficulty.  Foley catheter sterilely placed.  Sequential compression devices active during the entire case.  The patient was placed in the prone position, taking extra care to secure and protect the patient appropriately.  The perineum and perianal regions were prepped and draped in a sterile fashion.  Surgical timeout confirmed our plan.  I did a gentle digital rectal examination with gradual anal dilation to allow placement of the 4 cm TEO Stortz scope.  This was secured to the bed using the TEO clamping system.  We induced carbon dioxide insufflation intraluminally.  I  oriented the [facility] scope and the patient such that the specimen rested towards the floor at the 6:00 position.  I could easily identify the Mcpherson.  Quite bulky and friable offing posterior 40%  circumference of the rectum at its bulkiest location but really more around 60% by the end..  I went ahead and used tip point cautery to mark 1 cm margins circumferentially.  I then did a full thickness transection at the distal margin.  I came around laterally.  Switched over to harmonic dissection.  Lifted the specimen off the pelvic canal for a good deep margin.  I gradually came more proximally.  Transected at the proximal margin proximal to the colorectal anastomosis..  I ensured hemostasis.  I inspected the main specimen and pinned it on thick cork board.  It seemed like the left lateral margin was rather close.  Therefore went back and did a 7 mm margin resection from the distal left side to the proximal left side Pins as noted above.  I walked the specimen down to pathology and showed the [name] pathology team for proper orientation.    I went back in and scrubbed in.  Hemostasis excellent.  I reapproximated the wound with a 2-0 V-lock horizontal mattress stitch to bring the middle part of the rectum down to help close the middle of the wound.  I then closed the wound using a  2-0 V-lock serrated stitch in a running fashion.  I did this with underlying gripped and running the locks.  Started in the anterolateral aspects then came more posteriorly.  It was a challenge in that the colon would not well mobilized down towards the distal rectum.  Do not want a breech in the peritoneal cavity, I gradually brought tissues together.  She has are quite thinned out in the posterior midline colon and pulled through on sutures.  However with re-suturing things with better more proximal colon tissue seemed to help bring together things better.  There was some narrowing at the closure, but it was about 2 cm wide and did allow my finger to pass.    Hemostasis is good.  She had no pneumoperitoneum, arguing against a peritoneal breech.  This brought things together well.  I did meticulous inspection with fine tip  instruments to confirm good watertight closure   Hemostasis excellent.  The lumen was quite patent.  Carbon dioxide evacuated & instruments removed.  She is extubated.  She is in the recovery room.  Her abdomen is flat and soft without any peritonitis or discomfort.  I discussed operative findings, updated the patient's status, discussed probable steps to recovery, and gave postoperative recommendations to the patient's family.  Recommendations were made.  Questions were answered.  They expressed understanding & appreciation.   [name], M.D., F.A.C.S. Gastrointestinal and Minimally Invasive Surgery [facility] [address] [place], [place] [address] [phone] Main / Paging

## 2016-05-14 NOTE — Anesthesia Postprocedure Evaluation (Signed)
Anesthesia Post Note  Patient: [name]  Procedure(s) Performed: Procedure(s) (LRB): PARTIAL PROCTECTOMY BY TEM OF RECTAL MASS (N/A)  Patient location during evaluation: PACU Anesthesia Type: General Level of consciousness: awake and alert Pain management: pain level controlled Vital Signs Assessment: post-procedure vital signs reviewed and stable Respiratory status: spontaneous breathing, nonlabored ventilation, respiratory function stable and patient connected to nasal cannula oxygen Cardiovascular status: blood pressure returned to baseline and stable Postop Assessment: no signs of nausea or vomiting Anesthetic complications: no    Last Vitals:  Vitals:   05/14/16 1148 05/14/16 1207  BP: 131/85 130/69  Pulse:  73  Resp:  13  Temp: 36.6 C 36.6 C    Last Pain:  Vitals:   05/14/16 1207  TempSrc:   PainSc: 7                  Kenton Fortin J

## 2016-05-15 LAB — BASIC METABOLIC PANEL
Anion gap: 8 (ref 5–15)
BUN: 18 mg/dL (ref 6–20)
CHLORIDE: 108 mmol/L (ref 101–111)
CO2: 20 mmol/L — AB (ref 22–32)
CREATININE: 0.83 mg/dL (ref 0.44–1.00)
Calcium: 9.1 mg/dL (ref 8.9–10.3)
GFR calc Af Amer: >60 mL/min (ref >60)
GFR calc non Af Amer: >60 mL/min (ref >60)
GLUCOSE: 113 mg/dL — AB (ref 65–99)
Potassium: 3.8 mmol/L (ref 3.5–5.1)
SODIUM: 136 mmol/L (ref 135–145)

## 2016-05-15 LAB — CBC
HEMATOCRIT: 34.9 % — AB (ref 36.0–46.0)
Hemoglobin: 12.4 g/dL (ref 12.0–15.0)
MCH: 30 pg (ref 26.0–34.0)
MCHC: 35.5 g/dL (ref 30.0–36.0)
MCV: 84.3 fL (ref 78.0–100.0)
PLATELETS: 290 10*3/uL (ref 150–400)
RBC: 4.14 MIL/uL (ref 3.87–5.11)
RDW: 13 % (ref 11.5–15.5)
WBC: 17.7 10*3/uL — AB (ref 4.0–10.5)

## 2016-05-15 LAB — MAGNESIUM: Magnesium: 2 mg/dL (ref 1.7–2.4)

## 2016-05-15 MED ORDER — PANTOPRAZOLE SODIUM 40 MG PO TBEC
40.0000 mg | DELAYED_RELEASE_TABLET | Freq: Every day | ORAL | Status: DC
  Administered 2016-05-15: 40 mg via ORAL
  Filled 2016-05-15: qty 1

## 2016-05-15 MED ORDER — SODIUM CHLORIDE 0.9% FLUSH: 3.0000 mL | Freq: Two times a day (BID) | INTRAVENOUS | Status: DC

## 2016-05-15 MED ORDER — ALUM & MAG HYDROXIDE-SIMETH 200-200-20 MG/5ML PO SUSP: 30.0000 mL | Freq: Four times a day (QID) | ORAL | Status: DC | PRN

## 2016-05-15 MED ORDER — SODIUM CHLORIDE 0.9 % IV SOLN: 250.0000 mL | INTRAVENOUS | Status: DC | PRN

## 2016-05-15 MED ORDER — LACTATED RINGERS IV BOLUS (SEPSIS): 1000.0000 mL | Freq: Three times a day (TID) | INTRAVENOUS | Status: DC | PRN

## 2016-05-15 MED ORDER — SODIUM CHLORIDE 0.9% FLUSH: 3.0000 mL | INTRAVENOUS | Status: DC | PRN
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
## 2016-05-15 NOTE — Progress Notes (Addendum)
[facility]  [address] [place], [place] [address] Phone: [phone] FAX: [phone]   [name] [id number] Feb 09, 1956  CARE TEAM:  PCP: [name], NP  Outpatient Care Team: Patient Care Team: [name], NP as PCP - General (Obstetrics and Gynecology) [name], MD as Consulting Physician (General Surgery) [name], MD as Consulting Physician (Gastroenterology) [name], MD as Consulting Physician (Gynecology)  Inpatient Treatment Team: Treatment Team: Attending Provider: [name], MD; Technician: Leda Quail, NT; Registered Nurse: Arnold Long, RN  Problem List:   Principal Problem:   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 05/14/2016   1 Day Post-Op  05/14/2016  Procedure(s): PARTIAL PROCTECTOMY BY TEM OF RECTAL MASS   Assessment  Stable  Plan:  -adv diet -d/c IVFs -VTE prophylaxis- SCDs, etc -mobilize as tolerated to help recovery D/C patient from hospital when patient meets criteria (anticipate later today vs AM):  Tolerating oral intake well Ambulating well Adequate pain control without IV medications Urinating  Having flatus Disposition planning in place   [name], M.D., F.A.C.S. Gastrointestinal and Minimally Invasive Surgery [facility] [address] [place], [place] [address] [phone] Main / Paging   05/15/2016  Subjective:  Required extensive surgical resection - expect >23hr observation at first, but pot feeling much better Nauseated - better +BM Been in room Foley out Husband in room RNs just outside  Objective:  Vital signs:  Vitals:   05/14/16 2139 05/15/16 0147 05/15/16 0527 05/15/16 0830  BP: 106/63 106/60 109/65   Pulse: 61 60 72   Resp: 16 16 16    Temp: 97.6 F (36.4 C) 97.5 F (36.4 C) 97.6 F (36.4 C)   TempSrc: Oral Oral Oral   SpO2: 100% 100% 100%   Weight:     92 kg (202 lb 14.4 oz)  Height:        Last BM Date: 05/14/16  Intake/Output   Yesterday:  09/14 0701 - 09/15 0700 In: 2760 [P.O.:960; I.V.:1750; IV Piggyback:50] Out: 3300 [Urine:1382; Blood:50] This shift:  Total I/O In: -  Out: 250 [Urine:250]  Bowel function:  Flatus: YES  BM:  YES  Drain: (No drain)   Physical Exam:  General: Pt awake/alert/oriented x4 in No acute distress.  Smiling, calm Eyes: PERRL, normal EOM.  Sclera clear.  No icterus Neuro: CN II-XII intact w/o focal sensory/motor deficits. Lymph: No head/neck/groin lymphadenopathy Psych:  No delerium/psychosis/paranoia HENT: Normocephalic, Mucus membranes moist.  No thrush Neck: Supple, No tracheal deviation Chest: No chest wall pain w good excursion CV:  Pulses intact.  Regular rhythm MS: Normal AROM mjr joints.  No obvious deformity Abdomen: Soft.  Nondistended.  Nontender.  No evidence of peritonitis.  No incarcerated hernias. Ext:  SCDs BLE.  No mjr edema.  No cyanosis Skin: No petechiae / purpura  Results:   Labs: Results for orders placed or performed during the hospital encounter of 05/14/16 (from the past 48 hour(s))  Basic metabolic panel     Status: Abnormal   Collection Time: 05/15/16  5:09 AM  Result Value Ref Range   Sodium 136 135 - 145 mmol/L   Potassium 3.8 3.5 - 5.1 mmol/L   Chloride 108 101 - 111 mmol/L   CO2 20 (L) 22 - 32 mmol/L   Glucose, Bld 113 (H) 65 - 99 mg/dL   BUN 18 6 - 20 mg/dL   Creatinine, Ser 0.83 0.44 - 1.00 mg/dL  Calcium 9.1 8.9 - 10.3 mg/dL   GFR calc non Af Amer >60 >60 mL/min   GFR calc Af Amer >60 >60 mL/min    Comment: (NOTE) The eGFR has been calculated using the CKD EPI equation. This calculation has not been validated in all clinical situations. eGFR's persistently <60 mL/min signify possible Chronic Kidney Disease.    Anion gap 8 5 - 15  CBC     Status: Abnormal   Collection Time: 05/15/16  5:09 AM  Result Value Ref Range   WBC 17.7 (H) 4.0 -  10.5 K/uL   RBC 4.14 3.87 - 5.11 MIL/uL   Hemoglobin 12.4 12.0 - 15.0 g/dL   HCT 34.9 (L) 36.0 - 46.0 %   MCV 84.3 78.0 - 100.0 fL   MCH 30.0 26.0 - 34.0 pg   MCHC 35.5 30.0 - 36.0 g/dL   RDW 13.0 11.5 - 15.5 %   Platelets 290 150 - 400 K/uL  Magnesium     Status: None   Collection Time: 05/15/16  5:09 AM  Result Value Ref Range   Magnesium 2.0 1.7 - 2.4 mg/dL    Imaging / Studies: No results found.  Medications / Allergies: per chart  Antibiotics: Anti-infectives    Start     Dose/Rate Route Frequency Ordered Stop   05/14/16 1800  cefoTEtan (CEFOTAN) 2 g in dextrose 5 % 50 mL IVPB     2 g 100 mL/hr over 30 Minutes Intravenous Every 12 hours 05/14/16 1211 05/14/16 1931   05/14/16 0600  clindamycin (CLEOCIN) 900 mg, gentamicin (GARAMYCIN) 240 mg in sodium chloride 0.9 % 1,000 mL for intraperitoneal lavage  Status:  Discontinued    Comments:  Have in the  OR room for final irrigation in bowel surgery case to minimize risk of abscess/infection Pharmacy may adjust dosing strength, schedule, rate of infusion, etc as needed to optimize therapy    Intraperitoneal On call to O.R. 05/13/16 1306 05/14/16 1205   05/14/16 0532  cefoTEtan (CEFOTAN) 2 g in dextrose 5 % 50 mL IVPB     2 g 100 mL/hr over 30 Minutes Intravenous On call to O.R. 05/14/16 0532 05/14/16 0756        Note: Portions of this report may have been transcribed using voice recognition software. Every effort was made to ensure accuracy; however, inadvertent computerized transcription errors may be present.   Any transcriptional errors that result from this process are unintentional.     [name], M.D., F.A.C.S. Gastrointestinal and Minimally Invasive Surgery [facility] [address], [place], [place] [address] [phone] Main / Paging   05/15/2016

## 2016-05-24 ENCOUNTER — Telehealth: Payer: Self-pay | Admitting: Surgery
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
## 2016-05-27 ENCOUNTER — Other Ambulatory Visit: Payer: Self-pay | Admitting: Surgery

## 2016-05-27 DIAGNOSIS — C20 Malignant neoplasm of rectum: Secondary | ICD-10-CM

## 2016-05-27 NOTE — Progress Notes (Signed)
  I was able to reach the patient yesterday & discuss pathology results.  Because there is a cancer, standard care is more definitive resection.  Discussed planning for low anterior resection with diverting loop ileostomy.  Need for metastatic workup.  [facility].  Long discussion.  She wishes to be aggressive and proceed with surgery.  She has an appointment to see me in the office in two days for further discussion.

## 2016-05-28 ENCOUNTER — Ambulatory Visit: Payer: Self-pay | Admitting: Surgery

## 2016-05-28 NOTE — H&P (Signed)
Francetta Found Dietrick 05/28/2016 3:52 PM Location: [facility] Patient #: [id number] DOB: December 24, 1955 Married / Language: English / Race: White Female  History of Present Illness [name]; 05/28/2016 6:12 PM) The patient is a 60 year old female who presents with colorectal cancer. Note for "Colorectal cancer": Patient returns status post TEM resection of bulky posterior rectal wall mass at and mostly distal to the colorectal anastomosis.   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 Discuss with her at length earlier this week on the phone. She comes in today with her husband and daughter. They have many questions. Patient is appetite is fair. She is eating most solid foods but trying to use smaller bites. Has a fair amount of fecal urgency. Some chronic pain on her tailbone like she had a bad hemorrhoidectomy. No incontinence. Had worsening pain and difficulty with urination. Harder relaxing urinate. I started her on some antibiotics. She just started a dose or 2. She had been urinating okay last week. No major bleeding. No fevers or chills or sweats. Had a low-grade temperature around 100.4 at one point. Not now. She is tired but not toxic.  They many questions about diagnosis and treatment. Need for further interventions. Need for second opinion.     05/14/2016  PATIENT: Gerline Legacy 60 y.o. 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  INDICATION: Patient status post low anterior resection for large rectal polyp in 2007. Required vaginal repair & loop ileostomy diversion. Had loop ileostomy takedown. Had hernia repair. Found to have recurrent polyp at colorectal anastomosis along posterior midline/left lateral. She refused another attempt at a low anterior resection. Because there is no definite proof of cancer, I offered exam under anesthesia with TEM resection.  OR FINDINGS: Bulky 6 x 5 cm polyp in the posterior midline rectum including 40% of the colorectal anastomosis. Most of the polyp on the rectal side.   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), GRADE 2 THE TUMOR INVADES MUSCULARIS PROPRIA (PT2) ALL MARGINS OF RESECTION ARE NEGATIVE FOR CARCINOMA ONE BENIGN LYMPH NODE (0/1) Microscopic Comment COLON AND RECTUM (INCLUDING TRANS-ANAL RESECTION): Specimen: Rectum Procedure: Resection Tumor site: Rectum Specimen integrity: Intact Macroscopic intactness of mesorectum: Not applicable: X Complete: NA Near complete: NA Incomplete: NA Cannot be determined (specify): NA Macroscopic tumor perforation: In Invasive tumor: Maximum size: 3.5 cm Histologic type(s): adenocarcinoma Histologic grade and  differentiation: G1: well differentiated/low grade G2: moderately differentiated/low grade  G3: poorly differentiated/high grade G4: undifferentiated/high grade Type of polyp in which invasive carcinoma arose: Tubular adenoma Microscopic extension of invasive tumor:Muscularis propria Lymph-Vascular invasion: Negative 1 of 5 Supplemental copy SUPPLEMENTAL for Shuey, Synia G ([id number]) Microscopic Comment(continued) Peri-neural invasion: Negative Tumor deposit(s) (discontinuous extramural extension): Negative Resection margins: Proximal margin: Negative Distal margin: Negative Circumferential (radial) (posterior ascending, posterior descending; lateral and posterior mid-rectum; and entire lower 1/3 rectum):Negative Mesenteric margin (sigmoid and transverse): NA Distance closest margin (if all above margins negative): 0.4 cm Trans-anal resection margins only: Deep margin: Negative Mucosal Margin: Negative Distance closest mucosal margin (if negative): 0.4 cm Treatment effect (neo-adjuvant therapy): negative Additional polyp(s): Negative Non-neoplastic findings: unremarkable Lymph nodes: number examined 1; number positive: 0 Pathologic Staging: pT2, N0, Mx Ancillary studies: MSI ordered Casimer Lanius MD Pathologist, Electronic Signature (Case signed 05/19/2016) Specimen Harrison Zetina and Clinical Information Specimen(s) Obtained: Rectum, resection, polyp Specimen Clinical Information recurrent rectal polyp [rd] Sigismund Cross Received fresh is the product of a transanal resection of rectal polyp, which is oriented by the clinician as follows: blue pins at proximal, red pins at distal, yellow pins at left lateral, and white pins at right lateral. The specimen is 8.8 cm from proximal to distal, 7.4 cm from left lateral to right lateral, and the specimen is excised up to 1 cm deep, with areas of soft fatty tissue on the deep surface. The central specimen has a 6.5 x 6.5 cm area of tan red  nodular and granular mucosa, with a central fungating polypoid area, 3.5 x 3 cm and is up to 1.2 cm. On sectioning, there is underlying ill defined, but no definite invasion is identified at time of Felis Quillin. Found within the lesion are multiple staples consistent with previous anastomosis. The specimen is inked as follows: proximal blue, distal red, left lateral yellow, right lateral green, deep margin black. Representative sections of this tissue are submitted as follows: A-G= full thickness central sections of lesion, to include deep margin only. H,I= radial sections of nearest superior margin. J,K= radial sections of nearest left lateral margin. L,M= radial sections of nearest distal margin. N,O= radial sections of right lateral margin. Also in the same container is a 10.9 cm in length strip of tan pink to hyperemic focally granular mucosa, ranging in width from 0.4 to 0.8 cm, and ranges in thickness from 0.4 to 0.7 cm. The specimen is oriented, clinically identifying as 2 of 5 Supplemental copy SUPPLEMENTAL for Shelden, Armoni G ([id number]) Tyrae Alcoser(continued) additional left lateral margin, with blue pin at most proximal and red pin at most distal. The new left lateral margin is inked orange, and opposite (old corresponding margin) inked yellow, and representative sections of this strip of tissue are sequentially submitted from proximal to distal in blocks P-T. Total 20 blocks. (SW:gt, 05/15/16) Disclaimer Some of these immunohistochemical stains may have been developed and the performance characteristics determined by [facility]. Some may not have been cleared or approved by the U.S. Food and Drug Administration. The FDA has determined that such clearance or approval is not necessary. This test is used for clinical purposes. It should not be regarded as investigational or for research. This laboratory is certified under the [place]  (CLIA-88) as qualified to perform high complexity clinical laboratory testing. Report signed out from the following location(s) Technical Component was performed at [facility]. [address],[place] [address].[id number]., Interpretation was performed at [address] [address], [place], [place] [address]. CLIA #:  [id number], 3 of   Allergies Elbert Ewings, [place]; 05/28/2016 3:52 PM) Macrobid *URINARY ANTI-INFECTIVES*  Medication History Elbert Ewings, [place]; 05/28/2016 3:53 PM) Cipro (500MG Tablet, 1 (one) Tablet Oral two times daily, Taken starting 05/27/2016) Active. Flagyl (500MG Tablet, 1 (one) Tablet Oral two times daily, Taken starting 05/27/2016) Active. (Take at 2pm, 3pm, and 10pm the day prior to your colon operation) OxyCODONE HCl (5MG Tablet, 1-2 Tablet Oral every four hours, as needed for pain, Taken starting 05/27/2016) Active. Medications Reconciled Multiple Vitamins (Oral) Active.    Vitals Elbert Ewings [name]; 05/28/2016 3:53 PM) 05/28/2016 3:53 PM Weight: 197 lb Height: 63in Body Surface Area: 1.92 m Body Mass Index: 34.9 kg/m  Temp.: 97.56F(Temporal)  Pulse: 90 (Regular)  BP: 130/74 (Sitting, Left Arm, Standard)      Physical Exam [name]; 05/28/2016 6:12 PM)  General Mental Status-Alert. General Appearance-Not in acute distress. Voice-Normal.  Integumentary Global Assessment Upon inspection and palpation of skin surfaces of the - Distribution of scalp and body hair is normal. General Characteristics Overall examination of the patient's skin reveals - no rashes and no suspicious lesions.  Head and Neck Head-normocephalic, atraumatic with no lesions or palpable masses. Face Global Assessment - atraumatic, no absence of expression. Neck Global Assessment - no abnormal movements, no decreased range of motion. Trachea-midline. Thyroid Gland Characteristics -  non-tender.  Eye Eyeball - Left-Extraocular movements intact, No Nystagmus. Eyeball - Right-Extraocular movements intact, No Nystagmus. Upper Eyelid - Left-No Cyanotic. Upper Eyelid - Right-No Cyanotic.  Chest and Lung Exam Inspection Accessory muscles - No use of accessory muscles in breathing.  Abdomen Note: Abdomen soft. Nontender, nondistended. No guarding. No umbilical no other hernias  Rectal Note: Held off on any digital rectal exam today.  Peripheral Vascular Upper Extremity Inspection - Left - Not Gangrenous, No Petechiae. Right - Not Gangrenous, No Petechiae.  Neurologic Neurologic evaluation reveals -normal attention span and ability to concentrate, able to name objects and repeat phrases. Appropriate fund of knowledge and normal coordination.  Neuropsychiatric Mental status exam performed with findings of-able to articulate well with normal speech/language, rate, volume and coherence and no evidence of hallucinations, delusions, obsessions or homicidal/suicidal ideation. Orientation-oriented X3.  Musculoskeletal Global Assessment Gait and Station - normal gait and station.  Lymphatic General Lymphatics Description - No Generalized lymphadenopathy.    Assessment & Plan [name]; 05/28/2016 6:15 PM)  RECTAL CANCER (C20) Impression: Rectal cancer within a large polyp at/slightly distal to the colorectal anastomosis from prior resection for debulking rectal adenomatous polyp. Therefore, Status post TEM resection. T2 cancer. Margins negative.  Standard of care is low anterior resection. This would be very low with probable coloanal handsewn anastomosis and protective loop ileostomy.  Challenge with her she's already had a prior low anterior resection for a bulky polyp. Required vaginal repair. Another challenge will be getting adequate lymph nodes for this but hopefully with more complete better mesorectal excision and as well as ensuring  high ligation of the IMA/IMV I will get better sampling/staging.  I spent over 45 minutes talking with the patient and her family about numerous issues.  I recommend she continue pain control. Okay to use oxycodone as needed. Continue ibuprofen. Warm soaks.  Recommend she control diarrhea more aggressively as possible. No upper limit on the Imodium. She is worried about getting constipated. I noted to gradually increased Imodium as tolerated to see if that helps.  Consider flaxseed MiraLAX. Continue Gas-X as needed. Trying decrease flatulence. Hopefully that will  help as well.  PREOP COLON - ENCOUNTER FOR PREOPERATIVE EXAMINATION FOR GENERAL SURGICAL PROCEDURE (Z01.818)  Current Plans You are being scheduled for surgery - Our schedulers will call you.  You should hear from our office's scheduling department within 5 working days about the location, date, and time of surgery. We try to make accommodations for patient's preferences in scheduling surgery, but sometimes the OR schedule or the surgeon's schedule prevents [place] from making those accommodations.  If you have not heard from our office [phone]) in 5 working days, call the office and ask for your surgeon's nurse.  If you have other questions about your diagnosis, plan, or surgery, call the office and ask for your surgeon's nurse.  Written instructions provided Pt Education - [facility] Colon Bowel Prep 2015 Miralax/Antibiotics Restarted Neomycin Sulfate 500MG, 2 (two) Tablet SEE NOTE, #6, 05/28/2016, No Refill. Local Order: TAKE TWO TABLETS AT 2 PM, 3 PM, AND 10 PM THE DAY PRIOR TO SURGERY Continued Flagyl 500MG, 1 (one) Tablet two times daily, #14, 05/28/2016, No Refill. Local Order: Take at 2pm, 3pm, and 10pm the day prior to your colon operation Pt Education - [facility] Colectomy post-op instructions: discussed with patient and provided information. Pt Education - [facility] Good Bowel Health (Chason Mciver) Pt Education - [facility] Pain Control (Alailah Safley) Pt  Education - Pamphlet Given - Laparoscopic Colorectal Surgery: discussed with patient and provided information.  [name], M.D., F.A.C.S. Gastrointestinal and Minimally Invasive Surgery [facility] [address] [place], [place] [address] [phone] Main / Paging

## 2016-05-29 ENCOUNTER — Telehealth: Payer: Self-pay | Admitting: *Deleted

## 2016-05-29 NOTE — Telephone Encounter (Signed)
Notes faxed to [phone] at [facility] cancer center, spoke with Seth Bake [phone] notes will giving to Dr. Benay Spice to review. I will wait to hear from them.

## 2016-05-29 NOTE — Telephone Encounter (Signed)
-----   Message from [name], NP sent at 05/29/2016  1:11 PM EDT ----- [name], patient needs an urgent consult to discuss colon cancer treatment. She was diagnosed with invasive adenocarcinoma with resected polyp, her surgeon is Dr. gross who would like to proceed with surgery quickly she would like a opinion from the oncologist prior. she has scheduled a CT scan on Monday at 4:00. We need the soonest available appointment for consult to discuss treatment options with an oncologist who deals with colon cancer.

## 2016-06-01 ENCOUNTER — Ambulatory Visit
Admission: RE | Admit: 2016-06-01 | Discharge: 2016-06-01 | Disposition: A | Discharge status: Home / Self Care | Payer: Managed Care, Other (non HMO) | Source: Ambulatory Visit | Attending: Surgery | Admitting: Surgery

## 2016-06-01 ENCOUNTER — Encounter: Payer: Self-pay | Admitting: *Deleted

## 2016-06-01 DIAGNOSIS — C20 Malignant neoplasm of rectum: Secondary | ICD-10-CM

## 2016-06-01 MED ORDER — IOPAMIDOL (ISOVUE-300) INJECTION 61%
100.0000 mL | Freq: Once | INTRAVENOUS | Status: AC | PRN
  Administered 2016-06-01: 100 mL via INTRAVENOUS

## 2016-06-01 NOTE — Telephone Encounter (Signed)
Oncology Nurse Navigator Documentation  Oncology Nurse Navigator Flowsheets 06/01/2016  Navigator Location [facility]  Navigator Encounter Type Introductory phone call  Spoke with patient and provided new patient appointment for 06/02/16 at 2:30 pm with Dr. Burr Medico. Arrive at 2:15 for registration process. Informed of location of [address], valet service, and registration process. Reminded to bring photo ID, insurance cards and a current medication list, including supplements. Patient verbalizes understanding. HIM notified to enter appointment into EPIC.

## 2016-06-01 NOTE — Progress Notes (Signed)
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 low anterior resection is the standard definitive surgery for T2 rectal cancer, although her primary tumor has been resected by TEM with negative margins. We discussed there is 10-20% risk of nodal metastasis from T2 rectal cancer.  -Patient is reluctant to have second LAR due to her prior surgical complications and a prolonged recovery -Her staging CT scan showed no definitive evidence of metastasis, but some mesorectal lymph nodes are prominent, not enlarged by size. This could be related to her recent surgery, however metastatic adenopathy is not ruled out. -I'll repeat her lab including CBC, CMP and CEA today -We'll review her CT scan in our tumor board tomorrow -If she declines LAR, we'll offer concurrent chemoradiation as adjuvant therapy, to reduce her risk of recurrence.  -After a long discussion, patient is leaning towards to have LAR,which is scheduled for 07/01/2016 -I discussed the risk of cancer recurrence in the future. I discussed the surveillance plan, which is a physical exam and lab test (including CBC, CMP and CEA) every 3 months for the first 2 years, then every 6-12 months,  colonoscopy in one year, and surveilliance CT scan every 12 month for up to 5 year.   2. Left ovarian cyst  -This has been watched by her gynecologist. The size of the left ovary and cyst has slightly increased since 2014 -If patient undergo LAR for her rectal cancer, her gynecologist will likely have her left ovary and cyst removed during the same surgery  3. Genetics -She has strong family history of colon cancer  -I recommend her to seek genetic counselor, to see if she needs genetic testing to ruled out inheritable cancer syndrome  -We'll do MSI and MMR on her rectal cancer   Plan -GI tumor board discussion tomorrow, will call tomorrow after his the conference -Genetic referral -I plan to see her back 3 weeks after her surgery on 11/1  -lab today, CBC, CMP and CEA   Orders Placed This Encounter  Procedures  . CBC with Differential    Standing Status:   Standing    Number of Occurrences:   20    Standing Expiration Date:   06/02/2021  . Comprehensive metabolic panel    Standing Status:   Standing    Number of Occurrences:  20    Standing Expiration Date:   06/02/2021  . CEA    Standing Status:   Standing    Number of Occurrences:   20    Standing Expiration Date:   06/02/2021    All questions were answered. The patient knows to call the clinic with any problems, questions or concerns. I spent 55 minutes counseling the patient face to face. The total time spent in the appointment was 60 minutes and more than 50% was on counseling.     Truitt Merle, MD 06/02/2016 4:55 PM

## 2016-06-02 ENCOUNTER — Ambulatory Visit (HOSPITAL_BASED_OUTPATIENT_CLINIC_OR_DEPARTMENT_OTHER): Payer: Managed Care, Other (non HMO)

## 2016-06-02 ENCOUNTER — Ambulatory Visit (HOSPITAL_BASED_OUTPATIENT_CLINIC_OR_DEPARTMENT_OTHER): Payer: Managed Care, Other (non HMO) | Admitting: Hematology

## 2016-06-02 ENCOUNTER — Encounter: Payer: Self-pay | Admitting: *Deleted

## 2016-06-02 ENCOUNTER — Encounter: Payer: Self-pay | Admitting: Hematology

## 2016-06-02 ENCOUNTER — Telehealth: Payer: Self-pay | Admitting: Hematology

## 2016-06-02 VITALS — BP 135/80 | HR 72 | Temp 98.4°F | Resp 16 | Ht 62.5 in | Wt 193.6 lb

## 2016-06-02 DIAGNOSIS — C20 Malignant neoplasm of rectum: Secondary | ICD-10-CM

## 2016-06-02 DIAGNOSIS — N83202 Unspecified ovarian cyst, left side: Secondary | ICD-10-CM | POA: Diagnosis not present

## 2016-06-02 LAB — COMPREHENSIVE METABOLIC PANEL
ALBUMIN: 3.2 g/dL — AB (ref 3.5–5.0)
ALK PHOS: 52 U/L (ref 40–150)
ALT: 21 U/L (ref 0–55)
AST: 25 U/L (ref 5–34)
Anion Gap: 9 mEq/L (ref 3–11)
BILIRUBIN TOTAL: 0.38 mg/dL (ref 0.20–1.20)
BUN: 11.8 mg/dL (ref 7.0–26.0)
CO2: 23 mEq/L (ref 22–29)
Calcium: 9.4 mg/dL (ref 8.4–10.4)
Chloride: 112 mEq/L — ABNORMAL HIGH (ref 98–109)
Creatinine: 0.8 mg/dL (ref 0.6–1.1)
EGFR: 87 mL/min/{1.73_m2} — AB (ref >90)
GLUCOSE: 113 mg/dL (ref 70–140)
Potassium: 3.5 mEq/L (ref 3.5–5.1)
Sodium: 144 mEq/L (ref 136–145)
TOTAL PROTEIN: 7.2 g/dL (ref 6.4–8.3)

## 2016-06-02 LAB — CBC WITH DIFFERENTIAL/PLATELET
BASO%: 0.2 % (ref 0.0–2.0)
BASOS ABS: 0 10*3/uL (ref 0.0–0.1)
EOS%: 0.6 % (ref 0.0–7.0)
Eosinophils Absolute: 0.1 10*3/uL (ref 0.0–0.5)
HEMATOCRIT: 34.8 % (ref 34.8–46.6)
HGB: 11.7 g/dL (ref 11.6–15.9)
LYMPH%: 25.6 % (ref 14.0–49.7)
MCH: 28.8 pg (ref 25.1–34.0)
MCHC: 33.6 g/dL (ref 31.5–36.0)
MCV: 85.7 fL (ref 79.5–101.0)
MONO#: 1.3 10*3/uL — ABNORMAL HIGH (ref 0.1–0.9)
MONO%: 15 % — AB (ref 0.0–14.0)
NEUT#: 5 10*3/uL (ref 1.5–6.5)
NEUT%: 58.6 % (ref 38.4–76.8)
Platelets: 353 10*3/uL (ref 145–400)
RBC: 4.06 10*6/uL (ref 3.70–5.45)
RDW: 13.9 % (ref 11.2–14.5)
WBC: 8.5 10*3/uL (ref 3.9–10.3)
lymph#: 2.2 10*3/uL (ref 0.9–3.3)

## 2016-06-02 NOTE — Telephone Encounter (Signed)
Spoke with patient re appointments for 11/8 and 11/22

## 2016-06-02 NOTE — Patient Instructions (Signed)
Care Plan Summary- 06/02/2016 Name:  [name]       DOB: May 23, 1956 Your Medical Team:  Medical Oncologist:  Dr. Truitt Mcpherson Radiation Oncologist:    Surgeon:   Dr. Michael Mcpherson Type of Cancer: Adenocarcinoma Rectum  Stage/Grade: T2, Grade 2  *Exact staging of your cancer is based on size of the tumor, depth of invasion, involvement of lymph nodes or not, and whether or not the cancer has spread beyond the primary site.   Recommendations: Based on information available as of today's consult. Recommendations may change depending on the results of further tests or exams. 1) Standard of care for T2 lesion is surgery and to remove the ovarian cyst 2) If you choose not to have surgery, you will need radiation with oral chemo for 6 weeks 3) Genetics counselor appointment  4) Return to medical oncology after surgery to discuss path and follow up _______________________________________________________________________ Next Steps:  1) Labs today: CBC, Cmet and CEA 2) Genetics counselor appointment 3) Your case will be discussed in tumor board tomorrow and you will be called 4) See Dr. Burr Mcpherson 3 weeks after surgery   Questions? Linda Elks, RN, BSN at [phone]. [name] is your Oncology Nurse Navigator and is available to assist you while you're receiving your medical care at [facility].

## 2016-06-02 NOTE — Progress Notes (Signed)
Oncology Nurse Navigator Documentation  Oncology Nurse Navigator Flowsheets 06/02/2016  Navigator Location [facility]  Navigator Encounter Type -  Abnormal Finding Date 02/11/2016  Confirmed Diagnosis Date 05/14/2016  Surgery Date 05/14/2016  Patient Visit Type MedOnc;Initial  Treatment Phase Other  Barriers/Navigation Needs Education;Coordination of Care--genetics  Education Newly Diagnosed Cancer Education;Understanding Cancer/ Treatment Options  Interventions Education Method;Coordination of Care--sent message to genetics counselor to obtain override to have patient seen urgently  Education Method Verbal;Written;Teach-back  Support Groups/Services GI Support Group;[place]  Acuity Level 2  Time Spent with Patient 56  Met with patient and husband, [name] during new patient visit. Explained the role of the GI Nurse Navigator and provided New Patient Packet with information on: 1.  Colorectal cancer--info on CEA test, anatomy of GI tract 2. Support groups 3. Advanced Directives 4. Fall Safety Plan Answered questions, reviewed current treatment plan using TEACH back and provided emotional support. Provided copy of current treatment plan. Escorted patient to the lab and per MD request has been added to GI Tumor Board discussion tomorrow. [name] has three grown children, all local except a daughter in [place]. She is employed as a Air traffic controller full time. Independent in all ADLs. She is struggling in her decision about surgery-has had a unpleasant experience with surgery in 2007. Will follow up to ensure she sees genetics soon and returns to see Dr. Burr Medico postoperatively if she proceed with surgery.  [name], RN, BSN GI Oncology [place]

## 2016-06-03 ENCOUNTER — Telehealth: Payer: Self-pay | Admitting: *Deleted

## 2016-06-03 LAB — CEA (IN HOUSE-CHCC): CEA (CHCC-In House): 1.02 ng/mL (ref 0.00–5.00)

## 2016-06-03 NOTE — Telephone Encounter (Signed)
Oncology Nurse Navigator Documentation  Oncology Nurse Navigator Flowsheets 06/03/2016  Navigator Location [facility]  Navigator Encounter Type Telephone  Telephone Appt Confirmation/Clarification;Patient Update  Abnormal Finding Date -  Confirmed Diagnosis Date -  Surgery Date -  Patient Visit Type -  Treatment Phase -  Barriers/Navigation Needs Coordination of Care--made her aware of consensus of GI Tumor Board today: surgery is in her best interest according to all physicians there. She understands and agrees.  Education -  Interventions Referrals--  Referrals Genetics--will see genetics on 06/09/16 at 11:00. Offered her appointment today at 1 pm, but she is not able to make this appointment.  Education Method -  Support Groups/Services -  Acuity -  Time Spent with Patient 15  Also provided her the results of her lab studies on 06/02/16.

## 2016-06-04 ENCOUNTER — Ambulatory Visit: Payer: Managed Care, Other (non HMO) | Admitting: Hematology

## 2016-06-09 ENCOUNTER — Ambulatory Visit (HOSPITAL_BASED_OUTPATIENT_CLINIC_OR_DEPARTMENT_OTHER): Payer: Managed Care, Other (non HMO) | Admitting: Genetic Counselor

## 2016-06-09 ENCOUNTER — Other Ambulatory Visit: Payer: Managed Care, Other (non HMO)

## 2016-06-09 DIAGNOSIS — C20 Malignant neoplasm of rectum: Secondary | ICD-10-CM | POA: Diagnosis not present

## 2016-06-09 DIAGNOSIS — Z8041 Family history of malignant neoplasm of ovary: Secondary | ICD-10-CM | POA: Diagnosis not present

## 2016-06-09 DIAGNOSIS — Z8049 Family history of malignant neoplasm of other genital organs: Secondary | ICD-10-CM | POA: Diagnosis not present

## 2016-06-09 DIAGNOSIS — Z800 Family history of malignant neoplasm of digestive organs: Secondary | ICD-10-CM | POA: Diagnosis not present

## 2016-06-09 DIAGNOSIS — Z315 Encounter for genetic counseling: Secondary | ICD-10-CM

## 2016-06-09 DIAGNOSIS — Z803 Family history of malignant neoplasm of breast: Secondary | ICD-10-CM

## 2016-06-09 DIAGNOSIS — Z8042 Family history of malignant neoplasm of prostate: Secondary | ICD-10-CM

## 2016-06-09 DIAGNOSIS — Z8371 Family history of colonic polyps: Secondary | ICD-10-CM

## 2016-06-10 ENCOUNTER — Encounter: Payer: Self-pay | Admitting: Genetic Counselor

## 2016-06-10 ENCOUNTER — Telehealth: Payer: Self-pay | Admitting: *Deleted

## 2016-06-10 ENCOUNTER — Other Ambulatory Visit: Payer: Self-pay | Admitting: Urology

## 2016-06-10 DIAGNOSIS — Z8041 Family history of malignant neoplasm of ovary: Secondary | ICD-10-CM | POA: Insufficient documentation

## 2016-06-10 DIAGNOSIS — Z800 Family history of malignant neoplasm of digestive organs: Secondary | ICD-10-CM | POA: Insufficient documentation

## 2016-06-10 DIAGNOSIS — Z8049 Family history of malignant neoplasm of other genital organs: Secondary | ICD-10-CM | POA: Insufficient documentation

## 2016-06-10 DIAGNOSIS — Z803 Family history of malignant neoplasm of breast: Secondary | ICD-10-CM | POA: Insufficient documentation
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
## 2016-06-10 NOTE — Telephone Encounter (Signed)
Per Domingo Dimes at [facility] Ref call [phone]  [place] and visit are covered with a $40 copay. KW

## 2016-06-11 ENCOUNTER — Other Ambulatory Visit: Payer: Self-pay | Admitting: Urology

## 2016-06-17 ENCOUNTER — Ambulatory Visit (INDEPENDENT_AMBULATORY_CARE_PROVIDER_SITE_OTHER): Payer: Managed Care, Other (non HMO) | Admitting: Gynecology

## 2016-06-17 ENCOUNTER — Other Ambulatory Visit: Payer: Self-pay | Admitting: Gynecology

## 2016-06-17 ENCOUNTER — Ambulatory Visit (INDEPENDENT_AMBULATORY_CARE_PROVIDER_SITE_OTHER): Payer: Managed Care, Other (non HMO)

## 2016-06-17 VITALS — BP 134/78

## 2016-06-17 DIAGNOSIS — D251 Intramural leiomyoma of uterus: Secondary | ICD-10-CM

## 2016-06-17 DIAGNOSIS — N83202 Unspecified ovarian cyst, left side: Secondary | ICD-10-CM

## 2016-06-17 DIAGNOSIS — Z8742 Personal history of other diseases of the female genital tract: Secondary | ICD-10-CM

## 2016-06-17 DIAGNOSIS — Z85048 Personal history of other malignant neoplasm of rectum, rectosigmoid junction, and anus: Secondary | ICD-10-CM

## 2016-06-17 NOTE — Progress Notes (Signed)
Patient is a 60 year old gravida 3 para 3 (deceased 3 cesarean sections) who was seen in the office on 02/19/2016 as a result of a persistent right ovarian cyst. 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 She had follow-up colonoscopy is in 2009 in 2012. The anastomosis was reported to have been intact above the anal verge and she had a follow-up colonoscopy with Dr. Benson Norway whereby he detected a polypoid and sessile nonobstructing large mass found in the rectosigmoid colon the mass was described as being non-circumferential. And the mass measured 3 cm in length. This was biopsied by him. At the surgical anastomosis he described also a large polypoid mass. This was 3 cm from the anal verge. This lesion incorporating 40% of the luminal circumference. The pathology report demonstrated that the area biopsied was fragments of a tubovillous adenoma no high-grade dysplasia or malignancy.   The note from Dr. Johney Maine general surgeon had reported the following: "Rectal cancer within a large polyp at/slightly distal to the colorectal anastomosis from prior resection for debulking rectal adenomatous polyp. Therefore, Status post TEM resection. T2 cancer. Margins negative.  Standard of care is low anterior resection. This would be very low with probable coloanal handsewn anastomosis and protective loop ileostomy.  Challenge with her she's already had a prior low anterior resection for a bulky polyp. Required vaginal repair. Another challenge will be getting adequate lymph nodes for this but hopefully with more complete better mesorectal excision and as well as ensuring high ligation of the IMA/IMV I will get better sampling/staging."  Patient has had a persistently present left ovarian cyst less than 4 cm in diameter being echo-free and thinwall cyst 2014 with normal CA 125's and  most recently 3 months ago in normal ROMA-1 ovarian cancer screening analysis.  She is here to discuss the ultrasound from today. She had been given Depo-Provera 150 mg IM 3 months ago to see of this cyst were resolved. Her ultrasound today demonstrated the following: Uterus measures 6.7 x 5.7 x 2.9 cm with endometrial stripe of 1.9 mm. Patient with past history of endometrial ablation some fluid was noted in the endometrium. She had several intramural fibroids 12 x 9 mm, 13 x 15 mm, 15 x 12 mm, 24 x 60 mm, 15 x 9 mm. Right ovary was normal. Left ovarian tissue thinwall echo-free cyst measuring 4.3 x 4.0 x 3.6 cm average size 4.0 cm unchanged over the past 3 years. Negative color flow Doppler. Her tear blood flow seen to the left ovary. No fluid in the cul-de-sac.  Patient on October 10 for genetic counseling as a result of the following: PRIMARY REASON FOR VISIT:  1. Rectal cancer ([place])   2. Family history of colon cancer   3. Family history of ovarian cancer   4. Family history of uterine cancer   5. Family history of breast cancer in female   70. Family history of colonic polyps   7. Family history of prostate cancer    The following genetic screening tests was obtained and results will be available this week: The 42-gene [employer]ae Common Hereditary Cancers Panel (Breast, Gyn, GI) performed by [facility], [place]) includes sequencing and/or deletion/duplication analysis for the following genes: APC, ATM, AXIN2, BARD1, BMPR1A, BRCA1, BRCA2, BRIP1, CDH1, CDKN2A, CHEK2, DICER1, EPCAM, GREM1, KIT, MEN1, MLH1, MSH2, MSH6, MUTYH, NBN, NF1, PALB2,  PDGFRA, PMS2, POLD1, POLE, PTEN, RAD50, RAD51C, RAD51D, SDHA, SDHB, SDHC, SDHD, SMAD4, SMARCA4, STK11, TP53, TSC1, TSC2, and VHL.  I discussed with the patient that if the general surgeon was contemplating on doing an open laparotomy which they are planning so on November 1 that consideration be made to remove both tubes and ovary at the same  time but of the genetic testing is negative since the small insignificant since his been present for 3 years with normal ovarian cancer screening tests that he could be followed on a yearly basis with ultrasound. We'll wait to see what the results of the genetic testing comes back and try to coordinate with our GYN oncologist to see if she could assistant Iris by doing the BSO at time of the planned surgery by the general surgeons. We'll make an appointment for her to see her and hopefully this can be coordinated.  Rated 50% time was spent calcium coordinate care for this patient. Time of consultation 15 minutes.

## 2016-06-17 NOTE — Patient Instructions (Signed)
Ovarian Cyst An ovarian cyst is a fluid-filled sac that forms on an ovary. The ovaries are small organs that produce eggs in women. Various types of cysts can form on the ovaries. Most are not cancerous. Many do not cause problems, and they often go away on their own. Some may cause symptoms and require treatment. Common types of ovarian cysts include:  Functional cysts--These cysts may occur every month during the menstrual cycle. This is normal. The cysts usually go away with the next menstrual cycle if the woman does not get pregnant. Usually, there are no symptoms with a functional cyst.  Endometrioma cysts--These cysts form from the tissue that lines the uterus. They are also called "chocolate cysts" because they become filled with blood that turns brown. This type of cyst can cause pain in the lower abdomen during intercourse and with your menstrual period.  Cystadenoma cysts--This type develops from the cells on the outside of the ovary. These cysts can get very big and cause lower abdomen pain and pain with intercourse. This type of cyst can twist on itself, cut off its blood supply, and cause severe pain. It can also easily rupture and cause a lot of pain.  Dermoid cysts--This type of cyst is sometimes found in both ovaries. These cysts may contain different kinds of body tissue, such as skin, teeth, hair, or cartilage. They usually do not cause symptoms unless they get very big.  Theca lutein cysts--These cysts occur when too much of a certain hormone (human chorionic gonadotropin) is produced and overstimulates the ovaries to produce an egg. This is most common after procedures used to assist with the conception of a baby (in vitro fertilization). CAUSES   Fertility drugs can cause a condition in which multiple large cysts are formed on the ovaries. This is called ovarian hyperstimulation syndrome.  A condition called polycystic ovary syndrome can cause hormonal imbalances that can lead to  nonfunctional ovarian cysts. SIGNS AND SYMPTOMS  Many ovarian cysts do not cause symptoms. If symptoms are present, they may include:  Pelvic pain or pressure.  Pain in the lower abdomen.  Pain during sexual intercourse.  Increasing girth (swelling) of the abdomen.  Abnormal menstrual periods.  Increasing pain with menstrual periods.  Stopping having menstrual periods without being pregnant. DIAGNOSIS  These cysts are commonly found during a routine or annual pelvic exam. Tests may be ordered to find out more about the cyst. These tests may include:  Ultrasound.  X-ray of the pelvis.  CT scan.  MRI.  Blood tests. TREATMENT  Many ovarian cysts go away on their own without treatment. Your health care provider may want to check your cyst regularly for 2-3 months to see if it changes. For women in menopause, it is particularly important to monitor a cyst closely because of the higher rate of ovarian cancer in menopausal women. When treatment is needed, it may include any of the following:  A procedure to drain the cyst (aspiration). This may be done using a long needle and ultrasound. It can also be done through a laparoscopic procedure. This involves using a thin, lighted tube with a tiny camera on the end (laparoscope) inserted through a small incision.  Surgery to remove the whole cyst. This may be done using laparoscopic surgery or an open surgery involving a larger incision in the lower abdomen.  Hormone treatment or birth control pills. These methods are sometimes used to help dissolve a cyst. HOME CARE INSTRUCTIONS   Only take over-the-counter   or prescription medicines as directed by your health care provider.  Follow up with your health care provider as directed.  Get regular pelvic exams and Pap tests. SEEK MEDICAL CARE IF:   Your periods are late, irregular, or painful, or they stop.  Your pelvic pain or abdominal pain does not go away.  Your abdomen becomes  larger or swollen.  You have pressure on your bladder or trouble emptying your bladder completely.  You have pain during sexual intercourse.  You have feelings of fullness, pressure, or discomfort in your stomach.  You lose weight for no apparent reason.  You feel generally ill.  You become constipated.  You lose your appetite.  You develop acne.  You have an increase in body and facial hair.  You are gaining weight, without changing your exercise and eating habits.  You think you are pregnant. SEEK IMMEDIATE MEDICAL CARE IF:   You have increasing abdominal pain.  You feel sick to your stomach (nauseous), and you throw up (vomit).  You develop a fever that comes on suddenly.  You have abdominal pain during a bowel movement.  Your menstrual periods become heavier than usual. MAKE SURE YOU:  Understand these instructions.  Will watch your condition.  Will get help right away if you are not doing well or get worse.   This information is not intended to replace advice given to you by your health care provider. Make sure you discuss any questions you have with your health care provider.   Document Released: 08/17/2005 Document Revised: 08/22/2013 Document Reviewed: 04/24/2013 Elsevier Interactive Patient Education 2016 [employer]  

## 2016-06-18 ENCOUNTER — Telehealth: Payer: Self-pay | Admitting: Genetic Counselor

## 2016-06-18 ENCOUNTER — Ambulatory Visit: Payer: Self-pay | Admitting: General Surgery

## 2016-06-18 NOTE — Telephone Encounter (Signed)
Discussed with [name] that her genetic test results were negative for mutations within any of 42 genes on the [employer] Common Hereditary Cancers Panel (Breast, Gyn, GI).  Additionally, no uncertain changes were found.  Reviewed that this seems to be a reassuring result, especially since there are still many people in her family who have lived to later ages and have never had cancer.  Discussed that her brother and sister could still have genetic testing, if interested.  [name] should continue to follow her doctors' recommendations.  Her children can get their first colonoscopy at 40 due to her history of colorectal cancer.  [name] is happy to receive this news.  She knows she is welcome to call with any questions she may have.  She would like a copy of her test results mailed and I am happy to do that.

## 2016-06-18 NOTE — H&P (Signed)
[name] 05/28/2016 3:52 PM Location: [facility] Patient #: [id number] DOB: 1956-01-27 Married / Language: English / Race: White Female   History of Present Illness Linda Hector Mcpherson; 05/28/2016 6:12 PM) The patient is a 60 year old female who presents with colorectal cancer. Note for "Colorectal cancer": Patient returns status post TEM resection of bulky posterior rectal wall Mcpherson at and mostly distal to the colorectal anastomosis.   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 Discuss with her at length earlier this week on the phone. She comes in today with her husband and daughter. They have many questions. Patient is appetite is fair. She is eating most solid foods but trying to use smaller bites. Has a fair amount of fecal urgency. Some chronic pain on her tailbone like she had a bad hemorrhoidectomy. No incontinence. Had worsening pain and difficulty with urination. Harder relaxing urinate. I started her on some antibiotics. She just started a dose or 2. She had been urinating okay last week. No major bleeding. No fevers or chills or sweats. Had a low-grade temperature around 100.4 at one point. Not now. She is tired but not toxic.  They many questions about diagnosis and treatment. Need for further interventions. Need for second opinion.     05/14/2016  PATIENT: [name] 60 y.o. female  Patient Care Team: Linda Cote, NP as PCP - General (Obstetrics and Gynecology) Linda Boston, Mcpherson as Consulting Physician (General Surgery) Linda Ada, Mcpherson as Consulting Physician (Gastroenterology) Linda Mass, Mcpherson as Consulting Physician (Gynecology)  PRE-OPERATIVE DIAGNOSIS: RECURRENT RECTAL POLYP  POST-OPERATIVE DIAGNOSIS: RECURRENT RECTAL POLYP at colorectal anastomosis  PROCEDURE: PARTIAL PROCTECTOMY BY TEM OF RECTAL Mcpherson  [name]: [name](s): Linda Boston, Mcpherson  ASSISTANT: [name], [name]  ANESTHESIA: local and general  EBL: Total I/O In: - Out: 100 [Urine:50; Blood:50]  Delay start of Pharmacological VTE agent (>24hrs) due to surgical blood loss or risk of bleeding: no  DRAINS: none  SPECIMEN: Source of Specimen: Polyp at colorectal junction  DISPOSITION OF SPECIMEN: PATHOLOGY  COUNTS: YES  PLAN OF CARE: Admit for overnight observation  PATIENT DISPOSITION: PACU - hemodynamically stable.  INDICATION: Patient status post low anterior resection for large rectal polyp in 2007. Required vaginal repair & loop ileostomy diversion. Had loop ileostomy takedown. Had hernia repair. Found to have recurrent polyp at colorectal anastomosis along posterior midline/left lateral. She refused another attempt at a low anterior resection. Because there is no definite proof of cancer, I offered exam under anesthesia with TEM resection.  OR FINDINGS: Bulky 6 x 5 cm polyp in the posterior midline rectum including 40% of the colorectal anastomosis. Most of the polyp on the rectal side.   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), GRADE 2 THE TUMOR INVADES MUSCULARIS PROPRIA (PT2) ALL MARGINS OF RESECTION ARE NEGATIVE FOR CARCINOMA ONE BENIGN LYMPH NODE (0/1) Microscopic Comment COLON AND RECTUM (INCLUDING TRANS-ANAL RESECTION): Specimen: Rectum Procedure: Resection Tumor site: Rectum Specimen integrity: Intact Macroscopic intactness of mesorectum: Not applicable: X Complete: NA Near complete: NA Incomplete: NA Cannot be determined (specify): NA Macroscopic tumor perforation: In Invasive tumor: Maximum size: 3.5 cm Histologic type(s): adenocarcinoma Histologic grade and  differentiation: G1: well differentiated/low grade G2: moderately differentiated/low  grade G3: poorly differentiated/high grade G4: undifferentiated/high grade Type of polyp in which invasive carcinoma arose: Tubular adenoma Microscopic extension of invasive tumor:Muscularis propria Lymph-Vascular invasion: Negative 1 of 5 Supplemental copy SUPPLEMENTAL for [name] ([id number]) Microscopic Comment(continued) Peri-neural invasion: Negative Tumor deposit(s) (discontinuous extramural extension): Negative Resection margins: Proximal margin: Negative Distal margin: Negative Circumferential (radial) (posterior ascending, posterior descending; lateral and posterior mid-rectum; and entire lower 1/3 rectum):Negative Mesenteric margin (sigmoid and transverse): NA Distance closest margin (if all above margins negative): 0.4 cm Trans-anal resection margins only: Deep margin: Negative Mucosal Margin: Negative Distance closest mucosal margin (if negative): 0.4 cm Treatment effect (neo-adjuvant therapy): negative Additional polyp(s): Negative Non-neoplastic findings: unremarkable Lymph nodes: number examined 1; number positive: 0 Pathologic Staging: pT2, N0, Mx Ancillary studies: MSI ordered [name] Pathologist, Electronic Signature (Case signed 05/19/2016) Specimen [name] and Clinical Information Specimen(s) Obtained: Rectum, resection, polyp Specimen Clinical Information recurrent rectal polyp [rd] [name] Received fresh is the product of a transanal resection of rectal polyp, which is oriented by the clinician as follows: blue pins at proximal, red pins at distal, yellow pins at left lateral, and white pins at right lateral. The specimen is 8.8 cm from proximal to distal, 7.4 cm from left lateral to right lateral, and the specimen is excised up to 1 cm deep, with areas of soft fatty tissue on the deep surface. The central specimen has a 6.5 x 6.5 cm area of tan red  nodular and granular mucosa, with a central fungating polypoid area, 3.5 x 3 cm and is up to 1.2 cm. On sectioning, there is underlying ill defined, but no definite invasion is identified at time of [name]. Found within the lesion are multiple staples consistent with previous anastomosis. The specimen is inked as follows: proximal blue, distal red, left lateral yellow, right lateral green, deep margin black. Representative sections of this tissue are submitted as follows: A-Mcpherson= full thickness central sections of lesion, to include deep margin only. H,I= radial sections of nearest superior margin. J,K= radial sections of nearest left lateral margin. L,M= radial sections of nearest distal margin. N,O= radial sections of right lateral margin. Also in the same container is a 10.9 cm in length strip of tan pink to hyperemic focally granular mucosa, ranging in width from 0.4 to 0.8 cm, and ranges in thickness from 0.4 to 0.7 cm. The specimen is oriented, clinically identifying as 2 of 5 Supplemental copy SUPPLEMENTAL for Grygiel, Yazmyne Mcpherson ([id number]) Brynlyn Dade(continued) additional left lateral margin, with blue pin at most proximal and red pin at most distal. The new left lateral margin is inked orange, and opposite (old corresponding margin) inked yellow, and representative sections of this strip of tissue are sequentially submitted from proximal to distal in blocks P-T. Total 20 blocks. (SW:gt, 05/15/16) Disclaimer Some of these immunohistochemical stains may have been developed and the performance characteristics determined by [facility]. Some may not have been cleared or approved by the U.S. Food and Drug Administration. The FDA has determined that such clearance or approval is not necessary. This test is used for clinical purposes. It should not be regarded as investigational or for research. This laboratory is certified under the Clinical Laboratory Improvement Amendments of 1988  (CLIA-88) as qualified to perform high complexity clinical laboratory testing. Report signed out from the following location(s) Technical Component was performed at [facility] [address],[place] [address].CLIA:[id number],CAP:[id number]., Interpretation was performed at [place] [address], [place],  [address].   CLIA #: [id number], 3 of   Allergies Elbert Ewings, [place]; 05/28/2016 3:52 PM) Macrobid *URINARY ANTI-INFECTIVES*  Medication History Elbert Ewings, [place]; 05/28/2016 3:53 PM) Cipro (500MG Tablet, 1 (one) Tablet Oral two times daily, Taken starting 05/27/2016) Active. Flagyl (500MG Tablet, 1 (one) Tablet Oral two times daily, Taken starting 05/27/2016) Active. (Take at 2pm, 3pm, and 10pm the day prior to your colon operation) OxyCODONE HCl (5MG Tablet, 1-2 Tablet Oral every four hours, as needed for pain, Taken starting 05/27/2016) Active. Medications Reconciled Multiple Vitamins (Oral) Active.  Vitals Elbert Ewings [name]; 05/28/2016 3:53 PM) 05/28/2016 3:53 PM Weight: 197 lb Height: 63in Body Surface Area: 1.92 m Body Mcpherson Index: 34.9 kg/m  Temp.: 97.103F(Temporal)  Pulse: 90 (Regular)  BP: 130/74 (Sitting, Left Arm, Standard)       Physical Exam Linda Hector Mcpherson; 05/28/2016 6:12 PM) General Mental Status-Alert. General Appearance-Not in acute distress. Voice-Normal.  Integumentary Global Assessment Upon inspection and palpation of skin surfaces of the - Distribution of scalp and body hair is normal. General Characteristics Overall examination of the patient's skin reveals - no rashes and no suspicious lesions.  Head and Neck Head-normocephalic, atraumatic with no lesions or palpable masses. Face Global Assessment - atraumatic, no absence of expression. Neck Global Assessment - no abnormal movements, no decreased range of motion. Trachea-midline. Thyroid Gland Characteristics -  non-tender.  Eye Eyeball - Left-Extraocular movements intact, No Nystagmus. Eyeball - Right-Extraocular movements intact, No Nystagmus. Upper Eyelid - Left-No Cyanotic. Upper Eyelid - Right-No Cyanotic.  Chest and Lung Exam Inspection Accessory muscles - No use of accessory muscles in breathing.  Abdomen Note: Abdomen soft. Nontender, nondistended. No guarding. No umbilical no other hernias   Rectal Note: Held off on any digital rectal exam today.   Peripheral Vascular Upper Extremity Inspection - Left - Not Gangrenous, No Petechiae. Right - Not Gangrenous, No Petechiae.  Neurologic Neurologic evaluation reveals -normal attention span and ability to concentrate, able to name objects and repeat phrases. Appropriate fund of knowledge and normal coordination.  Neuropsychiatric Mental status exam performed with findings of-able to articulate well with normal speech/language, rate, volume and coherence and no evidence of hallucinations, delusions, obsessions or homicidal/suicidal ideation. Orientation-oriented X3.  Musculoskeletal Global Assessment Gait and Station - normal gait and station.  Lymphatic General Lymphatics Description - No Generalized lymphadenopathy.    Assessment & Plan Linda Hector Mcpherson; 05/28/2016 6:15 PM) RECTAL CANCER (C20) Impression: Rectal cancer within a large polyp at/slightly distal to the colorectal anastomosis from prior resection for debulking rectal adenomatous polyp. Therefore, Status post TEM resection. T2 cancer. Margins negative.  Standard of care is low anterior resection. This would be very low with probable coloanal handsewn anastomosis and protective loop ileostomy.  Challenge with her she's already had a prior low anterior resection for a bulky polyp. Required vaginal repair. Another challenge will be getting adequate lymph nodes for this but hopefully with more complete better mesorectal excision and as well as  ensuring high ligation of the IMA/IMV I will get better sampling/staging.  I spent over 45 minutes talking with the patient and her family about numerous issues.  I recommend she continue pain control. Okay to use oxycodone as needed. Continue ibuprofen. Warm soaks.  Recommend she control diarrhea more aggressively as possible. No upper limit on the Imodium. She is worried about getting constipated. I noted to gradually increased Imodium as tolerated to see if that helps.  Consider flaxseed MiraLAX. Continue Gas-X as needed. Trying decrease flatulence. Hopefully that  will help as well. PREOP COLON - ENCOUNTER FOR PREOPERATIVE EXAMINATION FOR GENERAL SURGICAL PROCEDURE (Z01.818) Current Plans You are being scheduled for surgery - Our schedulers will call you.  You should hear from our office's scheduling department within 5 working days about the location, date, and time of surgery. We try to make accommodations for patient's preferences in scheduling surgery, but sometimes the OR schedule or the [name]'s schedule prevents us from making those accommodations.  If you have not heard from our office ([phone]) in 5 working days, call the office and ask for your [name]'s nurse.  If you have other questions about your diagnosis, plan, or surgery, call the office and ask for your [name]'s nurse.  Written instructions provided Pt Education - [facility] Colon Bowel Prep 2015 Miralax/Antibiotics Restarted Neomycin Sulfate 500MG, 2 (two) Tablet SEE NOTE, #6, 05/28/2016, No Refill. Local Order: TAKE TWO TABLETS AT 2 PM, 3 PM, AND 10 PM THE DAY PRIOR TO SURGERY Continued Flagyl 500MG, 1 (one) Tablet two times daily, #14, 05/28/2016, No Refill. Local Order: Take at 2pm, 3pm, and 10pm the day prior to your colon operation Pt Education - [facility] Colectomy post-op instructions: discussed with patient and provided information. Pt Education - [facility] Good Bowel Health (Kaislee Chao) Pt Education - [facility] Pain Control  (Angeligue Bowne) Pt Education - Pamphlet Given - Laparoscopic Colorectal Surgery: discussed with patient and provided information.   ADDENDUM:    Concern by gynecology Dr. Fernandez to remove at least one if not both ovaries given a large ovarian cyst and strong family history of cancers in this postmenopausal woman.  Gynecological oncology not have able to be available that day.  I will proceed with bilateral oophrectomies at the same time. 

## 2016-06-19 ENCOUNTER — Ambulatory Visit: Payer: Self-pay | Admitting: Genetic Counselor

## 2016-06-19 DIAGNOSIS — Z803 Family history of malignant neoplasm of breast: Secondary | ICD-10-CM

## 2016-06-19 DIAGNOSIS — Z8041 Family history of malignant neoplasm of ovary: Secondary | ICD-10-CM

## 2016-06-19 DIAGNOSIS — Z1379 Encounter for other screening for genetic and chromosomal anomalies: Secondary | ICD-10-CM

## 2016-06-19 DIAGNOSIS — C20 Malignant neoplasm of rectum: Secondary | ICD-10-CM

## 2016-06-19 DIAGNOSIS — Z800 Family history of malignant neoplasm of digestive organs: Secondary | ICD-10-CM

## 2016-06-19 DIAGNOSIS — Z8049 Family history of malignant neoplasm of other genital organs: Secondary | ICD-10-CM

## 2016-06-22 ENCOUNTER — Telehealth: Payer: Self-pay | Admitting: *Deleted

## 2016-06-22 NOTE — Telephone Encounter (Addendum)
Pt called c/o vaginal burning and burning with urination asked if taking Cipro would help with this,pt had pills that was prescribed by another physician. I explained to pt no urine was done on office visit on 06/17/16. May be or may not be UTI, best to schedule office visit with provider to exam and treat the correct infection. Pt verbalized she understood states she will try some OTC items such as monistat.

## 2016-06-23 ENCOUNTER — Encounter: Payer: Self-pay | Admitting: Women's Health

## 2016-06-23 ENCOUNTER — Other Ambulatory Visit: Payer: Self-pay | Admitting: Women's Health

## 2016-06-23 ENCOUNTER — Other Ambulatory Visit: Payer: Managed Care, Other (non HMO)

## 2016-06-23 ENCOUNTER — Ambulatory Visit (INDEPENDENT_AMBULATORY_CARE_PROVIDER_SITE_OTHER): Payer: Managed Care, Other (non HMO) | Admitting: Women's Health

## 2016-06-23 VITALS — BP 128/82 | Ht 62.0 in

## 2016-06-23 DIAGNOSIS — R300 Dysuria: Secondary | ICD-10-CM

## 2016-06-23 DIAGNOSIS — R350 Frequency of micturition: Secondary | ICD-10-CM

## 2016-06-23 DIAGNOSIS — N3001 Acute cystitis with hematuria: Secondary | ICD-10-CM

## 2016-06-23 LAB — URINALYSIS W MICROSCOPIC + REFLEX CULTURE
BILIRUBIN URINE: NEGATIVE
CRYSTALS: NONE SEEN [HPF]
Casts: NONE SEEN [LPF]
GLUCOSE, UA: NEGATIVE
Ketones, ur: NEGATIVE
Nitrite: NEGATIVE
Specific Gravity, Urine: >1.03 (ref 1.001–1.035)
Yeast: NONE SEEN [HPF]
pH: 5 (ref 5.0–8.0)

## 2016-06-23 MED ORDER — FLUCONAZOLE 150 MG PO TABS: 150.0000 mg | ORAL_TABLET | Freq: Once | ORAL | 1 refills | Status: AC

## 2016-06-23 MED ORDER — SULFAMETHOXAZOLE-TRIMETHOPRIM 800-160 MG PO TABS: 1.0000 | ORAL_TABLET | Freq: Two times a day (BID) | ORAL | 0 refills | Status: DC

## 2016-06-23 NOTE — Progress Notes (Signed)
Presents with complaint of increased urinary frequency, urgency, pain and burning with urination for the past 2 days with increasing intensity. Has had frequent diarrhea, rectal pain from rectal polypectomy, scheduled for colectomy for colon cancer November 1. Denies fever, vaginal discharge, itching or odor. Postmenopausal/no bleeding/no HRT.  Exam: Appears uncomfortable. UA: +2 blood, +1 leukocytes, 40-60 WBCs, 20-40 RBCs, many bacteria. No CVAT.  UTI with hematuria  Plan: Septra twice daily for 3 days prescription, proper use given and reviewed. Instructed to call if no relief of symptoms. Aware of UTI prevention. Urine culture pending. Diflucan 150 times one dose to be used when necessary if vaginal itching occurs. Scheduled for preop bowel prep of Flagyl, Cipro and MiraLAX October 31.

## 2016-06-23 NOTE — Progress Notes (Signed)
Pt called with urinary frequency and burning. No vag discharge, scheduled for colon resection/Ca July 01, 2016.

## 2016-06-23 NOTE — Patient Instructions (Signed)

## 2016-06-24 ENCOUNTER — Telehealth: Payer: Self-pay | Admitting: *Deleted

## 2016-06-24 NOTE — Telephone Encounter (Signed)
Appointment on 06/29/16 @ 12:00am with Dr.Rossi pt informed with this.

## 2016-06-24 NOTE — Telephone Encounter (Signed)
I had talked to Dr. Denman George last week as well. Make sure appointment has been made for this week.  I called and spoke with Melissa at [facility] office and she is going to contact pt to schedule appointment.

## 2016-06-25 LAB — URINE CULTURE

## 2016-06-26 NOTE — Progress Notes (Signed)
NEED CONSENT ORDER IN EPIC FOR 07-01-16 SURGERY, PRE OP IS 06-29-16 , ONLY NEED CONSENT ORDER ALL OTHER ORDERS ARE IN. THANKS

## 2016-06-26 NOTE — Progress Notes (Signed)
Cancel needing consent order found it sorry, thanks

## 2016-06-26 NOTE — Patient Instructions (Addendum)
[name]  06/26/2016   Your procedure is scheduled on: 07/01/2016    Report to [facility] Main  Entrance take [place]  elevators to 3rd floor to  [place] at  [place] AM.  Call this number if you have problems the morning of surgery [phone]   Remember: ONLY 1 PERSON MAY GO WITH YOU TO SHORT STAY TO GET  READY MORNING OF [facility].  Do not eat food  :After Midnight clear liquids all day Tuesday and follow dr gross bowel prep instructions, drink plenty of clear liquids to avoid dehydration with bowel prep     Take these medicines the morning of surgery with A SIP OF WATER: none                               You may not have any metal on your body including hair pins and              piercings  Do not wear jewelry, make-up, lotions, powders or perfumes, deodorant             Do not wear nail polish.  Do not shave  48 hours prior to surgery.     Do not bring valuables to the hospital. [place].  Contacts, dentures or bridgework may not be worn into surgery.  Leave suitcase in the car. After surgery it may be brought to your room.                 Please read over the following fact sheets you were given: _____________________________________________________________________                CLEAR LIQUID DIET   Foods Allowed                                                                     Foods Excluded  Coffee and tea, regular and decaf                             liquids that you cannot  Plain Jell-O in any flavor                                             see through such as: Fruit ices (not with fruit pulp)                                     milk, soups, orange juice  Iced Popsicles                                    All solid food Carbonated beverages, regular and diet  Cranberry, grape and apple juices Sports drinks like Gatorade Lightly  seasoned clear broth or consume(fat free) Sugar, honey syrup  Sample Menu Breakfast                                Lunch                                     Supper Cranberry juice                    Beef broth                            Chicken broth Jell-O                                     Grape juice                           Apple juice Coffee or tea                        Jell-O                                      Popsicle                                                Coffee or tea                        Coffee or tea  _____________________________________________________________________         ________________________________________________________________________  COLON BOWEL PREP Please follow the instructions carefully. It is important to clean out your bowels & take the prescribed antibiotic pills to lower your chances of a wound infection or abscess.   FIVE DAYS PRIOR TO YOUR SURGERY Stop eating any nuts, popcorn, or fruit with seeds. Stop all fiber supplements such as Metamucil, Citrucel, etc.   Hold taking any blood thinning anticoagulation medication (ex: aspirin, warfarin/Coumadin, Plavix, Xarelto, Eliquis, Pradaxa, etc) as recommended by your medical/cardiology doctor  Obtain what you need at a pharmacy of your choice: -Filled out prescriptions for your oral antibiotics (Neomycin & Metronidazole)  -A bottle of MiraLax / Glycolax (288g) - no prescription required  -A large bottle of Gatorade / Powerade (64oz)  -Dulcolax tablets (4 tabs) - no prescription required   DAY PRIOR TO SURGERY   7:00am Swallow 4 Dulcolax tablets with some water Drink plenty of clear liquids all day to avoid getting dehydrated (Water, juice, soda, coffee, tea, bouillon, jello, etc.)  10:00am Mix the bottle of MiraLax with the 64-oz bottle of Gatorade.  Drink the Gatorade mixture gradually over the next few hours (8oz glass every 15-30 minutes) until gone. You should finish by  2pm.  2:00pm Take 2 Neomycin 500mg  tablets & 2 Metronidazole 500mg  tablets  3:00pm Take 2 Neomycin 500mg  tablets & 2 Metronidazole 500mg  tablets  Drink plenty of clear liquids all  evening to avoid getting dehydrated  10:00pm Take 2 Neomycin 500mg  tablets & 2 Metronidazole 500mg  tablets  Do not eat or drink anything after bedtime (midnight) the night before your surgery.   MORNING OF SURGERY Remember to not to drink or eat anything that morning  Hold or take medications as recommended by the hospital staff at your Preoperative visit  If you have questions or concerns, please call [facility] [phone] during business hours to speak to the clinical staff for advice. [place] - Preparing for Surgery Before surgery, you can play an important role.  Because skin is not sterile, your skin needs to be as free of germs as possible.  You can reduce the number of germs on your skin by washing with CHG (chlorahexidine gluconate) soap before surgery.  CHG is an antiseptic cleaner which kills germs and bonds with the skin to continue killing germs even after washing. Please DO NOT use if you have an allergy to CHG or antibacterial soaps.  If your skin becomes reddened/irritated stop using the CHG and inform your nurse when you arrive at Short Stay. Do not shave (including legs and underarms) for at least 48 hours prior to the first CHG shower.  You may shave your face/neck. Please follow these instructions carefully:  1.  Shower with CHG Soap the night before surgery and the  morning of Surgery.  2.  If you choose to wash your hair, wash your hair first as usual with your  normal  shampoo.  3.  After you shampoo, rinse your hair and body thoroughly to remove the  shampoo.                           4.  Use CHG as you would any other liquid soap.  You can apply chg directly  to the skin and wash                       Gently with a scrungie or clean washcloth.  5.  Apply the CHG Soap  to your body ONLY FROM THE NECK DOWN.   Do not use on face/ open                           Wound or open sores. Avoid contact with eyes, ears mouth and genitals (private parts).                       Wash face,  Genitals (private parts) with your normal soap.             6.  Wash thoroughly, paying special attention to the area where your surgery  will be performed.  7.  Thoroughly rinse your body with warm water from the neck down.  8.  DO NOT shower/wash with your normal soap after using and rinsing off  the CHG Soap.                9.  Pat yourself dry with a clean towel.            10.  Wear clean pajamas.            11.  Place clean sheets on your bed the night of your first shower and do not  sleep with pets. Day of Surgery : Do not apply any lotions/deodorants the morning of surgery.  Please wear clean clothes to the hospital/surgery center.  FAILURE TO FOLLOW THESE INSTRUCTIONS MAY RESULT IN THE CANCELLATION OF YOUR SURGERY PATIENT SIGNATURE_________________________________  NURSE SIGNATURE__________________________________  ________________________________________________________________________
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
## 2016-06-29 ENCOUNTER — Encounter: Payer: Self-pay | Admitting: Gynecologic Oncology

## 2016-06-29 ENCOUNTER — Ambulatory Visit (HOSPITAL_BASED_OUTPATIENT_CLINIC_OR_DEPARTMENT_OTHER): Payer: Managed Care, Other (non HMO) | Admitting: Gynecologic Oncology

## 2016-06-29 ENCOUNTER — Encounter (HOSPITAL_COMMUNITY): Payer: Self-pay

## 2016-06-29 ENCOUNTER — Encounter (HOSPITAL_COMMUNITY)
Admission: RE | Admit: 2016-06-29 | Discharge: 2016-06-29 | Disposition: A | Discharge status: Home / Self Care | Payer: Managed Care, Other (non HMO) | Source: Ambulatory Visit | Attending: Surgery | Admitting: Surgery

## 2016-06-29 VITALS — BP 135/86 | HR 71 | Temp 98.4°F | Resp 17 | Wt 188.2 lb

## 2016-06-29 DIAGNOSIS — Z87442 Personal history of urinary calculi: Secondary | ICD-10-CM

## 2016-06-29 DIAGNOSIS — M545 Low back pain: Secondary | ICD-10-CM | POA: Insufficient documentation

## 2016-06-29 DIAGNOSIS — Z01812 Encounter for preprocedural laboratory examination: Secondary | ICD-10-CM

## 2016-06-29 DIAGNOSIS — N83292 Other ovarian cyst, left side: Secondary | ICD-10-CM

## 2016-06-29 DIAGNOSIS — N83202 Unspecified ovarian cyst, left side: Secondary | ICD-10-CM

## 2016-06-29 DIAGNOSIS — Z8041 Family history of malignant neoplasm of ovary: Secondary | ICD-10-CM

## 2016-06-29 DIAGNOSIS — G8929 Other chronic pain: Secondary | ICD-10-CM

## 2016-06-29 DIAGNOSIS — K621 Rectal polyp: Secondary | ICD-10-CM

## 2016-06-29 LAB — CBC
HEMATOCRIT: 37.1 % (ref 36.0–46.0)
Hemoglobin: 11.9 g/dL — ABNORMAL LOW (ref 12.0–15.0)
MCH: 28.8 pg (ref 26.0–34.0)
MCHC: 32.1 g/dL (ref 30.0–36.0)
MCV: 89.8 fL (ref 78.0–100.0)
PLATELETS: 324 10*3/uL (ref 150–400)
RBC: 4.13 MIL/uL (ref 3.87–5.11)
RDW: 14.6 % (ref 11.5–15.5)
WBC: 8.8 10*3/uL (ref 4.0–10.5)

## 2016-06-29 LAB — COMPREHENSIVE METABOLIC PANEL
ALT: 15 U/L (ref 14–54)
ANION GAP: 8 (ref 5–15)
AST: 19 U/L (ref 15–41)
Albumin: 4.2 g/dL (ref 3.5–5.0)
Alkaline Phosphatase: 54 U/L (ref 38–126)
BILIRUBIN TOTAL: 0.6 mg/dL (ref 0.3–1.2)
BUN: 28 mg/dL — AB (ref 6–20)
CALCIUM: 9.6 mg/dL (ref 8.9–10.3)
CO2: 22 mmol/L (ref 22–32)
Chloride: 112 mmol/L — ABNORMAL HIGH (ref 101–111)
Creatinine, Ser: 0.69 mg/dL (ref 0.44–1.00)
GFR calc Af Amer: >60 mL/min (ref >60)
Glucose, Bld: 96 mg/dL (ref 65–99)
POTASSIUM: 4.1 mmol/L (ref 3.5–5.1)
Sodium: 142 mmol/L (ref 135–145)
TOTAL PROTEIN: 7.5 g/dL (ref 6.5–8.1)

## 2016-06-29 LAB — ABO/RH: ABO/RH(D): A POS

## 2016-06-29 NOTE — Patient Instructions (Signed)
Please call for any questions or concerns. 

## 2016-06-29 NOTE — Progress Notes (Addendum)
Called and spoke with Abigail Butts ( Triage Nurse) at [facility] and made her aware that Metronidazole - patient has intolerance to and causes nausea.  Abigail Butts stated she would let Dr Johney Maine be aware of above.   Patient and daughter made aware at preop appointment that nurse called office of [facility] and spoke with Abigail Butts at office and  made aware of Metronidazole- intolerance and nausea reaction and that Abigail Butts was going to let Dr Johney Maine be aware.

## 2016-06-29 NOTE — Progress Notes (Signed)
Chest CT- 06/02/2016- epic

## 2016-06-29 NOTE — Consult Note (Signed)
[facility] Nurse requested for preoperative stoma site marking  Discussed surgical procedure and stoma creation with patient and family.  Explained role of the [facility] nurse team.  Provided the patient with educational booklet/DVD and provided samples of pouching options.  Answered patient and family questions with assistance of Lenard Simmer, RN [place] student.   Examined patient lying, sitting, and standing in order to place the marking in the patient's visual field, away from any creases or abdominal contour issues and within the rectus muscle.  Attempted to mark below the patient's belt line.  Patient presents with midline abdominal and RLQ scarring from prior surgery.  We were able to avoid these areas in marking.  Patient with significant weight loss, pendulous abdomen.    Marked for ileostomy in the RLQ  3 cm to the right of the umbilicus and  2 cm below the umbilicus.   Patient's abdomen cleansed with CHG wipes at site markings, allowed to air dry prior to marking.Covered mark with thin film transparent dressing to preserve mark until date of surgery.   [facility] Nurse team will follow up with patient after surgery for continue ostomy care and teaching.   Domenic Moras RN BSN [place] Pager [phone]

## 2016-06-29 NOTE — Progress Notes (Signed)
CMP done 06/29/16 faxed via EPIC to Dr Johney Maine and Dr Risa Grill.

## 2016-06-30 LAB — HEMOGLOBIN A1C
HEMOGLOBIN A1C: 5.2 % (ref 4.8–5.6)
Mean Plasma Glucose: 103 mg/dL

## 2016-06-30 MED ORDER — SODIUM CHLORIDE 0.9 % IV SOLN
INTRAVENOUS | Status: DC
  Filled 2016-06-30: qty 6

## 2016-06-30 NOTE — Anesthesia Preprocedure Evaluation (Addendum)
Anesthesia Evaluation  Patient identified by MRN, date of birth, ID band Patient awake    Reviewed: Allergy & Precautions, NPO status , Patient's Chart, lab work & pertinent test results  History of Anesthesia Complications Negative for: history of anesthetic complications  Airway Mallampati: II  TM Distance: >3 FB Neck ROM: Full    Dental no notable dental hx. (+) Dental Advisory Given   Pulmonary neg pulmonary ROS,    Pulmonary exam normal        Cardiovascular negative cardio ROS Normal cardiovascular exam     Neuro/Psych negative neurological ROS  negative psych ROS   GI/Hepatic Neg liver ROS, GERD  ,  Endo/Other  negative endocrine ROS  Renal/GU Renal disease  negative genitourinary   Musculoskeletal negative musculoskeletal ROS (+)   Abdominal   Peds negative pediatric ROS (+)  Hematology negative hematology ROS (+)   Anesthesia Other Findings   Reproductive/Obstetrics negative OB ROS                            Anesthesia Physical  Anesthesia Plan  ASA: II  Anesthesia Plan: General   Post-op Pain Management:    Induction: Intravenous  Airway Management Planned: Oral ETT  Additional Equipment:   Intra-op Plan:   Post-operative Plan: Extubation in OR  Informed Consent: I have reviewed the patients History and Physical, chart, labs and discussed the procedure including the risks, benefits and alternatives for the proposed anesthesia with the patient or authorized representative who has indicated his/her understanding and acceptance.   Dental advisory given  Plan Discussed with: CRNA and Anesthesiologist  Anesthesia Plan Comments:        Anesthesia Quick Evaluation

## 2016-07-01 ENCOUNTER — Inpatient Hospital Stay (HOSPITAL_COMMUNITY): Payer: Managed Care, Other (non HMO) | Admitting: Anesthesiology

## 2016-07-01 ENCOUNTER — Inpatient Hospital Stay (HOSPITAL_COMMUNITY): Payer: Managed Care, Other (non HMO)

## 2016-07-01 ENCOUNTER — Encounter (HOSPITAL_COMMUNITY)
Admission: RE | Disposition: A | Discharge status: Home Health | Payer: Self-pay | Source: Ambulatory Visit | Attending: Surgery

## 2016-07-01 ENCOUNTER — Encounter (HOSPITAL_COMMUNITY): Payer: Self-pay | Admitting: Certified Registered Nurse Anesthetist

## 2016-07-01 ENCOUNTER — Inpatient Hospital Stay (HOSPITAL_COMMUNITY)
Admission: RE | Admit: 2016-07-01 | Discharge: 2016-07-15 | DRG: 329 | Disposition: A | Discharge status: Home Health | Payer: Managed Care, Other (non HMO) | Source: Ambulatory Visit | Attending: Surgery | Admitting: Surgery

## 2016-07-01 DIAGNOSIS — Z8041 Family history of malignant neoplasm of ovary: Secondary | ICD-10-CM

## 2016-07-01 DIAGNOSIS — Z79899 Other long term (current) drug therapy: Secondary | ICD-10-CM | POA: Diagnosis not present

## 2016-07-01 DIAGNOSIS — E669 Obesity, unspecified: Secondary | ICD-10-CM | POA: Insufficient documentation

## 2016-07-01 DIAGNOSIS — M549 Dorsalgia, unspecified: Secondary | ICD-10-CM | POA: Diagnosis present

## 2016-07-01 DIAGNOSIS — E876 Hypokalemia: Secondary | ICD-10-CM | POA: Diagnosis present

## 2016-07-01 DIAGNOSIS — Z800 Family history of malignant neoplasm of digestive organs: Secondary | ICD-10-CM

## 2016-07-01 DIAGNOSIS — K660 Peritoneal adhesions (postprocedural) (postinfection): Secondary | ICD-10-CM | POA: Diagnosis present

## 2016-07-01 DIAGNOSIS — Z91041 Radiographic dye allergy status: Secondary | ICD-10-CM

## 2016-07-01 DIAGNOSIS — K631 Perforation of intestine (nontraumatic): Secondary | ICD-10-CM | POA: Diagnosis not present

## 2016-07-01 DIAGNOSIS — Z6835 Body mass index (BMI) 35.0-35.9, adult: Secondary | ICD-10-CM

## 2016-07-01 DIAGNOSIS — K9189 Other postprocedural complications and disorders of digestive system: Secondary | ICD-10-CM | POA: Diagnosis present

## 2016-07-01 DIAGNOSIS — N83292 Other ovarian cyst, left side: Secondary | ICD-10-CM | POA: Diagnosis present

## 2016-07-01 DIAGNOSIS — Z803 Family history of malignant neoplasm of breast: Secondary | ICD-10-CM | POA: Diagnosis not present

## 2016-07-01 DIAGNOSIS — N83209 Unspecified ovarian cyst, unspecified side: Secondary | ICD-10-CM | POA: Diagnosis present

## 2016-07-01 DIAGNOSIS — Z8371 Family history of colonic polyps: Secondary | ICD-10-CM | POA: Diagnosis not present

## 2016-07-01 DIAGNOSIS — Y838 Other surgical procedures as the cause of abnormal reaction of the patient, or of later complication, without mention of misadventure at the time of the procedure: Secondary | ICD-10-CM | POA: Diagnosis present

## 2016-07-01 DIAGNOSIS — E43 Unspecified severe protein-calorie malnutrition: Secondary | ICD-10-CM | POA: Insufficient documentation

## 2016-07-01 DIAGNOSIS — K567 Ileus, unspecified: Secondary | ICD-10-CM

## 2016-07-01 DIAGNOSIS — D259 Leiomyoma of uterus, unspecified: Secondary | ICD-10-CM | POA: Diagnosis present

## 2016-07-01 DIAGNOSIS — D62 Acute posthemorrhagic anemia: Secondary | ICD-10-CM | POA: Diagnosis not present

## 2016-07-01 DIAGNOSIS — Z8049 Family history of malignant neoplasm of other genital organs: Secondary | ICD-10-CM

## 2016-07-01 DIAGNOSIS — G8929 Other chronic pain: Secondary | ICD-10-CM | POA: Diagnosis present

## 2016-07-01 DIAGNOSIS — Z888 Allergy status to other drugs, medicaments and biological substances status: Secondary | ICD-10-CM | POA: Diagnosis not present

## 2016-07-01 DIAGNOSIS — Z932 Ileostomy status: Secondary | ICD-10-CM

## 2016-07-01 DIAGNOSIS — C20 Malignant neoplasm of rectum: Secondary | ICD-10-CM

## 2016-07-01 HISTORY — PX: XI ROBOTIC ASSISTED LOWER ANTERIOR RESECTION: SHX6558

## 2016-07-01 HISTORY — DX: Other specified postprocedural states: Z98.890

## 2016-07-01 HISTORY — DX: Ileostomy status: Z93.2

## 2016-07-01 HISTORY — PX: LAPAROSCOPIC LYSIS OF ADHESIONS: SHX5905

## 2016-07-01 HISTORY — PX: DIVERTING ILEOSTOMY: SHX5799

## 2016-07-01 HISTORY — DX: Malignant neoplasm of rectum: C20

## 2016-07-01 HISTORY — PX: LAPAROSCOPIC BILATERAL SALPINGO OOPHERECTOMY: SHX5890

## 2016-07-01 HISTORY — PX: CYSTOSCOPY WITH STENT PLACEMENT: SHX5790

## 2016-07-01 HISTORY — PX: OOPHORECTOMY: SHX6387

## 2016-07-01 LAB — TYPE AND SCREEN
ABO/RH(D): A POS
ANTIBODY SCREEN: NEGATIVE

## 2016-07-01 SURGERY — RESECTION, RECTUM, LOW ANTERIOR, ROBOT-ASSISTED
Anesthesia: General | Site: Abdomen

## 2016-07-01 MED ORDER — SCOPOLAMINE 1 MG/3DAYS TD PT72
MEDICATED_PATCH | TRANSDERMAL | Status: AC
  Filled 2016-07-01: qty 1

## 2016-07-01 MED ORDER — SUGAMMADEX SODIUM 200 MG/2ML IV SOLN
INTRAVENOUS | Status: AC
  Filled 2016-07-01: qty 2

## 2016-07-01 MED ORDER — CELECOXIB 200 MG PO CAPS
400.0000 mg | ORAL_CAPSULE | ORAL | Status: AC
  Administered 2016-07-01: 400 mg via ORAL
  Filled 2016-07-01: qty 2

## 2016-07-01 MED ORDER — DEXAMETHASONE SODIUM PHOSPHATE 10 MG/ML IJ SOLN
INTRAMUSCULAR | Status: AC
  Filled 2016-07-01: qty 1

## 2016-07-01 MED ORDER — SODIUM CHLORIDE 0.9 % IR SOLN
Status: DC | PRN
  Administered 2016-07-01: 2000 mL

## 2016-07-01 MED ORDER — LACTATED RINGERS IV BOLUS (SEPSIS): 1000.0000 mL | Freq: Three times a day (TID) | INTRAVENOUS | Status: DC | PRN

## 2016-07-01 MED ORDER — MINERAL OIL LIGHT 100 % EX OIL
TOPICAL_OIL | CUTANEOUS | Status: AC
  Filled 2016-07-01: qty 25

## 2016-07-01 MED ORDER — SCOPOLAMINE 1 MG/3DAYS TD PT72
MEDICATED_PATCH | TRANSDERMAL | Status: DC | PRN
  Administered 2016-07-01: 1 via TRANSDERMAL

## 2016-07-01 MED ORDER — DIPHENHYDRAMINE HCL 50 MG/ML IJ SOLN
12.5000 mg | Freq: Four times a day (QID) | INTRAMUSCULAR | Status: DC | PRN
  Administered 2016-07-03: 12.5 mg via INTRAVENOUS
  Filled 2016-07-01: qty 1

## 2016-07-01 MED ORDER — ENOXAPARIN SODIUM 40 MG/0.4ML ~~LOC~~ SOLN
40.0000 mg | SUBCUTANEOUS | Status: DC
  Administered 2016-07-02: 40 mg via SUBCUTANEOUS
  Filled 2016-07-01: qty 0.4

## 2016-07-01 MED ORDER — DIPHENHYDRAMINE HCL 12.5 MG/5ML PO ELIX: 12.5000 mg | ORAL_SOLUTION | Freq: Four times a day (QID) | ORAL | Status: DC | PRN

## 2016-07-01 MED ORDER — ADULT MULTIVITAMIN W/MINERALS CH
1.0000 | ORAL_TABLET | Freq: Every day | ORAL | Status: DC
  Administered 2016-07-02 – 2016-07-09 (×4): 1 via ORAL
  Filled 2016-07-01 (×7): qty 1

## 2016-07-01 MED ORDER — ALBUMIN HUMAN 5 % IV SOLN
INTRAVENOUS | Status: DC | PRN
  Administered 2016-07-01 (×2): via INTRAVENOUS

## 2016-07-01 MED ORDER — ROCURONIUM BROMIDE 100 MG/10ML IV SOLN
INTRAVENOUS | Status: DC | PRN
  Administered 2016-07-01 (×4): 10 mg via INTRAVENOUS
  Administered 2016-07-01: 50 mg via INTRAVENOUS
  Administered 2016-07-01: 30 mg via INTRAVENOUS
  Administered 2016-07-01: 10 mg via INTRAVENOUS

## 2016-07-01 MED ORDER — MENTHOL 3 MG MT LOZG: 1.0000 | LOZENGE | OROMUCOSAL | Status: DC | PRN

## 2016-07-01 MED ORDER — ESMOLOL HCL 100 MG/10ML IV SOLN
INTRAVENOUS | Status: DC | PRN
  Administered 2016-07-01: 10 mg via INTRAVENOUS

## 2016-07-01 MED ORDER — LACTATED RINGERS IV SOLN
INTRAVENOUS | Status: DC | PRN
  Administered 2016-07-01: 08:00:00 via INTRAVENOUS

## 2016-07-01 MED ORDER — DEXAMETHASONE SODIUM PHOSPHATE 10 MG/ML IJ SOLN
INTRAMUSCULAR | Status: DC | PRN
  Administered 2016-07-01: 10 mg via INTRAVENOUS

## 2016-07-01 MED ORDER — BUPIVACAINE HCL (PF) 0.25 % IJ SOLN
INTRAMUSCULAR | Status: DC | PRN
  Administered 2016-07-01: 20 mL

## 2016-07-01 MED ORDER — MIDAZOLAM HCL 2 MG/2ML IJ SOLN
INTRAMUSCULAR | Status: AC
Start: 2016-07-01 — End: 2016-07-01
  Filled 2016-07-01: qty 2

## 2016-07-01 MED ORDER — MIDAZOLAM HCL 5 MG/5ML IJ SOLN
INTRAMUSCULAR | Status: DC | PRN
  Administered 2016-07-01 (×2): 1 mg via INTRAVENOUS

## 2016-07-01 MED ORDER — LACTATED RINGERS IV SOLN
INTRAVENOUS | Status: DC
  Administered 2016-07-02: 05:00:00 via INTRAVENOUS

## 2016-07-01 MED ORDER — BUPIVACAINE LIPOSOME 1.3 % IJ SUSP
20.0000 mL | INTRAMUSCULAR | Status: DC
  Filled 2016-07-01 (×2): qty 20

## 2016-07-01 MED ORDER — LACTATED RINGERS IR SOLN
Status: DC | PRN
  Administered 2016-07-01: 1000 mL

## 2016-07-01 MED ORDER — HYDROMORPHONE HCL 1 MG/ML IJ SOLN
INTRAMUSCULAR | Status: AC
  Administered 2016-07-01: 0.5 mg via INTRAVENOUS
  Filled 2016-07-01: qty 1

## 2016-07-01 MED ORDER — ALBUMIN HUMAN 5 % IV SOLN
INTRAVENOUS | Status: AC
  Filled 2016-07-01: qty 250

## 2016-07-01 MED ORDER — BUPIVACAINE LIPOSOME 1.3 % IJ SUSP: 20.0000 mL | Freq: Once | INTRAMUSCULAR | Status: DC

## 2016-07-01 MED ORDER — HYDROMORPHONE HCL 1 MG/ML IJ SOLN
0.2500 mg | INTRAMUSCULAR | Status: DC | PRN
  Administered 2016-07-01 (×2): 0.5 mg via INTRAVENOUS

## 2016-07-01 MED ORDER — ACETAMINOPHEN 500 MG PO TABS
1000.0000 mg | ORAL_TABLET | Freq: Three times a day (TID) | ORAL | Status: DC
  Administered 2016-07-01 – 2016-07-02 (×2): 1000 mg via ORAL
  Filled 2016-07-01 (×2): qty 2

## 2016-07-01 MED ORDER — ONDANSETRON HCL 4 MG/2ML IJ SOLN
INTRAMUSCULAR | Status: AC
  Filled 2016-07-01: qty 2

## 2016-07-01 MED ORDER — NEOMYCIN SULFATE 500 MG PO TABS
1000.0000 mg | ORAL_TABLET | ORAL | Status: DC
  Filled 2016-07-01: qty 2

## 2016-07-01 MED ORDER — LIP MEDEX EX OINT: 1.0000 "application " | TOPICAL_OINTMENT | Freq: Two times a day (BID) | CUTANEOUS | Status: DC

## 2016-07-01 MED ORDER — BUPIVACAINE HCL (PF) 0.25 % IJ SOLN
INTRAMUSCULAR | Status: AC
  Filled 2016-07-01: qty 60

## 2016-07-01 MED ORDER — ALVIMOPAN 12 MG PO CAPS
12.0000 mg | ORAL_CAPSULE | Freq: Once | ORAL | Status: AC
  Administered 2016-07-01: 12 mg via ORAL
  Filled 2016-07-01: qty 1

## 2016-07-01 MED ORDER — BISACODYL 5 MG PO TBEC
20.0000 mg | DELAYED_RELEASE_TABLET | Freq: Once | ORAL | Status: DC
  Filled 2016-07-01: qty 4

## 2016-07-01 MED ORDER — FENTANYL CITRATE (PF) 100 MCG/2ML IJ SOLN
INTRAMUSCULAR | Status: AC
  Filled 2016-07-01: qty 2

## 2016-07-01 MED ORDER — ROCURONIUM BROMIDE 10 MG/ML (PF) SYRINGE
PREFILLED_SYRINGE | INTRAVENOUS | Status: AC
  Filled 2016-07-01: qty 10

## 2016-07-01 MED ORDER — LIDOCAINE 2% (20 MG/ML) 5 ML SYRINGE
INTRAMUSCULAR | Status: AC
  Filled 2016-07-01: qty 5

## 2016-07-01 MED ORDER — STERILE WATER FOR IRRIGATION IR SOLN
Status: DC | PRN
  Administered 2016-07-01: 3000 mL

## 2016-07-01 MED ORDER — OXYCODONE HCL 5 MG PO TABS: 5.0000 mg | ORAL_TABLET | ORAL | 0 refills | Status: DC | PRN

## 2016-07-01 MED ORDER — CHLORHEXIDINE GLUCONATE 4 % EX LIQD: 60.0000 mL | Freq: Once | CUTANEOUS | Status: DC

## 2016-07-01 MED ORDER — PROPOFOL 10 MG/ML IV BOLUS
INTRAVENOUS | Status: DC | PRN
  Administered 2016-07-01: 50 mg via INTRAVENOUS
  Administered 2016-07-01: 150 mg via INTRAVENOUS

## 2016-07-01 MED ORDER — SACCHAROMYCES BOULARDII 250 MG PO CAPS
250.0000 mg | ORAL_CAPSULE | Freq: Two times a day (BID) | ORAL | Status: DC
  Administered 2016-07-01 – 2016-07-02 (×2): 250 mg via ORAL
  Filled 2016-07-01 (×2): qty 1

## 2016-07-01 MED ORDER — FENTANYL CITRATE (PF) 250 MCG/5ML IJ SOLN
INTRAMUSCULAR | Status: AC
  Filled 2016-07-01: qty 5

## 2016-07-01 MED ORDER — LIP MEDEX EX OINT
1.0000 "application " | TOPICAL_OINTMENT | Freq: Two times a day (BID) | CUTANEOUS | Status: DC
  Administered 2016-07-01 – 2016-07-15 (×18): 1 via TOPICAL
  Filled 2016-07-01 (×2): qty 7

## 2016-07-01 MED ORDER — METOPROLOL TARTRATE 5 MG/5ML IV SOLN
5.0000 mg | Freq: Four times a day (QID) | INTRAVENOUS | Status: DC | PRN
  Filled 2016-07-01: qty 5

## 2016-07-01 MED ORDER — GABAPENTIN 300 MG PO CAPS
300.0000 mg | ORAL_CAPSULE | ORAL | Status: AC
  Administered 2016-07-01: 300 mg via ORAL
  Filled 2016-07-01: qty 1

## 2016-07-01 MED ORDER — HYDROMORPHONE HCL 1 MG/ML IJ SOLN
0.5000 mg | INTRAMUSCULAR | Status: DC | PRN
  Administered 2016-07-01 – 2016-07-02 (×6): 1 mg via INTRAVENOUS
  Filled 2016-07-01 (×7): qty 1

## 2016-07-01 MED ORDER — PROPOFOL 10 MG/ML IV BOLUS
INTRAVENOUS | Status: AC
  Filled 2016-07-01: qty 20

## 2016-07-01 MED ORDER — ONDANSETRON HCL 4 MG/2ML IJ SOLN
INTRAMUSCULAR | Status: DC | PRN
  Administered 2016-07-01 (×2): 2 mg via INTRAVENOUS
  Administered 2016-07-01: 4 mg via INTRAVENOUS

## 2016-07-01 MED ORDER — SODIUM CHLORIDE 0.9 % IV SOLN
INTRAVENOUS | Status: DC | PRN
  Administered 2016-07-01: 10:00:00 via INTRAPERITONEAL

## 2016-07-01 MED ORDER — FENTANYL CITRATE (PF) 100 MCG/2ML IJ SOLN
INTRAMUSCULAR | Status: DC | PRN
  Administered 2016-07-01 (×7): 50 ug via INTRAVENOUS

## 2016-07-01 MED ORDER — METRONIDAZOLE 500 MG PO TABS
1000.0000 mg | ORAL_TABLET | ORAL | Status: DC
  Filled 2016-07-01: qty 2

## 2016-07-01 MED ORDER — BUPIVACAINE LIPOSOME 1.3 % IJ SUSP
INTRAMUSCULAR | Status: DC | PRN
Start: 2016-07-01 — End: 2016-07-01
  Administered 2016-07-01: 20 mL

## 2016-07-01 MED ORDER — ADULT MULTIVITAMIN W/MINERALS CH: 1.0000 | ORAL_TABLET | Freq: Every day | ORAL | Status: DC

## 2016-07-01 MED ORDER — ACETAMINOPHEN 500 MG PO TABS
1000.0000 mg | ORAL_TABLET | ORAL | Status: AC
  Administered 2016-07-01: 1000 mg via ORAL
  Filled 2016-07-01: qty 2

## 2016-07-01 MED ORDER — ALVIMOPAN 12 MG PO CAPS
12.0000 mg | ORAL_CAPSULE | Freq: Two times a day (BID) | ORAL | Status: DC
  Administered 2016-07-02: 12 mg via ORAL
  Filled 2016-07-01: qty 1

## 2016-07-01 MED ORDER — PHENOL 1.4 % MT LIQD
2.0000 | OROMUCOSAL | Status: DC | PRN
  Administered 2016-07-02 – 2016-07-04 (×2): 2 via OROMUCOSAL
  Filled 2016-07-01 (×3): qty 177

## 2016-07-01 MED ORDER — ENOXAPARIN SODIUM 40 MG/0.4ML ~~LOC~~ SOLN: 40.0000 mg | Freq: Once | SUBCUTANEOUS | Status: DC

## 2016-07-01 MED ORDER — ENOXAPARIN SODIUM 40 MG/0.4ML ~~LOC~~ SOLN
40.0000 mg | Freq: Once | SUBCUTANEOUS | Status: AC
  Administered 2016-07-01: 40 mg via SUBCUTANEOUS
  Filled 2016-07-01 (×2): qty 0.4

## 2016-07-01 MED ORDER — DEXTROSE 5 % IV SOLN
2.0000 g | INTRAVENOUS | Status: AC
  Administered 2016-07-01: 2 g via INTRAVENOUS
  Filled 2016-07-01: qty 2

## 2016-07-01 MED ORDER — SODIUM CHLORIDE 0.9 % IJ SOLN
INTRAMUSCULAR | Status: AC
  Filled 2016-07-01: qty 50

## 2016-07-01 MED ORDER — LACTATED RINGERS IV SOLN
INTRAVENOUS | Status: DC | PRN
  Administered 2016-07-01 (×3): via INTRAVENOUS

## 2016-07-01 MED ORDER — SUGAMMADEX SODIUM 500 MG/5ML IV SOLN
INTRAVENOUS | Status: DC | PRN
  Administered 2016-07-01: 200 mg via INTRAVENOUS

## 2016-07-01 MED ORDER — ALUM & MAG HYDROXIDE-SIMETH 200-200-20 MG/5ML PO SUSP
30.0000 mL | Freq: Four times a day (QID) | ORAL | Status: DC | PRN
  Administered 2016-07-08 – 2016-07-14 (×2): 30 mL via ORAL
  Filled 2016-07-01: qty 30

## 2016-07-01 MED ORDER — CEFOTETAN DISODIUM-DEXTROSE 2-2.08 GM-% IV SOLR
INTRAVENOUS | Status: AC
  Filled 2016-07-01: qty 50

## 2016-07-01 MED ORDER — PROMETHAZINE HCL 25 MG/ML IJ SOLN: 6.2500 mg | INTRAMUSCULAR | Status: DC | PRN

## 2016-07-01 MED ORDER — KETAMINE HCL 10 MG/ML IJ SOLN
INTRAMUSCULAR | Status: DC | PRN
  Administered 2016-07-01 (×7): 10 mg via INTRAVENOUS

## 2016-07-01 MED ORDER — LIDOCAINE HCL (CARDIAC) 20 MG/ML IV SOLN
INTRAVENOUS | Status: DC | PRN
  Administered 2016-07-01: 80 mg via INTRAVENOUS

## 2016-07-01 MED ORDER — DEXTROSE 5 % IV SOLN
2.0000 g | Freq: Two times a day (BID) | INTRAVENOUS | Status: AC
  Administered 2016-07-01: 2 g via INTRAVENOUS
  Filled 2016-07-01: qty 2

## 2016-07-01 MED ORDER — PROCHLORPERAZINE EDISYLATE 5 MG/ML IJ SOLN
10.0000 mg | Freq: Four times a day (QID) | INTRAMUSCULAR | Status: DC | PRN
  Administered 2016-07-01 – 2016-07-10 (×6): 10 mg via INTRAVENOUS
  Filled 2016-07-01 (×8): qty 2

## 2016-07-01 MED ORDER — KETAMINE HCL 10 MG/ML IJ SOLN
INTRAMUSCULAR | Status: AC
  Filled 2016-07-01: qty 1

## 2016-07-01 SURGICAL SUPPLY — 137 items
ADAPTER GOLDBERG URETERAL (ADAPTER) ×1 IMPLANT
ADPR CATH 15X14FR FL DRN BG (ADAPTER) ×4
ADPR CATH 3-6FR M LL SDARM Y (ADAPTER) ×4
APPLIER CLIP 5 13 M/L LIGAMAX5 (MISCELLANEOUS)
APPLIER CLIP ROT 10 11.4 M/L (STAPLE)
APR CLP MED LRG 11.4X10 (STAPLE)
APR CLP MED LRG 5 ANG JAW (MISCELLANEOUS)
BAG SPEC RTRVL LRG 6X4 10 (ENDOMECHANICALS) ×4
BAG URINE DRAINAGE (UROLOGICAL SUPPLIES) ×2 IMPLANT
BAG URO CATCHER STRL LF (MISCELLANEOUS) ×5 IMPLANT
BASKET ZERO TIP NITINOL 2.4FR (BASKET) IMPLANT
BLADE EXTENDED COATED 6.5IN (ELECTRODE) IMPLANT
BSKT STON RTRVL ZERO TP 2.4FR (BASKET)
CANNULA REDUC XI 12-8 STAPL (CANNULA) ×1
CANNULA REDUCER 12-8 DVNC XI (CANNULA) ×4 IMPLANT
CATH FOLEY 3WAY 30CC 26FR (CATHETERS) ×1 IMPLANT
CATH INTERMIT  6FR 70CM (CATHETERS) IMPLANT
CATH ROBINSON RED A/P 16FR (CATHETERS) ×1 IMPLANT
CATH URET 5FR 28IN CONE TIP (BALLOONS)
CATH URET 5FR 70CM CONE TIP (BALLOONS) IMPLANT
CELLS DAT CNTRL 66122 CELL SVR (MISCELLANEOUS) IMPLANT
CHLORAPREP W/TINT 26ML (MISCELLANEOUS) ×5 IMPLANT
CLIP APPLIE 5 13 M/L LIGAMAX5 (MISCELLANEOUS) IMPLANT
CLIP APPLIE ROT 10 11.4 M/L (STAPLE) IMPLANT
CLIP LIGATING HEM O LOK PURPLE (MISCELLANEOUS) IMPLANT
CLIP LIGATING HEMO O LOK GREEN (MISCELLANEOUS) IMPLANT
CLOTH BEACON ORANGE TIMEOUT ST (SAFETY) ×5 IMPLANT
COUNTER NEEDLE 20 DBL MAG RED (NEEDLE) ×5 IMPLANT
COVER MAYO STAND STRL (DRAPES) ×10 IMPLANT
COVER TIP SHEARS 8 DVNC (MISCELLANEOUS) ×4 IMPLANT
COVER TIP SHEARS 8MM DA VINCI (MISCELLANEOUS) ×1
DECANTER SPIKE VIAL GLASS SM (MISCELLANEOUS) ×6 IMPLANT
DEVICE TROCAR PUNCTURE CLOSURE (ENDOMECHANICALS) IMPLANT
DRAIN CHANNEL 19F RND (DRAIN) ×1 IMPLANT
DRAPE ARM DVNC X/XI (DISPOSABLE) ×16 IMPLANT
DRAPE COLUMN DVNC XI (DISPOSABLE) ×4 IMPLANT
DRAPE DA VINCI XI ARM (DISPOSABLE) ×4
DRAPE DA VINCI XI COLUMN (DISPOSABLE) ×1
DRAPE SURG IRRIG POUCH 19X23 (DRAPES) ×5 IMPLANT
DRSG OPSITE POSTOP 4X10 (GAUZE/BANDAGES/DRESSINGS) IMPLANT
DRSG OPSITE POSTOP 4X6 (GAUZE/BANDAGES/DRESSINGS) IMPLANT
DRSG OPSITE POSTOP 4X8 (GAUZE/BANDAGES/DRESSINGS) IMPLANT
DRSG TEGADERM 2-3/8X2-3/4 SM (GAUZE/BANDAGES/DRESSINGS) ×17 IMPLANT
DRSG TEGADERM 4X4.75 (GAUZE/BANDAGES/DRESSINGS) ×1 IMPLANT
ELECT PENCIL ROCKER SW 15FT (MISCELLANEOUS) ×10 IMPLANT
ELECT REM PT RETURN 15FT ADLT (MISCELLANEOUS) ×5 IMPLANT
ENDOLOOP SUT PDS II  0 18 (SUTURE)
ENDOLOOP SUT PDS II 0 18 (SUTURE) IMPLANT
EVACUATOR SILICONE 100CC (DRAIN) ×1 IMPLANT
GAUZE SPONGE 2X2 8PLY STRL LF (GAUZE/BANDAGES/DRESSINGS) ×4 IMPLANT
GAUZE SPONGE 4X4 12PLY STRL (GAUZE/BANDAGES/DRESSINGS) IMPLANT
GLOVE BIOGEL M STRL SZ7.5 (GLOVE) ×5 IMPLANT
GLOVE ECLIPSE 8.0 STRL XLNG CF (GLOVE) ×15 IMPLANT
GLOVE INDICATOR 8.0 STRL GRN (GLOVE) ×15 IMPLANT
GOWN STRL REUS W/TWL LRG LVL3 (GOWN DISPOSABLE) ×5 IMPLANT
GOWN STRL REUS W/TWL XL LVL3 (GOWN DISPOSABLE) ×33 IMPLANT
GRASPER ENDOPATH ANVIL 10MM (MISCELLANEOUS) IMPLANT
GUIDEWIRE STR DUAL SENSOR (WIRE) ×5 IMPLANT
HOLDER FOLEY CATH W/STRAP (MISCELLANEOUS) ×5 IMPLANT
IRRIG SUCT STRYKERFLOW 2 WTIP (MISCELLANEOUS) ×5
IRRIGATION SUCT STRKRFLW 2 WTP (MISCELLANEOUS) ×4 IMPLANT
KIT BASIN OR (CUSTOM PROCEDURE TRAY) ×2 IMPLANT
KIT PROCEDURE DA VINCI SI (MISCELLANEOUS)
KIT PROCEDURE DVNC SI (MISCELLANEOUS) ×4 IMPLANT
KIT URETTERAL LIGHTED STENTS (STENTS) ×1 IMPLANT
LEGGING LITHOTOMY PAIR STRL (DRAPES) ×5 IMPLANT
LUBRICANT JELLY K Y 4OZ (MISCELLANEOUS) ×1 IMPLANT
MANIFOLD NEPTUNE II (INSTRUMENTS) ×6 IMPLANT
NDL INSUFFLATION 14GA 120MM (NEEDLE) ×4 IMPLANT
NEEDLE INSUFFLATION 14GA 120MM (NEEDLE) ×5 IMPLANT
PACK CARDIOVASCULAR III (CUSTOM PROCEDURE TRAY) ×5 IMPLANT
PACK COLON (CUSTOM PROCEDURE TRAY) ×5 IMPLANT
PACK CYSTO (CUSTOM PROCEDURE TRAY) ×5 IMPLANT
PACK GENERAL/GYN (CUSTOM PROCEDURE TRAY) ×1 IMPLANT
PAD POSITIONING PINK XL (MISCELLANEOUS) ×5 IMPLANT
PLUG CATH AND CAP STER (CATHETERS) ×2 IMPLANT
PORT LAP GEL ALEXIS MED 5-9CM (MISCELLANEOUS) ×5 IMPLANT
POUCH DRAINABLE 1PC 2 1/4 FLAT (OSTOMY) ×1 IMPLANT
POUCH SPECIMEN RETRIEVAL 10MM (ENDOMECHANICALS) ×1 IMPLANT
RELOAD STAPLE 45 BLU REG DVNC (STAPLE) IMPLANT
RELOAD STAPLE 45 GRN THCK DVNC (STAPLE) IMPLANT
RETRACTOR LONE STAR DISPOSABLE (INSTRUMENTS) ×1 IMPLANT
RETRACTOR STAY HOOK 5MM (MISCELLANEOUS) ×1 IMPLANT
RETRACTOR WND ALEXIS 18 MED (MISCELLANEOUS) IMPLANT
RTRCTR WOUND ALEXIS 18CM MED (MISCELLANEOUS)
SCISSORS LAP 5X35 DISP (ENDOMECHANICALS) ×5 IMPLANT
SCISSORS LAP 5X45 EPIX DISP (ENDOMECHANICALS) ×1 IMPLANT
SEAL CANN UNIV 5-8 DVNC XI (MISCELLANEOUS) ×12 IMPLANT
SEAL XI 5MM-8MM UNIVERSAL (MISCELLANEOUS) ×3
SEALER VESSEL DA VINCI XI (MISCELLANEOUS) ×1
SEALER VESSEL EXT DVNC XI (MISCELLANEOUS) ×4 IMPLANT
SET BI-LUMEN FLTR TB AIRSEAL (TUBING) ×6 IMPLANT
SIDE-ARM FITTING (ADAPTER) ×1 IMPLANT
SLEEVE ADV FIXATION 5X100MM (TROCAR) ×4 IMPLANT
SOLUTION ELECTROLUBE (MISCELLANEOUS) ×5 IMPLANT
SPONGE GAUZE 2X2 STER 10/PKG (GAUZE/BANDAGES/DRESSINGS) ×1
STAPLER 45 BLU RELOAD XI (STAPLE) IMPLANT
STAPLER 45 BLUE RELOAD XI (STAPLE)
STAPLER 45 GREEN RELOAD XI (STAPLE)
STAPLER 45 GRN RELOAD XI (STAPLE) IMPLANT
STAPLER CANNULA SEAL DVNC XI (STAPLE) ×4 IMPLANT
STAPLER CANNULA SEAL XI (STAPLE) ×1
STAPLER SHEATH (SHEATH) ×1
STAPLER SHEATH ENDOWRIST DVNC (SHEATH) ×4 IMPLANT
SUT ETHILON 3 0 PS 1 (SUTURE) ×1 IMPLANT
SUT MNCRL AB 4-0 PS2 18 (SUTURE) ×5 IMPLANT
SUT PDS AB 1 CTX 36 (SUTURE) ×10 IMPLANT
SUT PDS AB 1 TP1 96 (SUTURE) IMPLANT
SUT PDS AB 2-0 CT2 27 (SUTURE) IMPLANT
SUT PROLENE 0 CT 2 (SUTURE) ×5 IMPLANT
SUT PROLENE 2 0 SH DA (SUTURE) IMPLANT
SUT SILK 2 0 (SUTURE) ×5
SUT SILK 2 0 SH (SUTURE) ×1 IMPLANT
SUT SILK 2 0 SH CR/8 (SUTURE) ×5 IMPLANT
SUT SILK 2-0 18XBRD TIE 12 (SUTURE) ×4 IMPLANT
SUT SILK 3 0 (SUTURE)
SUT SILK 3 0 SH CR/8 (SUTURE) ×4 IMPLANT
SUT SILK 3-0 18XBRD TIE 12 (SUTURE) ×4 IMPLANT
SUT V-LOC BARB 180 2/0GR6 GS22 (SUTURE)
SUT VIC AB 2-0 SH 18 (SUTURE) ×4 IMPLANT
SUT VIC AB 2-0 UR6 27 (SUTURE) ×6 IMPLANT
SUT VIC AB 3-0 SH 18 (SUTURE) ×2 IMPLANT
SUT VIC AB 3-0 SH 27 (SUTURE)
SUT VIC AB 3-0 SH 27XBRD (SUTURE) IMPLANT
SUT VICRYL 0 UR6 27IN ABS (SUTURE) ×5 IMPLANT
SUTURE V-LC BRB 180 2/0GR6GS22 (SUTURE) IMPLANT
SYR 10ML LL (SYRINGE) ×5 IMPLANT
SYS LAPSCP GELPORT 120MM (MISCELLANEOUS)
SYSTEM LAPSCP GELPORT 120MM (MISCELLANEOUS) IMPLANT
TAPE UMBILICAL COTTON 1/8X30 (MISCELLANEOUS) ×5 IMPLANT
TOWEL OR 17X26 10 PK STRL BLUE (TOWEL DISPOSABLE) ×1 IMPLANT
TOWEL OR NON WOVEN STRL DISP B (DISPOSABLE) ×5 IMPLANT
TRAY FOLEY W/METER SILVER 16FR (SET/KITS/TRAYS/PACK) IMPLANT
TRAY IRRIG W/60CC SYR STRL (SET/KITS/TRAYS/PACK) ×1 IMPLANT
TROCAR ADV FIXATION 5X100MM (TROCAR) ×1 IMPLANT
TUBING CONNECTING 10 (TUBING) ×1 IMPLANT
WIRE COONS/BENSON .038X145CM (WIRE) ×4 IMPLANT

## 2016-07-01 NOTE — Anesthesia Postprocedure Evaluation (Signed)
Anesthesia Post Note  Patient: [name]  Procedure(s) Performed: Procedure(s) (LRB): XI ROBOTIC ASSISTED REDO LOWER ANTERIOR  RECTO-SIGMOID RESECTION WITH COLOANAL ANASTOMOSIS SPLENIC FLEXURE MOBILIZATION (N/A) BILATERAL SALPINGO-OOPHORECTOMY (Bilateral) CYSTOSCOPY WITH Bilateral STENT PLACEMENT (Bilateral) LAPAROSCOPIC LYSIS OF ADHESIONS DIVERTING ILEOSTOMY  Patient location during evaluation: PACU Anesthesia Type: General Level of consciousness: sedated Pain management: pain level controlled Vital Signs Assessment: post-procedure vital signs reviewed and stable Respiratory status: spontaneous breathing and respiratory function stable Cardiovascular status: stable Anesthetic complications: no    Last Vitals:  Vitals:   07/01/16 1747 07/01/16 1921  BP: 137/75 122/70  Pulse: 67 73  Resp: 16 14  Temp: 36.5 C 36.6 C    Last Pain:  Vitals:   07/01/16 1921  TempSrc: Axillary  PainSc:                  Jdyn Parkerson DANIEL

## 2016-07-01 NOTE — Transfer of Care (Signed)
Immediate Anesthesia Transfer of Care Note  Patient: ATHERINE DUDEK  Procedure(s) Performed: Procedure(s): XI ROBOTIC ASSISTED REDO LOWER ANTERIOR  RECTO-SIGMOID RESECTION WITH COLOANAL ANASTOMOSIS SPLENIC FLEXURE MOBILIZATION (N/A) BILATERAL SALPINGO-OOPHORECTOMY (Bilateral) CYSTOSCOPY WITH Bilateral STENT PLACEMENT (Bilateral) LAPAROSCOPIC LYSIS OF ADHESIONS DIVERTING ILEOSTOMY  Patient Location: PACU  Anesthesia Type:General  Level of Consciousness: sedated, patient cooperative and responds to stimulation  Airway & Oxygen Therapy: Patient Spontanous Breathing and Patient connected to face mask oxygen  Post-op Assessment: Report given to RN and Post -op Vital signs reviewed and stable  Post vital signs: Reviewed and stable  Last Vitals:  Vitals:   07/01/16 0649  BP: 135/87  Pulse: 75  Resp: 18  Temp: 36.6 C    Last Pain:  Vitals:   07/01/16 0709  TempSrc:   PainSc: 3       Patients Stated Pain Goal: 4 ([phone] [id number])  Complications: No apparent anesthesia complications

## 2016-07-01 NOTE — H&P (Deleted)
--------------------------------------------------------------------------------   Dorena Bodo Charlie Pitter  MRN: [id number]  PRIMARY CARE:  Jeannette How. Perini, MD  DOB: 07/15/1952, 60 year old Female  REFERRING:    SSN: *  PROVIDER:  Rana Snare, M.D.    TREATING:  Jimmey Ralph    LOCATION:  [facility] [phone]   --------------------------------------------------------------------------------   CC: I have pain in the bladder.  HPI: Ragnhild Forness is a 60 year-old female established patient who is here for bladder pain.  Has severe bladder pressure and difficulty voiding. Has seen no improvement of sxs with increasing Tamsulosin to BID. Yesterday states she "was so desperate she took 3 Flomax vs 2 and still no change." Has self treated also with OTC AZO with very minimal relief.    The patient states the nature of her problem(s) is pain. Her symptoms have been present for 4 months. She urinates every hour in the daytime. She has nocturia 5+ times per night. She does have urinary urgency. She denies daytime urinary leakage. She leaks urine with exercising.     ALLERGIES: No Allergies    MEDICATIONS: Aspirin 325 mg tablet 1 tablet PO Daily  Tamsulosin Hcl 0.4 mg capsule, ext release 24 hr 2 capsule PO Daily  Azo 1 PO Daily  BuPROPion HCl TABS Oral  Daily Multiple Vitamin 1 PO Daily  DiazePAM TABS Oral  Estradiol 0.075 MG/24HR Transdermal Patch Twice Weekly Transdermal  Hydrocodone-Acetaminophen TABS Oral  Zolpidem Tartrate TABS Oral     GU PSH: Hysterectomy Unilat SO - 2016      PSH Notes: Hand Surgery  Bladder Surgery  Foot Surgery   NON-GU PSH: None   GU PMH: Urinary Hesitancy, Hesitancy - 01/15/2015 Mixed incontinence, Urge and stress incontinence - 2016 Overactive bladder, Overactive bladder - 2016 Urinary Retention, Unspec, Incomplete bladder emptying - 2016    NON-GU PMH: Anxiety, Anxiety - 2016 Encounter for general adult medical examination without  abnormal findings, Encounter for preventive health examination - 2016 Personal history of other diseases of the musculoskeletal system and connective tissue, History of arthritis - 2016 Personal history of other mental and behavioral disorders, History of depression - 2016    FAMILY HISTORY: Anxiety - Runs In Family Hypertension - Runs In Family Kidney Stones - Runs In Family Polymyalgia - Runs In Family   SOCIAL HISTORY: Marital Status: Married Current Smoking Status: Patient has never smoked.  Has never drank.  Does not drink caffeine. Patient's occupation is/was Retired.    REVIEW OF SYSTEMS:    GU Review Female:   Patient reports frequent urination, hard to postpone urination, get up at night to urinate, stream starts and stops, trouble starting your stream, and have to strain to urinate. Patient denies burning /pain with urination, leakage of urine, and currently pregnant.  Gastrointestinal (Upper):   Patient denies nausea, vomiting, and indigestion/ heartburn.  Gastrointestinal (Lower):   Patient denies diarrhea and constipation.  Constitutional:   Patient denies fever, night sweats, weight loss, and fatigue.  Skin:   Patient denies skin rash/ lesion and itching.  Eyes:   Patient denies double vision and blurred vision.  Ears/ Nose/ Throat:   Patient denies sore throat and sinus problems.  Hematologic/Lymphatic:   Patient denies swollen glands and easy bruising.  Cardiovascular:   Patient denies leg swelling and chest pains.  Respiratory:   Patient denies cough and shortness of breath.  Endocrine:   Patient denies excessive thirst.  Musculoskeletal:   Patient reports joint pain. Patient denies back  pain.  Neurological:   Patient denies headaches and dizziness.  Psychologic:   Patient reports depression and anxiety.    VITAL SIGNS: None   MULTI-SYSTEM PHYSICAL EXAMINATION:    Constitutional: Well-nourished. No physical deformities. Normally developed. Good grooming.    Neurologic / Psychiatric: Patient anxious. Oriented to time, oriented to place, oriented to person. No depression, no agitation.   Gastrointestinal: No mass, no tenderness, no rigidity, non obese abdomen.      PAST DATA REVIEWED:  Source Of History:  Patient  Records Review:   Previous Patient Records  Urine Test Review:   Urinalysis   PROCEDURES:           PVR Ultrasound - [id number]  Scanned Volume: 0 cc         Anesthetic Cocktail - 51700 Patient cathed with a 42fr RR and bladder completely drained. 25cc marcaine was instilled into patients bladder without trouble. Patient was instructed not to void for 2 hours.            Urinalysis w/Scope - 81001 Dipstick Dipstick Cont'd Micro  Specimen: Voided Bilirubin: Invalid WBC/hpf: 0-5/hpf  Color: Orange Ketones: Invalid RBC/hpf: 0-2/hpf  Appearance: Cloudy Blood: Invalid Bacteria: NS (Not Seen)  Specific Gravity: Invalid Protein: Invalid Cystals: NS (Not Seen)  pH: Invalid Urobilinogen: Invalid Casts: NS (Not Seen)  Glucose: Invalid Nitrites: Invalid Trichomonas: Not Present    Leukocyte Esterase: Invalid Mucous: Not Present      Epithelial Cells: 0-5/hpf      Yeast: NS (Not Seen)      Sperm: Not Present         Ketoralac 30mg  - [id number], [id number] Qty: 30 Adm. By: Vicenta Dunning  Unit: mg Lot No [id number]  Route: IM Exp. Date 08/31/2017  Freq: None Mfgr.:   Site: Right Buttock   ASSESSMENT:      ICD-10 Details  1 GU:   Chronic bladder pain - R39.82 Worsening, Chronic - Ketorolac 30 mg IM today Ketorolac 10 mg 1 po Q6 hrs prn Marcaine 0.5% bladder instillation Instructed to RTC PRN bladder instillation. May need to resume DMOS treatments Also she may benefit from PT/OT referral if pain persists.    PLAN:            Medications New Meds: Ketorolac Tromethamine 10 mg tablet 1 tablet PO Q 6 H PRN   #20  0 Refill(s)            Orders Labs Urine Culture and Sensitivity          Schedule Return Visit: Return PRN  Procedure:  05/28/2016 at [facility] - Athalia 30mg  (Toradol Per 15 Mg) - [id number], [id number]  Procedure: 05/28/2016 at [facility] - [phone] - Anesthetic Cocktail (Bladder Lavage Irrigation) - 51700 Notes: Marcaine 0.5% 25 ml          Document Letter(s):  Created for Patient: Clinical Summary         Notes:   If she has good relief with bladder instillation may RTC PRN Marcaine instillations  If she continues with chronic bladder pain will refer to PT/OT  F/U PRN   Patient subsequently contacted the office requesting hydraulic overdistention of the bladder under anesthesia.  She is continued to have fairly severe ongoing chronic bladder pressure.  As a benefits of this approach were discussed with her.  She presents now for cystoscopy with hydraulic overdistention of the bladder.  She likely will have instillation of Marcaine.

## 2016-07-01 NOTE — H&P (View-Only) (Signed)
[name] 05/28/2016 3:52 PM Location: [facility] Patient #: [id number] DOB: 1956-01-27 Married / Language: English / Race: White Female   History of Present Illness Linda Hector MD; 05/28/2016 6:12 PM) The patient is a 60 year old female who presents with colorectal cancer. Note for "Colorectal cancer": Patient returns status post TEM resection of bulky posterior rectal wall mass at and mostly distal to the colorectal anastomosis.   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 Discuss with her at length earlier this week on the phone. She comes in today with her husband and daughter. They have many questions. Patient is appetite is fair. She is eating most solid foods but trying to use smaller bites. Has a fair amount of fecal urgency. Some chronic pain on her tailbone like she had a bad hemorrhoidectomy. No incontinence. Had worsening pain and difficulty with urination. Harder relaxing urinate. I started her on some antibiotics. She just started a dose or 2. She had been urinating okay last week. No major bleeding. No fevers or chills or sweats. Had a low-grade temperature around 100.4 at one point. Not now. She is tired but not toxic.  They many questions about diagnosis and treatment. Need for further interventions. Need for second opinion.     05/14/2016  PATIENT: [name] 60 y.o. female  Patient Care Team: [name], NP as PCP - General (Obstetrics and Gynecology) [name], MD as Consulting Physician (General Surgery) [name], MD as Consulting Physician (Gastroenterology) [name], MD as Consulting Physician (Gynecology)  PRE-OPERATIVE DIAGNOSIS: RECURRENT RECTAL POLYP  POST-OPERATIVE DIAGNOSIS: RECURRENT RECTAL POLYP at colorectal anastomosis  PROCEDURE: PARTIAL PROCTECTOMY BY TEM OF RECTAL MASS  SURGEON: Surgeon(s): [name], MD  ASSISTANT: [name], [name], [facility]  ANESTHESIA: local and general  EBL: Total I/O In: - Out: 100 [Urine:50; Blood:50]  Delay start of Pharmacological VTE agent (>24hrs) due to surgical blood loss or risk of bleeding: no  DRAINS: none  SPECIMEN: Source of Specimen: Polyp at colorectal junction  DISPOSITION OF SPECIMEN: PATHOLOGY  COUNTS: YES  PLAN OF CARE: Admit for overnight observation  PATIENT DISPOSITION: PACU - hemodynamically stable.  INDICATION: Patient status post low anterior resection for large rectal polyp in 2007. Required vaginal repair & loop ileostomy diversion. Had loop ileostomy takedown. Had hernia repair. Found to have recurrent polyp at colorectal anastomosis along posterior midline/left lateral. She refused another attempt at a low anterior resection. Because there is no definite proof of cancer, I offered exam under anesthesia with TEM resection.  OR FINDINGS: Bulky 6 x 5 cm polyp in the posterior midline rectum including 40% of the colorectal anastomosis. Most of the polyp on the rectal side.   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), GRADE 2 THE TUMOR INVADES MUSCULARIS PROPRIA (PT2) ALL MARGINS OF RESECTION ARE NEGATIVE FOR CARCINOMA ONE BENIGN LYMPH NODE (0/1) Microscopic Comment COLON AND RECTUM (INCLUDING TRANS-ANAL RESECTION): Specimen: Rectum Procedure: Resection Tumor site: Rectum Specimen integrity: Intact Macroscopic intactness of mesorectum: Not applicable: X Complete: NA Near complete: NA Incomplete: NA Cannot be determined (specify): NA Macroscopic tumor perforation: In Invasive tumor: Maximum size: 3.5 cm Histologic type(s): adenocarcinoma Histologic grade and  differentiation: G1: well differentiated/low grade G2: moderately differentiated/low  grade G3: poorly differentiated/high grade G4: undifferentiated/high grade Type of polyp in which invasive carcinoma arose: Tubular adenoma Microscopic extension of invasive tumor:Muscularis propria Lymph-Vascular invasion: Negative 1 of 5 Supplemental copy SUPPLEMENTAL for Grotz, Jahnyla G ([id number]) Microscopic Comment(continued) Peri-neural invasion: Negative Tumor deposit(s) (discontinuous extramural extension): Negative Resection margins: Proximal margin: Negative Distal margin: Negative Circumferential (radial) (posterior ascending, posterior descending; lateral and posterior mid-rectum; and entire lower 1/3 rectum):Negative Mesenteric margin (sigmoid and transverse): NA Distance closest margin (if all above margins negative): 0.4 cm Trans-anal resection margins only: Deep margin: Negative Mucosal Margin: Negative Distance closest mucosal margin (if negative): 0.4 cm Treatment effect (neo-adjuvant therapy): negative Additional polyp(s): Negative Non-neoplastic findings: unremarkable Lymph nodes: number examined 1; number positive: 0 Pathologic Staging: pT2, N0, Mx Ancillary studies: MSI ordered Casimer Lanius MD Pathologist, Electronic Signature (Case signed 05/19/2016) Specimen Gross and Clinical Information Specimen(s) Obtained: Rectum, resection, polyp Specimen Clinical Information recurrent rectal polyp [rd] Gross Received fresh is the product of a transanal resection of rectal polyp, which is oriented by the clinician as follows: blue pins at proximal, red pins at distal, yellow pins at left lateral, and white pins at right lateral. The specimen is 8.8 cm from proximal to distal, 7.4 cm from left lateral to right lateral, and the specimen is excised up to 1 cm deep, with areas of soft fatty tissue on the deep surface. The central specimen has a 6.5 x 6.5 cm area of tan red  nodular and granular mucosa, with a central fungating polypoid area, 3.5 x 3 cm and is up to 1.2 cm. On sectioning, there is underlying ill defined, but no definite invasion is identified at time of gross. Found within the lesion are multiple staples consistent with previous anastomosis. The specimen is inked as follows: proximal blue, distal red, left lateral yellow, right lateral green, deep margin black. Representative sections of this tissue are submitted as follows: A-G= full thickness central sections of lesion, to include deep margin only. H,I= radial sections of nearest superior margin. J,K= radial sections of nearest left lateral margin. L,M= radial sections of nearest distal margin. N,O= radial sections of right lateral margin. Also in the same container is a 10.9 cm in length strip of tan pink to hyperemic focally granular mucosa, ranging in width from 0.4 to 0.8 cm, and ranges in thickness from 0.4 to 0.7 cm. The specimen is oriented, clinically identifying as 2 of 5 Supplemental copy SUPPLEMENTAL for Ziolkowski, Deliyah G ([id number]) Gross(continued) additional left lateral margin, with blue pin at most proximal and red pin at most distal. The new left lateral margin is inked orange, and opposite (old corresponding margin) inked yellow, and representative sections of this strip of tissue are sequentially submitted from proximal to distal in blocks P-T. Total 20 blocks. (SW:gt, 05/15/16) Disclaimer Some of these immunohistochemical stains may have been developed and the performance characteristics determined by [facility]. Some may not have been cleared or approved by the U.S. Food and Drug Administration. The FDA has determined that such clearance or approval is not necessary. This test is used for clinical purposes. It should not be regarded as investigational or for research. This laboratory is certified under the [employer]  (CLIA-88) as qualified to perform high complexity clinical laboratory testing. Report signed out from the following location(s) Technical Component was performed at [facility]. [address],[place] [address].EXNT:[id number],QPR:[id number]., Interpretation was performed at [facility] [address], [place], [place] [address].  CLIA #: [id number], 3 of   Allergies Elbert Ewings, [place]; 05/28/2016 3:52 PM) Macrobid *URINARY ANTI-INFECTIVES*  Medication History Elbert Ewings, [place]; 05/28/2016 3:53 PM) Cipro (500MG Tablet, 1 (one) Tablet Oral two times daily, Taken starting 05/27/2016) Active. Flagyl (500MG Tablet, 1 (one) Tablet Oral two times daily, Taken starting 05/27/2016) Active. (Take at 2pm, 3pm, and 10pm the day prior to your colon operation) OxyCODONE HCl (5MG Tablet, 1-2 Tablet Oral every four hours, as needed for pain, Taken starting 05/27/2016) Active. Medications Reconciled Multiple Vitamins (Oral) Active.  Vitals Elbert Ewings [name]; 05/28/2016 3:53 PM) 05/28/2016 3:53 PM Weight: 197 lb Height: 63in Body Surface Area: 1.92 m Body Mass Index: 34.9 kg/m  Temp.: 97.70F(Temporal)  Pulse: 90 (Regular)  BP: 130/74 (Sitting, Left Arm, Standard)       Physical Exam Linda Hector MD; 05/28/2016 6:12 PM) General Mental Status-Alert. General Appearance-Not in acute distress. Voice-Normal.  Integumentary Global Assessment Upon inspection and palpation of skin surfaces of the - Distribution of scalp and body hair is normal. General Characteristics Overall examination of the patient's skin reveals - no rashes and no suspicious lesions.  Head and Neck Head-normocephalic, atraumatic with no lesions or palpable masses. Face Global Assessment - atraumatic, no absence of expression. Neck Global Assessment - no abnormal movements, no decreased range of motion. Trachea-midline. Thyroid Gland Characteristics -  non-tender.  Eye Eyeball - Left-Extraocular movements intact, No Nystagmus. Eyeball - Right-Extraocular movements intact, No Nystagmus. Upper Eyelid - Left-No Cyanotic. Upper Eyelid - Right-No Cyanotic.  Chest and Lung Exam Inspection Accessory muscles - No use of accessory muscles in breathing.  Abdomen Note: Abdomen soft. Nontender, nondistended. No guarding. No umbilical no other hernias   Rectal Note: Held off on any digital rectal exam today.   Peripheral Vascular Upper Extremity Inspection - Left - Not Gangrenous, No Petechiae. Right - Not Gangrenous, No Petechiae.  Neurologic Neurologic evaluation reveals -normal attention span and ability to concentrate, able to name objects and repeat phrases. Appropriate fund of knowledge and normal coordination.  Neuropsychiatric Mental status exam performed with findings of-able to articulate well with normal speech/language, rate, volume and coherence and no evidence of hallucinations, delusions, obsessions or homicidal/suicidal ideation. Orientation-oriented X3.  Musculoskeletal Global Assessment Gait and Station - normal gait and station.  Lymphatic General Lymphatics Description - No Generalized lymphadenopathy.    Assessment & Plan Linda Hector MD; 05/28/2016 6:15 PM) RECTAL CANCER (C20) Impression: Rectal cancer within a large polyp at/slightly distal to the colorectal anastomosis from prior resection for debulking rectal adenomatous polyp. Therefore, Status post TEM resection. T2 cancer. Margins negative.  Standard of care is low anterior resection. This would be very low with probable coloanal handsewn anastomosis and protective loop ileostomy.  Challenge with her she's already had a prior low anterior resection for a bulky polyp. Required vaginal repair. Another challenge will be getting adequate lymph nodes for this but hopefully with more complete better mesorectal excision and as well as  ensuring high ligation of the IMA/IMV I will get better sampling/staging.  I spent over 45 minutes talking with the patient and her family about numerous issues.  I recommend she continue pain control. Okay to use oxycodone as needed. Continue ibuprofen. Warm soaks.  Recommend she control diarrhea more aggressively as possible. No upper limit on the Imodium. She is worried about getting constipated. I noted to gradually increased Imodium as tolerated to see if that helps.  Consider flaxseed MiraLAX. Continue Gas-X as needed. Trying decrease flatulence. Hopefully that  will help as well. PREOP COLON - ENCOUNTER FOR PREOPERATIVE EXAMINATION FOR GENERAL SURGICAL PROCEDURE (Z01.818) Current Plans You are being scheduled for surgery - Our schedulers will call you.  You should hear from our office's scheduling department within 5 working days about the location, date, and time of surgery. We try to make accommodations for patient's preferences in scheduling surgery, but sometimes the OR schedule or the surgeon's schedule prevents [place] from making those accommodations.  If you have not heard from our office [phone]) in 5 working days, call the office and ask for your surgeon's nurse.  If you have other questions about your diagnosis, plan, or surgery, call the office and ask for your surgeon's nurse.  Written instructions provided Pt Education - [facility] Colon Bowel Prep 2015 Miralax/Antibiotics Restarted Neomycin Sulfate 500MG, 2 (two) Tablet SEE NOTE, #6, 05/28/2016, No Refill. Local Order: TAKE TWO TABLETS AT 2 PM, 3 PM, AND 10 PM THE DAY PRIOR TO SURGERY Continued Flagyl 500MG, 1 (one) Tablet two times daily, #14, 05/28/2016, No Refill. Local Order: Take at 2pm, 3pm, and 10pm the day prior to your colon operation Pt Education - [facility] Colectomy post-op instructions: discussed with patient and provided information. Pt Education - [facility] Good Bowel Health (Gross) Pt Education - [facility] Pain Control  (Gross) Pt Education - Pamphlet Given - Laparoscopic Colorectal Surgery: discussed with patient and provided information.   ADDENDUM:    Concern by gynecology Dr. Toney Rakes to remove at least one if not both ovaries given a large ovarian cyst and strong family history of cancers in this postmenopausal woman.  Gynecological oncology not have able to be available that day.  I will proceed with bilateral oophrectomies at the same time.

## 2016-07-01 NOTE — Op Note (Signed)
Preoperative diagnosis: Rectal cancer Postoperative diagnosis: Same  Procedure: Cystoscopy with placement of bilateral lighted ureteral stents   Surgeon: Bernestine Amass M.D.  Anesthesia: Gen.  Indications: Patient has an advanced rectal cancer. She is to undergo attempt at low anterior resection. Because of her malignancy as well as prior surgeries we have been asked by general surgery to place lighted ureteral stents to help with ureteral identification and avoidance of injury.     Technique and findings: Patient was brought the operating room where she had successful induction of general anesthesia. She was placed in lithotomy position prepped and draped in usual manner. Appropriate surgical timeout was performed. Cystoscopy revealed unremarkable bladder. Both ureteral orifices were normal in position and appearance. A guidewire was placed up the right ureteral orifice and confirmed to be in the renal pelvis with fluoroscopic control. The ureteral stent was then placed to the 20 cm marking. An analogous procedure was performed on the left side. A Foley catheter was then placed and the ureteral stents were secured to the Foley. The inside fiberoptics was then placed through both stents and also secured. At this point Dr. gross will assume care of the patient and dictate his portion of the procedure separately. The plan is for him to remove the stents when there are no longer necessary.

## 2016-07-01 NOTE — Interval H&P Note (Signed)
History and Physical Interval Note:  07/01/2016 8:16 AM  [name]  has presented today for surgery, with the diagnosis of RECTAL CANCER  The various methods of treatment have been discussed with the patient and family. After consideration of risks, benefits and other options for treatment, the patient has consented to  Procedure(s): XI ROBOTIC ASSISTED LOWER ANTERIOR RESECTION WITH COLOANAL ANASTOMOSIS (N/A) PROBABLE TEMPORARY DIVERTING LOOP ILEOSTOMY (N/A) RIGID PROCTOSCOPY (N/A) OOPHORECTOMY (Bilateral) CYSTOSCOPY WITH Bilateral STENT PLACEMENT (Bilateral) as a surgical intervention .  The patient's history has been reviewed, patient examined, no change in status, stable for surgery.  I have reviewed the patient's chart and labs.  Questions were answered to the patient's satisfaction.     Yang Rack C.

## 2016-07-01 NOTE — Op Note (Addendum)
07/01/2016  3:53 PM  PATIENT:  [name]  60 y.o. 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  PROCEDURE:  : XI ROBOTIC ASSISTED REDO LOWER ANTERIOR  RECTOSIGMOID RESECTION  COLOANAL Handsewn ANASTOMOSIS  SPLENIC FLEXURE MOBILIZATION BILATERAL SALPINGO-OOPHORECTOMY LAPAROSCOPIC & ROBOTIC LYSIS OF ADHESIONS X 2 HOURS (33% of case) DIVERTING LOOP ILEOSTOMY  SURGEON:  [name], MD  ASSISTANT: Leighton Ruff, MD  ANESTHESIA:   local and general  EBL:  Total I/O In: 2500 [I.V.:2000; IV Piggyback:500] Out: 525 [Urine:75; Other:200; Blood:250]  Delay start of Pharmacological VTE agent (>24hrs) due to surgical blood loss or risk of bleeding:  no  DRAINS: (19 fR) Blake drain(s) in the PELVIS   SPECIMEN:  Source of Specimen:  1.  Rectosigmoid colon with old anastomosis  2.  R adnexa.  L adnexa w enlarged ovary  DISPOSITION OF SPECIMEN:  PATHOLOGY  COUNTS:  YES  PLAN OF CARE: Admit to inpatient   PATIENT DISPOSITION:  PACU - hemodynamically stable.  INDICATION:    Patient with history of any prior abdominal surgeries.  Had large polyp in proximal rectum that required open low anterior resection.  Required vaginal repair and diverting loop ileostomy.  Stricture dilation.  Loop ileostomy takedown.  This was 2007-2008.  Developed incisional hernia.  Repaired laparoscopically with intraperitoneal mesh proceed 2010.  Follow-up colonoscopy revealed a polyp mass at the colorectal anastomosis.  I did TEM partial proctectomy of the mass 6 weeks ago.  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  Margins negative.  I recommended redo low anterior resection.  She would require a (hopefully temporary) diverting loop ileostomy again.  Patient also has a strong family history of ovarian cancer with an ovarian cystic mass on the left side.  Hopefully just benign.  However discussions with gynecology and gynecological oncology warranted recommendation of bilateral salpingo-oophorectomy.  I was asked to proceed with this during the case as well.  Technique risks benefits alternatives discussed with patient.  I recommended segmental resection:  The anatomy & physiology of the digestive tract was discussed.  The pathophysiology was discussed.  Natural history risks without surgery was discussed.   I worked to give an overview of the disease and the frequent need to have multispecialty involvement.  I feel the risks of no intervention will lead to serious problems that outweigh the operative risks; therefore, I recommended a partial colectomy to remove the pathology.  Laparoscopic & open techniques were discussed.   Risks such as bleeding, infection, abscess, leak, reoperation, possible ostomy, hernia, heart attack, death, and other risks were discussed.  I noted a good likelihood this will help address the problem.   Goals of post-operative recovery were discussed as well.  We will work to minimize complications.  Educational materials on the pathology had been given in the office.  Questions were answered.    The patient expressed understanding & wished to proceed with surgery.  OR FINDINGS:   Patient had very dense adhesions to the right upper quadrant from her prior open cholecystectomy.  She and especially dense adhesions of greater omentum and small intestine to the central and suprapubic  midline intraperitoneal mesh.  Some small hernias centrally.  Swiss cheese type.  No obvious metastatic disease on visceral parietal peritoneum or liver.   Patient had a very atrophic  right fallopian tube and ovary.  On the left had a racquetball-sized ovary primarily replaced with fluid/cyst.  Moved.  Some fibroids on a regular size uterus.  Old colorectal anastomosis in mid rectum.  Breakdown of the TEM colorectal posterior wall closure into the retrorectal presacral space.   The new coloanal anastomosis rests 1 cm from the anal verge by rigid proctoscopy.  It is a handsewn end-descending colon to end-rectal stump.  The proximal end of the loop ileostomy orifice is superior at 12:00.  The inferior distal orifice is at 6:00/inferior    DESCRIPTION:   Informed consent was confirmed.  The patient underwent general anaesthesia without difficulty.  The patient was positioned appropriately.  VTE prevention in place.  The patient's abdomen was clipped, prepped, & draped in a sterile fashion.  Surgical timeout confirmed our plan.  The patient was positioned in reverse Trendelenburg.  Abdominal entry was gained using Varess technique with a trach hook on the anterior abdominal wall fascia in the left right upper abdomen.  Entry was clean.  I induced carbon dioxide insufflation.  Camera inspection revealed no injury.  Patient had dense adhesions of omentum and small intestine to the intra-abdominal wall.  These were gradually freed off using primarily cold focused sharp dissection.  Gradually, extra ports were carefully placed under direct laparoscopic visualization.  And up having to take the mesh off the preperitoneal space to avoid bowel injury.   I reflected the greater omentum and the upper abdomen the small bowel in the upper abdomen as possible.  Patient had moderate small bowel adhesions and omental adhesions down to the pelvis and lower abdomen..  The patient was carefully positioned.  The Intuitive daVinci robot was carefully docked with camera & instruments carefully placed.  I continued lysis of adhesions robotically.  I freed the small intestine off the left colon and the  uterus and adnexa.  We mobilized it out of the pelvis.  Freed off some interloop adhesions on the small bowel.  Patient had had lighted stents placed preoperatively.  However they were not working despite troubleshooting.  We are able to find good candidates for right and left ureters and kept them in the retroperitoneal position and diligent and avoiding injury towards them.  The prior low anterior resection, side to mobilize a lateral to medial fashion.  And had prominent redundant epiploic appendages and freed off the bladder uterus and adnexa.  Freed off the pelvic brim.  Came along the left paracolic gutter up towards the splenic flexure.  Mobilized in a lateral medial fashion off the retroperitoneum.  Off the gonadal vessels and left ureter.  Off the kidney.  I elevated the mesentery anteriorly.  It seated little bit of the stump of the superior hemorrhoidal and left colic arteries and veins.  The elevated off the retroperitoneum.  After making sure that the left ureter and other important structures were out of the way, I ligated and transected them with a vessel sealer.  I came more proximally up above the ligament of Treitz and did high ligation of inferior mesenteric vein as well.  A followed up towards the splenic flexure.  I mobilized the left colon and splenic flexure in a medial to lateral fashion.  I elevated off the retroperitoneum.  I mobilized the greater omentum off the proximal  descending colon and splenic flexure.  Mobilize splenic flexure and a lateral to medial and superior to inferior fashion.  That allowed excellent mobility of the left colon from the mid transverse down.  Proceeded with pelvic dissection.  Mobilized some midline structures that were adherent to the colon.  Suspicious for remaining left colon mesentery adherent to the presacral space.  Carefully freed that off, being cognizant of the ureters and stayed away from them.  Focus skeletonize and transected off.  Follow down  more distally.  Eventually came into the get of where the proctectomy from the TEM was.  There was dehiscence of the posterior part of the colorectal anastomosis.  Freed off some lateral stalk attachments and came around anteriorly as well.  Freed off across the anterior anastomosis which was intact to the rectum more distally.  Transected that off.  Remove that out.  We did not irrigation assured hemostasis.  Proceeded with removing the ovaries.  I elevated the uterus and found the fallopian tube coming off the right side.  A lot of scarring in planes.  Eventually mobilize a very atrophic fallopian tube and best candidate for an ovary off the pelvic wall and skeletonized and freed off.  Removed it off piecemeal since it was very atrophic out the 12 mm port.   I then mobilized the enlarged cystic left ovary off for its attachment to the bladder and left pelvic sidewall.  Found the fallopian tube was takeoff of the uterus.  After isolate skeletonizing transected it.  Then came and removed it in a lateral to medial fashion.  Identified ovarian vessels and carefully isolated skeletonized and ligated them as well.  Place that an Endo Catch bag.  Proceeded to mobilize the ascending colon, cecum, and terminal ileum in a lateral to medial fashion.  I freed off interloop adhesions of the terminal ileum until I came to where the obvious prior stapled ileoileal anastomosis from the prior loop ileostomy takedown was, about 20 cm proximal to the ileocecal valve..  Eventually mobilized well off the retroperitoneum until we had good reach of a straightened out distal ileum into the premarked ostomy area where the right paramedian 12 mm port was.  I made a mesenteric defect in this most mobile part of the distal ileum and placed a Red Robinson catheter through it to act as a sling to help hold the loop of distal ileum up later.  We did copious irrigation.  Hemostasis was good.    I placed the left adnexa in an Endo Catch bag  and removed it out the anus/rectal stump.  I placed a wound protector through the anus and rectal stump and remove the colorectal anastomosis and sigmoid and distal descending colon out the anus.  I removed the wound protector.    We were able to bring up the loop of ileum with a red Robinson catheter up through the premarked right paramedian region where the port had been.   Excised a disc of skin and subcutaneous tissue.  I transected the anterior rectus fascia transversely.  I split the rectus muscle.  Able to get into the peritoneum.Tissues were thin at the fascia.  Carefully brought the loop of ileum up through the fashion up to the skin.   The drain was passed transanally into the peritoneum.  It was grasped laparoscopically and brought out the the 5 mm RLQ port.   I proceeded to do a coloanal handsewn anastomosis.  I placed 2-0 Vicryl sutures in the proximal  2 cm of rectal cuff in an heptagonal pattern.   I placed the Blake drain up transanally so that the tip would be just proximal to the sphincters.  It was brought out the right lower quadrant assistant port.  I then transected the anterior wall of the colon a few centimeters distal to the anal verge.  I got bleeding healthy mucosa.  Proceeded to do anastomosis of the end of this colon to the end of the rectal stump with these pre-thrown  interrupted sutures.   I then completed transection of the  posterior remaining colon and its mesentery.  I then completed the anastomosis on the posterior circumference.  I placed a few extra Vicryl sutures interrupted until I had a good circumferential water tight seal at this coloanal handsewn end-to-end anastomosis.  My index finger easily intubated across this.  It was just proximal to the sphincters.  Sphincter tone was good and intact.    We changed gowns and gloves.  We performed diagnostic laparoscopy.  We placed antibiotic irrigation (clindamycin/gentamicin).  Hemostasis was good.   Ureters & bowel uninjured.   The anastomosis looked healthy.  No tension.  I was able to bring some greater omentum down to help fill the pelvis.   I closed the 64mm port sites using Monocryl stitch and sterile dressing.   the drain was secured with 2-0 suture.  Cover with a dressing.  I matured the ileostomy with interrupted 3-0 Vicryl sutures.  I kept the red Robinson catheter as a ring bolster to help keep the ileostomy on the surface of the skin since she had poor tissues and a thick abdominal wall given her obesity.   The proximal end of the loop ileostomy orifice is superior at 12:00.  The inferior distal orifice is at 6:00/inferior.  We allowed the antibiotic irrigation to gradually be pulled out the pelvic Blake drain as I was closing a maturing the ostomy.    Patient is being extubated go to recovery room. I discussed postop care with the patient in detail the office & in the holding area. Instructions are written. I updated the status of the patient to the patient's family.  I made recommendations.  I answered questions.  Understanding & appreciation was expressed.  [name], M.D., F.A.C.S. Gastrointestinal and Minimally Invasive Surgery [facility] [address] [place], [place] [address] [phone] Main / Paging

## 2016-07-01 NOTE — Discharge Instructions (Signed)
Ostomy Support Information ° °You’ve heard that people get along just fine with only one of their eyes, or one of their lungs, or one of their kidneys. But you also know that you have only one intestine and only one bladder, and that leaves you feeling awfully empty, both physically and emotionally: You think no other people go around without part of their intestine with the ends of their intestines sticking out through their abdominal walls.  ° °YOU ARE NOT ALONE.  There are nearly three quarters of a million people in the [place] who have an ostomy; people who have had surgery to remove all or part of their colons or bladders.  ° °There is even a national association, the [employer] with over 350 local affiliated support groups that are organized by volunteers who provide peer support and counseling. [employer] has a toll free telephone num-ber, 800-826-0826 and an educational, interactive website, www.ostomy.org  ° °An ostomy is an opening in the belly (abdominal wall) made by surgery. Ostomates are people who have had this procedure. The opening (stoma) allows the kidney or bowel to discharge waste. An external pouch covers the stoma to collect waste. Pouches are are a simple bag and are odor free. Different companies have disposable or reusable pouches to fit one's lifestyle. An ostomy can either be temporary or permanent.  ° °THERE ARE THREE MAIN TYPES OF OSTOMIES °· Colostomy. A colostomy is a surgically created opening in the large intestine (colon). °· Ileostomy. An ileostomy is a surgically created opening in the small intestine. °· Urostomy. A urostomy is a surgically created opening to divert urine away from the bladder. ° °OSTOMY Care ° °The following guidelines will make care of your colostomy easier. Keep this information close by for quick reference. ° °Helpful DIET hints °Eat a well-balanced diet including vegetables and fresh fruits. Eat on a regular schedule. Drink at least 6 to 8  glasses of fluids daily. °Eat slowly in a relaxed atmosphere. Chew your food thoroughly. Avoid chewing gum, smoking, and drinking from a straw. This will help decrease the amount of air you swallow, which may help reduce gas. °Eating yogurt or drinking buttermilk may help reduce gas. ° °To control gas at night, do not eat after 8 p.m. This will give your bowel time to quiet down before you go to bed. ° °If gas is a problem, you can purchase Beano. Sprinkle Beano on the first bite of food before eating to reduce gas. It has no flavor and should not change the taste of your food. You can buy Beano over the counter at your local drugstore. ° °Foods like fish, onions, garlic, broccoli, asparagus, and cabbage produce odor. Although your pouch is odor-proof, if you eat these foods you may notice a stronger odor when emptying your pouch. If this is a concern, you may want to limit these foods in your diet. ° °If you have an ileostomy, you will have chronic diarrhea & need to drink more liquids to avoid getting dehydrated.  Consider antidiarrheal medicine like imodium (loperamide) or Lomotil to help slow down bowel movements / diarrhea into your ileostomy bag. ° °GETTING TO GOOD BOWEL HEALTH WITH AN ILEOSTOMY °. °Irregular bowel habits such as constipation and diarrhea can lead to many problems over time.  The goal: 3-6 small BOWEL MOVEMENTS A DAY!  To have soft, regular bowel movements:  °• Drink plenty of fluids, consider 4-6 tall glasses of water a day.   ° °Controlling   diarrheA ° °o Switch to liquids and simpler foods for a few days to avoid stressing your intestines further. °o Avoid dairy products (especially milk & ice cream) for a short time.  The intestines often can lose the ability to digest lactose when stressed. °o Avoid foods that cause gassiness or bloating.  Typical foods include beans and other legumes, cabbage, broccoli, and dairy foods.  Every person has some sensitivity to other foods, so listen to our  body and avoid those foods that trigger problems for you. °o Adding fiber (Citrucel, Metamucil, psyllium, Miralax) gradually can help thicken stools by absorbing excess fluid and retrain the intestines to act more normally.  Slowly increase the dose over a few weeks.  Too much fiber too soon can backfire and cause cramping & bloating. °o Probiotics (such as active yogurt, Align, etc) may help repopulate the intestines and colon with normal bacteria and calm down a sensitive digestive tract.  Most studies show it to be of mild help, though, and such products can be costly. °o Medicines: °- Bismuth subsalicylate (ex. Kayopectate, Pepto Bismol) every 30 minutes for up to 6 doses can help control diarrhea.  Avoid if pregnant. °- Loperamide (Immodium) can slow down diarrhea.  Start with two tablets (4mg total) first and then try one tablet every 6 hours.  Avoid if you are having fevers or severe pain.  If you are not better or start feeling worse, stop all medicines and call your doctor for advice °o Call your doctor if you are getting worse or not better.  Sometimes further testing (cultures, endoscopy, X-ray studies, bloodwork, etc) may be needed to help diagnose and treat the cause of the diarrhea. ° °TROUBLESHOOTING IRREGULAR BOWELS °1) Avoid extremes of bowel movements (no bad constipation/diarrhea) °2) Miralax 17gm mixed in 8oz. water or juice-daily. May use twice a day as needed  °3) Gas-x,Phazyme, etc. as needed for gas & bloating.  °4) Soft,bland diet. No spicy,greasy,fried foods.  °5) Prilosec (omeprazole) over-the-counter as needed  °6) May hold gluten/wheat products from diet to see if symptoms improve.  °7)  May try probiotics (Align, Activa, etc) to help calm the bowels down °7) If symptoms become worse call back immediately. ° ° °Applying the pouching system °To apply your pouch, follow these steps: ° °Place all your equipment close at hand before removing your pouch. ° °Wash your hands. ° °Stand or sit in  front of a mirror. Use the position that works best for you. Remember that you must keep the skin around the stoma wrinkle-free for a good seal. ° °Gently remove the used pouch (1-piece system) or the pouch and old wafer (2-piece system). Empty the pouch into the toilet. Save the closure clip to use again. ° °Wash the stoma itself and the skin around the stoma. Your stoma may bleed a little when being washed. This is normal. Rinse and pat dry. You may use a wash cloth or soft paper towels (like Bounty), mild soap (like Dial, Safeguard, or Ivory), and water. Avoid soaps that contain perfumes or lotions. ° °For a new pouch (1-piece system) or a new wafer (2-piece system), measure your stoma using the stoma guide in each box of supplies. ° °Trace the shape of your stoma onto the back of the new pouch or the back of the new wafer. Cut out the opening. Remove the paper backing and set it aside. ° °Optional: Apply a skin barrier powder to surrounding skin if it is irritated (bare or weeping),   and dust off the excess. °Optional: Apply a skin-prep wipe (such as Skin Prep or All-Kare) to the skin around the stoma, and let it dry. Do not apply this solution if the skin is irritated (red, tender, or broken) or if you have shaved around the stoma. °Optional: Apply a skin barrier paste (such as Stomahesive, Coloplast, or Premium) around the opening cut in the back of the pouch or wafer. Allow it to dry for 30 to 60 seconds. ° °Hold the pouch (1-piece system) or wafer (2-piece system) with the sticky side toward your body. Make sure the skin around the stoma is wrinkle-free. Center the opening on the stoma, then press firmly to your abdomen (Fig. 4). Look in the mirror to check if you are placing the pouch, or wafer, in the right position. For a 2-piece system, snap the pouch onto the wafer. Make sure it snaps into place securely. ° °Place your hand over the stoma and the pouch or wafer for about 30 seconds. The heat from your  hand can help the pouch or wafer stick to your skin. ° °Add deodorant (such as Super Banish or Nullo) to your pouch. Other options include food extracts such as vanilla oil and peppermint extract. Add about 10 drops of the deodorant to the pouch. Then apply the closure clamp. Note: Do not use toxic ° chemicals or commercial cleaning agents in your pouch. These substances may harm the stoma. ° °Optional: For extra seal, apply tape to all 4 sides around the pouch or wafer, as if you were framing a picture. You may use any brand of medical adhesive tape. °Change your pouch every 5 to 7 days. Change it immediately if a leak occurs.  Wash your hands afterwards. ° °If you are wearing a 2-piece system, you may use 2 new pouches per week and alternate them. Rinse the pouch with mild soap and warm water and hang it to dry for the next day. Apply the fresh pouch. Alternate the 2 pouches like this for a week. After a week, change the wafer and begin with 2 new pouches. Place the old pouches in a plastic bag, and put them in the trash. ° ° ° °Tips for colostomy care ° °Applying Your Pouch °You may stand or sit to apply your pouch. ° °Keep the skin where you apply the pouch wrinkle-free. If the skin around the pouch is wrinkled, the seal may break when your skin stretches. ° °If hair grows close to your stoma, you may trim off the hair with scissors, an electric razor, or a safety razor. ° °Always have a mirror nearby so you can get a better view of your stoma. ° °When you apply a new pouch, write the date on the adhesive tape. This will remind you of when you last changed your pouch. ° °Changing Your Pouch °The best time to change your pouch is in the morning, before eating or drinking anything. Your stoma can function at any time, but it will function more after eating or drinking. ° °Emptying Your Pouch °Empty your pouch when it is one-third full (of urine, stool, and/or gas). If you wait until your pouch is fuller than this,  it will be more difficult to empty and more noticeable. °When you empty your pouch, either put toilet paper in the toilet bowl first, or flush the toilet while you empty the pouch. This will reduce splashing. You can empty the pouch between your legs or to one side while sitting,   or while standing or stooping. If you have a 2-piece system, you can snap off the pouch to empty it. Remember that your stoma may function during this time. °If you wish to rinse your pouch after you empty it, a turkey baster can be helpful. When using a baster, squirt water up into the pouch through the opening at the °bottom. With a 2-piece system, you can snap off the pouch to rinse it. After rinsing  your pouch, empty it into the toilet. °When rinsing your pouch at home, put a few granules of Dreft soap in the rinse water. This helps lubricate and freshen your pouch. °The inside of your pouch can be sprayed with non-stick cooking oil (Pam spray). This may help reduce stool sticking to the inside of the pouch. ° °Bathing °You may shower or bathe with your pouch on or off. Remember that your stoma may function during this time. ° °The materials you use to wash your stoma and the skin around it should be clean, but they do not need to be sterile. ° °Wearing Your Pouch °During hot weather, or if you perspire a lot in general, wear a cover over your pouch. This may prevent a rash on your skin under the pouch. Pouch covers are sold at ostomy supply stores. °Wear the pouch inside your underwear for better support. °Watch your weight. Any gain or loss of 10 to 15 pounds or more can change the way your pouch fits. ° °Going Away From Home °A collapsible cup (like those that come in travel kits) or a soft plastic squirt bottle with a pull-up top (like a travel bottle for shampoo) can be used for rinsing your pouch when you are away from home. Tilt the opening of the pouch at an upward angle when using a cup to rinse. ° °Carry wet wipes or extra  tissues to use in public bathrooms. ° °Carry an extra pouching system with you at all times. ° °Never keep ostomy supplies in the glove compartment of your car. Extreme heat or cold can damage the skin barriers and adhesive wafers on the pouch. ° °When you travel, carry your ostomy supplies with you at all times. Keep them within easy reach. Do not pack ostomy supplies in baggage that will be checked or otherwise separated from you, because your baggage might be lost. If you’re traveling out of the country, it is helpful to have a letter stating that you are carrying ostomy supplies as a medical necessity. ° °If you need ostomy supplies while traveling, look in the yellow pages of the telephone book under “Surgical Supplies.” Or call the local ostomy organization to find out where supplies are available. ° °Do not let your ostomy supplies get low. Always order new pouches before you use the last one. ° °Reducing Odor °Limit foods such as broccoli, cabbage, onions, fish, and garlic in your diet to help reduce odor. °Each time you empty your pouch, carefully clean the opening of the pouch, both inside and outside, with toilet paper. °Rinse your pouch 1 or 2 times daily after you empty it (see directions for emptying your pouch and going away from home). °Add deodorant (such as Super Banish or Nullo) to your pouch. °Use air deodorizers in your bathroom. °Do not add aspirin to your pouch. Even though aspirin can help prevent odor, it could cause ulcers on your stoma. ° °When to call the doctor °Call the doctor if you have any of the following symptoms: °Purple, black,   or white stoma Severe cramps lasting more than 6 hours Severe watery discharge from the stoma lasting more than 6 hours No output from the colostomy for 3 days Excessive bleeding from your stoma Swelling of your stoma to more than 1/2-inch larger than usual Pulling inward of your stoma below skin level Severe skin irritation or deep ulcers Bulging  or other changes in your abdomen  When to call your ostomy nurse Call your ostomy/enterostomal therapy (ET) nurse if any of the following occurs: Frequent leaking of your pouching system Change in size or appearance of your stoma, causing discomfort or problems with your pouch Skin rash or rawness Weight gain or loss that causes problems with your pouch      FREQUENTLY ASKED QUESTIONS   Why havent you met any of these folks who have an ostomy?  Well, maybe you have! You just did not recognize them because an ostomy doesn't show. It can be kept secret if you wish. Why, maybe some of your best friends, office associates or neighbors have an ostomy ... you never can tell.   People facing ostomy surgery have many quality-of-life questions like:  Will you bulge? Smell? Make noises? Will you feel waste leaving your body? Will you be a captive of the toilet? Will you starve? Be a social outcast? Get/stay married? Have babies? Easily bathe, go swimming, bend over?  OK, lets look at what you can expect:   Will you bulge?  Remember, without part of the intestine or bladder, and its contents, you should have a flatter tummy than before. You can expect to wear, with little exception, what you wore before surgery ... and this in-cludes tight clothing and bathing suits.   Will you smell?  Today, thanks to modern odor proof pouching systems, you can walk into an ostomy support group meeting and not smell anything that is foul or offensive. And, for those with an ileostomy or colostomy who are concerned about odor when emptying their pouch, there are in-pouch deodorants that can be used to eliminate any waste odors that may exist.   Will you make noises?  Everyone produces gas, especially if they are an air-swallower. But intestinal sounds that occur from time to time are no differ-ent than a gurgling tummy, and quite often your clothing will muffle any sounds.   Will you feel the waste discharges?   For those with a colostomy or ileostomy there might be a slight pressure when waste leaves your body, but understand that the intestines have no nerve endings, so there will be no unpleasant sensations. Those with a urostomy will probably be unaware of any kidney drainage.   Will you be a captive of the toilet?  Immediately post-op you will spend more time in the bathroom than you will after your body recovers from surgery. Every person is different, but on average those with an ileostomy or urostomy may empty their pouches 4 to 6 times a day; a little  less if you have a colostomy. The average wear time between pouch system changes is 3 to 5 days and the changing process should take less than 30 minutes.   Will I need to be on a special diet? Most people return to their normal diet when they have recovered from surgery. Be sure to chew your food well, eat a well-balanced diet and drink plenty of fluids. If you experience problems with a certain food, wait a couple of weeks and try it again.  Will there be  odor and noises? °Pouching systems are designed to be odor-proof or odor-resistant. There are deodorants that can be used in the pouch. Medications are also available to help reduce odor. Limit gas-producing foods and carbonated beverages. You will experience less gas and fewer noises as you heal from surgery. ° °How much time will it take to care for my ostomy? °At first, you may spend a lot of time learning about your ostomy and how to take care of it. As you become more comfortable and skilled at changing the pouching system, it will take very little time to care for it.  ° °Will I be able to return to work? °People with ostomies can perform most jobs. As soon as you have healed from surgery, you should be able to return to work. Heavy lifting (more than 10 pounds) may be discouraged.  ° °What about intimacy? °Sexual relationships and intimacy are important and fulfilling aspects of your life. They  should continue after ostomy surgery. Intimacy-related concerns should be discussed openly between you and your partner.  ° °Can I wear regular clothing? °You do not need to wear special clothing. Ostomy pouches are fairly flat and barely noticeable. Elastic undergarments will not hurt the stoma or prevent the ostomy from functioning.  ° °Can I participate in sports? °An ostomy should not limit your involvement in sports. Many people with ostomies are runners, skiers, swimmers or participate in other active lifestyles. Talk with your caregiver first before doing heavy physical activity. ° °Will you starve?  °Not if you follow doctor’s orders at each stage of your post-op adjustment. There is no such thing as an “ostomy diet”. Some people with an ostomy will be able to eat and tolerate anything; others may find diffi-culty with some foods. Each person is an individual and must determine, by trial, what is best for them. A good practice for all is to drink plenty of water.  ° °Will you be a social outcast?  °Have you met anyone who has an ostomy and is a social outcast? Why should you be the first? Only your attitude and self image will effect how you are treated. No confi-dent person is an outcast.  ° ° ° °PROFESSIONAL HELP  °Resources are available if you need help or have questions about your ostomy.  ° °· Specially trained nurses called Wound, Ostomy Continence Nurses (WOCN) are available for consultation in most major medical centers. ° °· Consider getting an ostomy consult with Kathy Probst at [facility] to help troubleshoot stoma pouch fittings and other issues with your ostomy: [phone] ° °· The United Ostomy Association (UOA) is a group made up of many local chapters throughout the [place]. These local groups hold meetings and provide support to prospective and existing ostomates. They sponsor educational events and have qualified visitors to make personal or telephone visits. Contact  the UOA for the chapter nearest you and for other educational publications. ° °· More detailed information can be found in Colostomy Guide, a publication of the United Ostomy Association (UOA). Contact UOA at 1-800-826-0826 or visit their web site at www.uoaa.org. The website contains links to other sites, suppliers and resources. ° °· [facility] Secure Start Services: °· Start at the website to enlist for support.  Your Wound Ostomy (WOCN) nurse may have started this process. https://www.[facility].com/en/securestart °· Secure Start services are designed to support people as they live their lives with an ostomy or neurogenic bladder. Enrolling is easy and at no cost to the   patient. We realize that each person's needs and life journey are different. Through Secure Start services, we want to help people live their life, their way.  SURGERY: POST OP INSTRUCTIONS (Surgery for small bowel obstruction, colon resection, etc)   ######################################################################  EAT Gradually transition to a high fiber diet with a fiber supplement over the next few days after discharge  WALK Walk an hour a day.  Control your pain to do that.    CONTROL PAIN Control pain so that you can walk, sleep, tolerate sneezing/coughing, go up/down stairs.  HAVE A BOWEL MOVEMENT DAILY Keep your bowels regular to avoid problems.  OK to try a laxative to override constipation.  OK to use an antidairrheal to slow down diarrhea.  Call if not better after 2 tries  CALL IF YOU HAVE PROBLEMS/CONCERNS Call if you are still struggling despite following these instructions. Call if you have concerns not answered by these instructions  ######################################################################   DIET Follow a light diet the first few days at home.  Start with a bland diet such as soups, liquids, starchy foods, low fat foods, etc.  If you feel full, bloated, or constipated, stay on a ful  liquid or pureed/blenderized diet for a few days until you feel better and no longer constipated. Be sure to drink plenty of fluids every day to avoid getting dehydrated (feeling dizzy, not urinating, etc.). Gradually add a fiber supplement to your diet over the next week.  Gradually get back to a regular solid diet.  Avoid fast food or heavy meals the first week as you are more likely to get nauseated. It is expected for your digestive tract to need a few months to get back to normal.  It is common for your bowel movements and stools to be irregular.  You will have occasional bloating and cramping that should eventually fade away.  Until you are eating solid food normally, off all pain medications, and back to regular activities; your bowels will not be normal. Focus on eating a low-fat, high fiber diet the rest of your life (See Getting to [place], below).  CARE of your INCISION or WOUND It is good for closed incision and even open wounds to be washed every day.  Shower every day.  Short baths are fine.  Wash the incisions and wounds clean with soap & water.    If you have a closed incision(s), wash the incision with soap & water every day.  You may leave closed incisions open to air if it is dry.   You may cover the incision with clean gauze & replace it after your daily shower for comfort. If you have skin tapes (Steristrips) or skin glue (Dermabond) on your incision, leave them in place.  They will fall off on their own like a scab.  You may trim any edges that curl up with clean scissors.  If you have staples, set up an appointment for them to be removed in the office in 10 days after surgery.  If you have a drain, wash around the skin exit site with soap & water and place a new dressing of gauze or band aid around the skin every day.  Keep the drain site clean & dry.    If you have an open wound with packing, see wound care instructions.  In general, it is encouraged that you remove your  dressing and packing, shower with soap & water, and replace your dressing once a day.  Pack the  wound with clean gauze moistened with normal (0.9%) saline to keep the wound moist & uninfected.  Pressure on the dressing for 30 minutes will stop most wound bleeding.  Eventually your body will heal & pull the open wound closed over the next few months.  °Raw open wounds will occasionally bleed or secrete yellow drainage until it heals closed.  Drain sites will drain a little until the drain is removed.  Even closed incisions can have mild bleeding or drainage the first few days until the skin edges scab over & seal.   °If you have an open wound with a wound vac, see wound vac care instructions. ° ° ° ° °ACTIVITIES as tolerated °Start light daily activities --- self-care, walking, climbing stairs-- beginning the day after surgery.  Gradually increase activities as tolerated.  Control your pain to be active.  Stop when you are tired.  Ideally, walk several times a day, eventually an hour a day.   °Most people are back to most day-to-day activities in a few weeks.  It takes 4-8 weeks to get back to unrestricted, intense activity. °If you can walk 30 minutes without difficulty, it is safe to try more intense activity such as jogging, treadmill, bicycling, low-impact aerobics, swimming, etc. °Save the most intensive and strenuous activity for last (Usually 4-8 weeks after surgery) such as sit-ups, heavy lifting, contact sports, etc.  Refrain from any intense heavy lifting or straining until you are off narcotics for pain control.  You will have off days, but things should improve week-by-week. °DO NOT PUSH THROUGH PAIN.  Let pain be your guide: If it hurts to do something, don't do it.  Pain is your body warning you to avoid that activity for another week until the pain goes down. °You may drive when you are no longer taking narcotic prescription pain medication, you can comfortably wear a seatbelt, and you can safely make  sudden turns/stops to protect yourself without hesitating due to pain. °You may have sexual intercourse when it is comfortable. If it hurts to do something, stop. ° °MEDICATIONS °Take your usually prescribed home medications unless otherwise directed.   °Blood thinners:  °Usually you can restart any strong blood thinners after the second postoperative day.  It is OK to take aspirin right away.    ° If you are on strong blood thinners (warfarin/Coumadin, Plavix, Xerelto, Eliquis, Pradaxa, etc), discuss with your surgeon, medicine PCP, and/or cardiologist for instructions on when to restart the blood thinner & if blood monitoring is needed (PT/INR blood check, etc).   ° ° °PAIN CONTROL °Pain after surgery or related to activity is often due to strain/injury to muscle, tendon, nerves and/or incisions.  This pain is usually short-term and will improve in a few months.  °To help speed the process of healing and to get back to regular activity more quickly, DO THE FOLLOWING THINGS TOGETHER: °1. Increase activity gradually.  DO NOT PUSH THROUGH PAIN °2. Use Ice and/or Heat °3. Try Gentle Massage and/or Stretching °4. Take over the counter pain medication °5. Take Narcotic prescription pain medication for more severe pain ° °Good pain control = faster recovery.  It is better to take more medicine to be more active than to stay in bed all day to avoid medications. °1.  Increase activity gradually °Avoid heavy lifting at first, then increase to lifting as tolerated over the next 6 weeks. °Do not “push through” the pain.  Listen to your body and avoid positions and maneuvers   than reproduce the pain.  Wait a few days before trying something more intense °Walking an hour a day is encouraged to help your body recover faster and more safely.  Start slowly and stop when getting sore.  If you can walk 30 minutes without stopping or pain, you can try more intense activity (running, jogging, aerobics, cycling, swimming, treadmill,  sex, sports, weightlifting, etc.) °Remember: If it hurts to do it, then don’t do it! °2. Use Ice and/or Heat °You will have swelling and bruising around the incisions.  This will take several weeks to resolve. °Ice packs or heating pads (6-8 times a day, 30-60 minutes at a time) will help sooth soreness & bruising. °Some people prefer to use ice alone, heat alone, or alternate between ice & heat.  Experiment and see what works best for you.  Consider trying ice for the first few days to help decrease swelling and bruising; then, switch to heat to help relax sore spots and speed recovery. °Shower every day.  Short baths are fine.  It feels good!  Keep the incisions and wounds clean with soap & water.   °3. Try Gentle Massage and/or Stretching °Massage at the area of pain many times a day °Stop if you feel pain - do not overdo it °4. Take over the counter pain medication °This helps the muscle and nerve tissues become less irritable and calm down faster °Choose ONE of the following over-the-counter anti-inflammatory medications: °Acetaminophen 500mg tabs (Tylenol) 1-2 pills with every meal and just before bedtime (avoid if you have liver problems or if you have acetaminophen in you narcotic prescription) °Naproxen 220mg tabs (ex. Aleve, Naprosyn) 1-2 pills twice a day (avoid if you have kidney, stomach, IBD, or bleeding problems) °Ibuprofen 200mg tabs (ex. Advil, Motrin) 3-4 pills with every meal and just before bedtime (avoid if you have kidney, stomach, IBD, or bleeding problems) °Take with food/snack several times a day as directed for at least 2 weeks to help keep pain / soreness down & more manageable. °5. Take Narcotic prescription pain medication for more severe pain °A prescription for strong pain control is often given to you upon discharge (for example: oxycodone/Percocet, hydrocodone/Norco/Vicodin, or tramadol/Ultram) °Take your pain medication as prescribed. °Be mindful that most narcotic prescriptions  contain Tylenol (acetaminophen) as well - avoid taking too much Tylenol. °If you are having problems/concerns with the prescription medicine (does not control pain, nausea, vomiting, rash, itching, etc.), please call us [phone] to see if we need to switch you to a different pain medicine that will work better for you and/or control your side effects better. °If you need a refill on your pain medication, you must call the office before 4 pm and on weekdays only.  By federal law, prescriptions for narcotics cannot be called into a pharmacy.  They must be filled out on paper & picked up from our office by the patient or authorized caretaker.  Prescriptions cannot be filled after 4 pm nor on weekends.   ° °WHEN TO CALL US [phone] °Severe uncontrolled or worsening pain  °Fever over 101 F (38.5 C) °Concerns with the incision: Worsening pain, redness, rash/hives, swelling, bleeding, or drainage °Reactions / problems with new medications (itching, rash, hives, nausea, etc.) °Nausea and/or vomiting °Difficulty urinating °Difficulty breathing °Worsening fatigue, dizziness, lightheadedness, blurred vision °Other concerns °If you are not getting better after two weeks or are noticing you are getting worse, contact our office [phone] for further advice.  We may   need to adjust your medications, re-evaluate you in the office, send you to the emergency room, or see what other things we can do to help. °The clinic staff is available to answer your questions during regular business hours (8:30am-5pm).  Please don’t hesitate to call and ask to speak to one of our nurses for clinical concerns.    °A surgeon from [facility] is always on call at the hospitals 24 hours/day °If you have a medical emergency, go to the nearest emergency room or call 911. ° °FOLLOW UP in our office °One the day of your discharge from the hospital (or the next business weekday), please call [facility] to set up  or confirm an appointment to see your surgeon in the office for a follow-up appointment.  Usually it is 2-3 weeks after your surgery.   °If you have skin staples at your incision(s), let the office know so we can set up a time in the office for the nurse to remove them (usually around 10 days after surgery). °Make sure that you call for appointments the day of discharge (or the next business weekday) from the hospital to ensure a convenient appointment time. °IF YOU HAVE DISABILITY OR FAMILY LEAVE FORMS, BRING THEM TO THE OFFICE FOR PROCESSING.  DO NOT GIVE THEM TO YOUR DOCTOR. ° °[facility] °[address], [address], [place], [place]  [address] ? °[phone] - Main °[phone] - Toll Free,  [phone] - Fax °[email or web address] ° °GETTING TO GOOD BOWEL HEALTH. °It is expected for your digestive tract to need a few months to get back to normal.  It is common for your bowel movements and stools to be irregular.  You will have occasional bloating and cramping that should eventually fade away.  Until you are eating solid food normally, off all pain medications, and back to regular activities; your bowels will not be normal.   °Avoiding constipation °The goal: ONE SOFT BOWEL MOVEMENT A DAY!    °Drink plenty of fluids.  Choose water first. °TAKE A FIBER SUPPLEMENT EVERY DAY THE REST OF YOUR LIFE °During your first week back home, gradually add back a fiber supplement every day °Experiment which form you can tolerate.   There are many forms such as powders, tablets, wafers, gummies, etc °Psyllium bran (Metamucil), methylcellulose (Citrucel), Miralax or Glycolax, Benefiber, Flax Seed.  °Adjust the dose week-by-week (1/2 dose/day to 6 doses a day) until you are moving your bowels 1-2 times a day.  Cut back the dose or try a different fiber product if it is giving you problems such as diarrhea or bloating. °Sometimes a laxative is needed to help jump-start bowels if constipated  until the fiber supplement can help regulate your bowels.  If you are tolerating eating & you are farting, it is okay to try a gentle laxative such as double dose MiraLax, prune juice, or Milk of Magnesia.  Avoid using laxatives too often. °Stool softeners can sometimes help counteract the constipating effects of narcotic pain medicines.  It can also cause diarrhea, so avoid using for too long. °If you are still constipated despite taking fiber daily, eating solids, and a few doses of laxatives, call our office. °Controlling diarrhea °Try drinking liquids and eating bland foods for a few days to avoid stressing your intestines further. °Avoid dairy products (especially milk & ice cream) for a short time.  The intestines often can lose the ability to digest lactose when stressed. °Avoid foods that   cause gassiness or bloating.  Typical foods include beans and other legumes, cabbage, broccoli, and dairy foods.  Avoid greasy, spicy, fast foods.  Every person has some sensitivity to other foods, so listen to your body and avoid those foods that trigger problems for you. Probiotics (such as active yogurt, Align, etc) may help repopulate the intestines and colon with normal bacteria and calm down a sensitive digestive tract Adding a fiber supplement gradually can help thicken stools by absorbing excess fluid and retrain the intestines to act more normally.  Slowly increase the dose over a few weeks.  Too much fiber too soon can backfire and cause cramping & bloating. It is okay to try and slow down diarrhea with a few doses of antidiarrheal medicines.   Bismuth subsalicylate (ex. Kayopectate, Pepto Bismol) for a few doses can help control diarrhea.  Avoid if pregnant.   Loperamide (Imodium) can slow down diarrhea.  Start with one tablet (45m) first.  Avoid if you are having fevers or severe pain.  ILEOSTOMY PATIENTS WILL HAVE CHRONIC DIARRHEA since their colon is not in use.    Drink plenty of liquids.  You will  need to drink even more glasses of water/liquid a day to avoid getting dehydrated. Record output from your ileostomy.  Expect to empty the bag every 3-4 hours at first.  Most people with a permanent ileostomy empty their bag 4-6 times at the least.   Use antidiarrheal medicine (especially Imodium) several times a day to avoid getting dehydrated.  Start with a dose at bedtime & breakfast.  Adjust up or down as needed.  Increase antidiarrheal medications as directed to avoid emptying the bag more than 8 times a day (every 3 hours). Work with your wound ostomy nurse to learn care for your ostomy.  See ostomy care instructions. TROUBLESHOOTING IRREGULAR BOWELS 1) Start with a soft & bland diet. No spicy, greasy, or fried foods.  2) Avoid gluten/wheat or dairy products from diet to see if symptoms improve. 3) Miralax 17gm or flax seed mixed in [employer] water or juice-daily. May use 2-4 times a day as needed. 4) Gas-X, Phazyme, etc. as needed for gas & bloating.  5) Prilosec (omeprazole) over-the-counter as needed 6)  Consider probiotics (Align, Activa, etc) to help calm the bowels down  Call your doctor if you are getting worse or not getting better.  Sometimes further testing (cultures, endoscopy, X-ray studies, CT scans, bloodwork, etc.) may be needed to help diagnose and treat the cause of the diarrhea. [facility] [place]  [phone] Main.    [phone] - Toll Free.   [phone] Fax [email or web address]   Colorectal Cancer Colorectal cancer is an abnormal growth of tissue (tumor) in the colon or rectum that is cancerous (malignant). Unlike noncancerous (benign) tumors, malignant tumors can spread to other parts of your body. The colon is the large bowel or large intestine. The rectum is the last several inches of the colon.  RISK FACTORS The exact cause of colorectal cancer is unknown. However, the  following factors may increase your chances of getting colorectal cancer:   Age older than 547years.   Abnormal growths (polyps) on the inner wall of the colon or rectum.   Diabetes.   African American race.   Family history of hereditary nonpolyposis colorectal cancer. This condition is caused by changes in the genes that are responsible for repairing mismatched DNA.   Personal  history of cancer. A person who has already had colorectal cancer may develop it a second time. Also, women with a history of ovarian, uterine, or breast cancer are at a somewhat higher risk of developing colorectal cancer.  Certain hereditary conditions.  Eating a diet that is high in fat (especially animal fat) and low in fiber, fruits, and vegetables.  Sedentary lifestyle.  Inflammatory bowel disease, including ulcerative colitis and Crohn's disease.   Smoking.   Excessive alcohol use.  SYMPTOMS Early colorectal cancer often does not cause symptoms. As the cancer grows, symptoms may include:   Changes in bowel habits.  Diarrhea.   Constipation.   Feeling like the bowel does not empty completely after a bowel movement.   Blood in the stool.   Stools that are narrower than usual.   Abdominal discomfort, pain, bloating, fullness, or cramps.  Frequent gas pain.   Unexplained weight loss.   Constant tiredness.   Nausea and vomiting.  DIAGNOSIS  Your health care provider will ask about your medical history. He or she may also perform a number of procedures, such as:   A physical exam.  A digital rectal exam.  A fecal occult blood test.  A barium enema.  Blood tests.   X-rays.   Imaging tests, such as CT scans or MRIs.   Taking a tissue sample (biopsy) from your colon or rectum to look for cancer cells.   A sigmoidoscopy to view the inside of the last part of your colon.   A colonoscopy to view the inside of your entire colon.   An endorectal  ultrasound to see how deep a rectal tumor has grown and whether the cancer has spread to lymph nodes or other nearby tissues.  Your cancer will be staged to determine its severity and extent. Staging is a careful attempt to find out the size of the tumor, whether the cancer has spread, and if so, to what parts of the body. You may need to have more tests to determine the stage of your cancer. The test results will help determine what treatment plan is best for you.   Stage 0. The cancer is found only in the innermost lining of the colon or rectum.   Stage I. The cancer has grown into the inner wall of the colon or rectum. The cancer has not yet reached the outer wall of the colon.   Stage II. The cancer extends more deeply into or through the wall of the colon or rectum. It may have invaded nearby tissue, but cancer cells have not spread to the lymph nodes.   Stage III. The cancer has spread to nearby lymph nodes but not to other parts of the body.   Stage IV. The cancer has spread to other parts of the body, such as the liver or lungs.  Your health care provider may tell you the detailed stage of your cancer, which includes both a number and a letter.  TREATMENT  Depending on the type and stage, colorectal cancer may be treated with surgery, radiation therapy, chemotherapy, targeted therapy, or radiofrequency ablation. Some people have a combination of these therapies. Surgery may be done to remove the polyps from your colon. In early stages, your health care provider may be able to do this during a colonoscopy. In later stages, surgery may be done to remove part of your colon.  HOME CARE INSTRUCTIONS   Take medicines only as directed by your health care provider.   Maintain a  healthy diet.   Consider joining a support group. This may help you learn to cope with the stress of having colorectal cancer.   Seek advice to help you manage treatment of side effects.   Keep all  follow-up visits as directed by your health care provider.   Inform your cancer specialist if you are admitted to the hospital.  SEEK MEDICAL CARE IF:  Your diarrhea or constipation does not go away.   Your bowel habits change.  You have increased abdominal pain.   You notice new fatigue or weakness.  You lose weight.   This information is not intended to replace advice given to you by your health care provider. Make sure you discuss any questions you have with your health care provider.   Document Released: 08/17/2005 Document Revised: 09/07/2014 Document Reviewed: 02/09/2013 Elsevier Interactive Patient Education [employer].

## 2016-07-01 NOTE — Anesthesia Procedure Notes (Signed)
Procedure Name: Intubation Date/Time: 07/01/2016 8:40 AM Performed by: Duane Boston Pre-anesthesia Checklist: Patient identified, Timeout performed, Emergency Drugs available, Suction available and Patient being monitored Patient Re-evaluated:Patient Re-evaluated prior to inductionOxygen Delivery Method: Circle system utilized Preoxygenation: Pre-oxygenation with 100% oxygen Intubation Type: IV induction and Cricoid Pressure applied Ventilation: Mask ventilation without difficulty Laryngoscope Size: Mac and 3 Grade View: Grade II Tube type: Oral Tube size: 7.5 mm Number of attempts: 1 Airway Equipment and Method: Stylet Placement Confirmation: ETT inserted through vocal cords under direct vision,  positive ETCO2 and breath sounds checked- equal and bilateral Secured at: 21 cm Tube secured with: Tape Dental Injury: Teeth and Oropharynx as per pre-operative assessment  Comments: Small scratch upper right lip present prior to induction pt. Aware.

## 2016-07-02 ENCOUNTER — Inpatient Hospital Stay (HOSPITAL_COMMUNITY): Payer: Managed Care, Other (non HMO) | Admitting: Anesthesiology

## 2016-07-02 ENCOUNTER — Encounter (HOSPITAL_COMMUNITY)
Admission: RE | Disposition: A | Discharge status: Home Health | Payer: Self-pay | Source: Ambulatory Visit | Attending: Surgery

## 2016-07-02 ENCOUNTER — Encounter (HOSPITAL_COMMUNITY): Payer: Self-pay | Admitting: Surgery

## 2016-07-02 HISTORY — PX: LAPAROSCOPIC LYSIS OF ADHESIONS: SHX5905

## 2016-07-02 HISTORY — PX: BOWEL RESECTION: SHX1257

## 2016-07-02 HISTORY — PX: APPLICATION OF WOUND VAC: SHX5189

## 2016-07-02 HISTORY — PX: LAPAROSCOPY: SHX197

## 2016-07-02 LAB — BASIC METABOLIC PANEL
ANION GAP: 8 (ref 5–15)
BUN: 18 mg/dL (ref 6–20)
CO2: 22 mmol/L (ref 22–32)
Calcium: 8.5 mg/dL — ABNORMAL LOW (ref 8.9–10.3)
Chloride: 108 mmol/L (ref 101–111)
Creatinine, Ser: 0.75 mg/dL (ref 0.44–1.00)
Glucose, Bld: 131 mg/dL — ABNORMAL HIGH (ref 65–99)
POTASSIUM: 3.8 mmol/L (ref 3.5–5.1)
SODIUM: 138 mmol/L (ref 135–145)

## 2016-07-02 LAB — CBC
HEMATOCRIT: 28.9 % — AB (ref 36.0–46.0)
HEMOGLOBIN: 9.5 g/dL — AB (ref 12.0–15.0)
MCH: 29.4 pg (ref 26.0–34.0)
MCHC: 32.9 g/dL (ref 30.0–36.0)
MCV: 89.5 fL (ref 78.0–100.0)
Platelets: 283 10*3/uL (ref 150–400)
RBC: 3.23 MIL/uL — AB (ref 3.87–5.11)
RDW: 14.2 % (ref 11.5–15.5)
WBC: 12.9 10*3/uL — AB (ref 4.0–10.5)

## 2016-07-02 LAB — SURGICAL PCR SCREEN
MRSA, PCR: NEGATIVE
STAPHYLOCOCCUS AUREUS: NEGATIVE

## 2016-07-02 LAB — MAGNESIUM: Magnesium: 1.8 mg/dL (ref 1.7–2.4)

## 2016-07-02 SURGERY — LAPAROSCOPY, DIAGNOSTIC
Anesthesia: General | Site: Abdomen

## 2016-07-02 MED ORDER — ENOXAPARIN SODIUM 40 MG/0.4ML ~~LOC~~ SOLN
40.0000 mg | SUBCUTANEOUS | Status: DC
  Administered 2016-07-03 – 2016-07-15 (×13): 40 mg via SUBCUTANEOUS
  Filled 2016-07-02 (×12): qty 0.4

## 2016-07-02 MED ORDER — SODIUM CHLORIDE 0.9% FLUSH
3.0000 mL | Freq: Two times a day (BID) | INTRAVENOUS | Status: DC
Start: 2016-07-02 — End: 2016-07-05
  Administered 2016-07-03: 3 mL via INTRAVENOUS

## 2016-07-02 MED ORDER — SODIUM CHLORIDE 0.9 % IJ SOLN
INTRAMUSCULAR | Status: AC
  Filled 2016-07-02: qty 10

## 2016-07-02 MED ORDER — SUGAMMADEX SODIUM 200 MG/2ML IV SOLN
INTRAVENOUS | Status: DC | PRN
  Administered 2016-07-02: 200 mg via INTRAVENOUS

## 2016-07-02 MED ORDER — LACTATED RINGERS IV SOLN
INTRAVENOUS | Status: DC | PRN
Start: 2016-07-02 — End: 2016-07-02
  Administered 2016-07-02 (×2): via INTRAVENOUS

## 2016-07-02 MED ORDER — METOCLOPRAMIDE HCL 5 MG/ML IJ SOLN
10.0000 mg | Freq: Once | INTRAMUSCULAR | Status: AC | PRN
  Administered 2016-07-02: 10 mg via INTRAVENOUS

## 2016-07-02 MED ORDER — METOCLOPRAMIDE HCL 5 MG/ML IJ SOLN
INTRAMUSCULAR | Status: AC
Start: 2016-07-02 — End: 2016-07-03
  Filled 2016-07-02: qty 2

## 2016-07-02 MED ORDER — METHOCARBAMOL 1000 MG/10ML IJ SOLN
1000.0000 mg | Freq: Four times a day (QID) | INTRAVENOUS | Status: DC | PRN
  Administered 2016-07-08 – 2016-07-12 (×2): 1000 mg via INTRAVENOUS
  Filled 2016-07-02 (×5): qty 10

## 2016-07-02 MED ORDER — FENTANYL CITRATE (PF) 250 MCG/5ML IJ SOLN
INTRAMUSCULAR | Status: AC
  Filled 2016-07-02: qty 5

## 2016-07-02 MED ORDER — PIPERACILLIN-TAZOBACTAM 3.375 G IVPB: 3.3750 g | Freq: Three times a day (TID) | INTRAVENOUS | Status: DC

## 2016-07-02 MED ORDER — SODIUM CHLORIDE 0.9 % IV SOLN: 250.0000 mL | INTRAVENOUS | Status: DC | PRN

## 2016-07-02 MED ORDER — LIDOCAINE 2% (20 MG/ML) 5 ML SYRINGE
INTRAMUSCULAR | Status: AC
  Filled 2016-07-02: qty 5

## 2016-07-02 MED ORDER — SODIUM CHLORIDE 0.9 % IJ SOLN
INTRAMUSCULAR | Status: AC
  Filled 2016-07-02: qty 50

## 2016-07-02 MED ORDER — FENTANYL CITRATE (PF) 100 MCG/2ML IJ SOLN
25.0000 ug | INTRAMUSCULAR | Status: DC | PRN
  Administered 2016-07-02: 50 ug via INTRAVENOUS

## 2016-07-02 MED ORDER — SODIUM CHLORIDE 0.9 % IV SOLN
INTRAVENOUS | Status: AC
  Administered 2016-07-02: 17:00:00 via INTRAPERITONEAL
  Filled 2016-07-02: qty 6

## 2016-07-02 MED ORDER — LACTATED RINGERS IV SOLN
INTRAVENOUS | Status: DC
  Administered 2016-07-02 – 2016-07-05 (×6): via INTRAVENOUS

## 2016-07-02 MED ORDER — ONDANSETRON HCL 4 MG/2ML IJ SOLN
INTRAMUSCULAR | Status: DC | PRN
  Administered 2016-07-02: 4 mg via INTRAVENOUS

## 2016-07-02 MED ORDER — MIDAZOLAM HCL 5 MG/5ML IJ SOLN
INTRAMUSCULAR | Status: DC | PRN
  Administered 2016-07-02: 2 mg via INTRAVENOUS

## 2016-07-02 MED ORDER — LACTATED RINGERS IR SOLN
Status: DC | PRN
  Administered 2016-07-02: 1000 mL

## 2016-07-02 MED ORDER — LACTATED RINGERS IV SOLN
INTRAVENOUS | Status: DC
  Administered 2016-07-02: 16:00:00 via INTRAVENOUS

## 2016-07-02 MED ORDER — LACTATED RINGERS IV BOLUS (SEPSIS): 1000.0000 mL | Freq: Three times a day (TID) | INTRAVENOUS | Status: AC | PRN

## 2016-07-02 MED ORDER — LIDOCAINE 2% (20 MG/ML) 5 ML SYRINGE
INTRAMUSCULAR | Status: DC | PRN
  Administered 2016-07-02: 50 mg via INTRAVENOUS

## 2016-07-02 MED ORDER — FENTANYL CITRATE (PF) 100 MCG/2ML IJ SOLN
INTRAMUSCULAR | Status: AC
  Filled 2016-07-02: qty 2

## 2016-07-02 MED ORDER — HYDROMORPHONE HCL 1 MG/ML IJ SOLN
0.5000 mg | INTRAMUSCULAR | Status: DC | PRN
  Administered 2016-07-02 (×2): 0.5 mg via INTRAVENOUS
  Administered 2016-07-03 – 2016-07-05 (×18): 1 mg via INTRAVENOUS
  Administered 2016-07-05: 0.5 mg via INTRAVENOUS
  Administered 2016-07-05 (×2): 1 mg via INTRAVENOUS
  Administered 2016-07-05: 0.5 mg via INTRAVENOUS
  Administered 2016-07-05: 1 mg via INTRAVENOUS
  Administered 2016-07-06 (×4): 0.5 mg via INTRAVENOUS
  Administered 2016-07-06 (×2): 1 mg via INTRAVENOUS
  Administered 2016-07-06 (×2): 0.5 mg via INTRAVENOUS
  Administered 2016-07-06 – 2016-07-07 (×2): 1 mg via INTRAVENOUS
  Administered 2016-07-07: 2 mg via INTRAVENOUS
  Administered 2016-07-07: 1 mg via INTRAVENOUS
  Administered 2016-07-07: 2 mg via INTRAVENOUS
  Administered 2016-07-07 – 2016-07-10 (×10): 1 mg via INTRAVENOUS
  Filled 2016-07-02 (×8): qty 1
  Filled 2016-07-02: qty 2
  Filled 2016-07-02 (×3): qty 1
  Filled 2016-07-02: qty 2
  Filled 2016-07-02 (×8): qty 1
  Filled 2016-07-02: qty 2
  Filled 2016-07-02 (×27): qty 1

## 2016-07-02 MED ORDER — PIPERACILLIN-TAZOBACTAM 3.375 G IVPB
3.3750 g | Freq: Three times a day (TID) | INTRAVENOUS | Status: AC
  Administered 2016-07-03 – 2016-07-07 (×15): 3.375 g via INTRAVENOUS
  Filled 2016-07-02 (×15): qty 50

## 2016-07-02 MED ORDER — METOCLOPRAMIDE HCL 5 MG/ML IJ SOLN: 5.0000 mg | Freq: Four times a day (QID) | INTRAMUSCULAR | Status: DC | PRN

## 2016-07-02 MED ORDER — BUPIVACAINE LIPOSOME 1.3 % IJ SUSP
20.0000 mL | Freq: Once | INTRAMUSCULAR | Status: DC
  Filled 2016-07-02: qty 20

## 2016-07-02 MED ORDER — PIPERACILLIN-TAZOBACTAM 3.375 G IVPB
INTRAVENOUS | Status: AC
  Filled 2016-07-02: qty 50

## 2016-07-02 MED ORDER — 0.9 % SODIUM CHLORIDE (POUR BTL) OPTIME
TOPICAL | Status: DC | PRN
  Administered 2016-07-02: 9000 mL

## 2016-07-02 MED ORDER — BUPIVACAINE HCL (PF) 0.25 % IJ SOLN
INTRAMUSCULAR | Status: AC
  Filled 2016-07-02: qty 30

## 2016-07-02 MED ORDER — BUPIVACAINE LIPOSOME 1.3 % IJ SUSP
INTRAMUSCULAR | Status: DC | PRN
  Administered 2016-07-02: 20 mL

## 2016-07-02 MED ORDER — PHENYLEPHRINE 40 MCG/ML (10ML) SYRINGE FOR IV PUSH (FOR BLOOD PRESSURE SUPPORT)
PREFILLED_SYRINGE | INTRAVENOUS | Status: AC
  Filled 2016-07-02: qty 10

## 2016-07-02 MED ORDER — PIPERACILLIN-TAZOBACTAM 3.375 G IVPB 30 MIN
3.3750 g | Freq: Once | INTRAVENOUS | Status: AC
  Administered 2016-07-02: 3.375 g via INTRAVENOUS

## 2016-07-02 MED ORDER — SODIUM CHLORIDE 0.9% FLUSH: 3.0000 mL | INTRAVENOUS | Status: DC | PRN

## 2016-07-02 MED ORDER — PROPOFOL 10 MG/ML IV BOLUS
INTRAVENOUS | Status: DC | PRN
  Administered 2016-07-02: 150 mg via INTRAVENOUS

## 2016-07-02 MED ORDER — SUCCINYLCHOLINE CHLORIDE 200 MG/10ML IV SOSY
PREFILLED_SYRINGE | INTRAVENOUS | Status: DC | PRN
  Administered 2016-07-02: 160 mg via INTRAVENOUS

## 2016-07-02 MED ORDER — SUCCINYLCHOLINE CHLORIDE 20 MG/ML IJ SOLN
INTRAMUSCULAR | Status: AC
  Filled 2016-07-02: qty 1

## 2016-07-02 MED ORDER — MEPERIDINE HCL 50 MG/ML IJ SOLN: 6.2500 mg | INTRAMUSCULAR | Status: DC | PRN

## 2016-07-02 MED ORDER — LACTATED RINGERS IV SOLN
INTRAVENOUS | Status: DC
  Administered 2016-07-02: 18:00:00 via INTRAVENOUS

## 2016-07-02 MED ORDER — ONDANSETRON HCL 4 MG/2ML IJ SOLN
INTRAMUSCULAR | Status: AC
  Filled 2016-07-02: qty 2

## 2016-07-02 MED ORDER — PROMETHAZINE HCL 25 MG/ML IJ SOLN
INTRAMUSCULAR | Status: AC
  Administered 2016-07-02: 6.25 mg via INTRAVENOUS
  Filled 2016-07-02: qty 1

## 2016-07-02 MED ORDER — SUGAMMADEX SODIUM 200 MG/2ML IV SOLN
INTRAVENOUS | Status: AC
  Filled 2016-07-02: qty 2

## 2016-07-02 MED ORDER — CHLORHEXIDINE GLUCONATE CLOTH 2 % EX PADS: 6.0000 | MEDICATED_PAD | Freq: Once | CUTANEOUS | Status: DC

## 2016-07-02 MED ORDER — PROMETHAZINE HCL 25 MG/ML IJ SOLN
6.2500 mg | Freq: Once | INTRAMUSCULAR | Status: AC
  Administered 2016-07-02: 6.25 mg via INTRAVENOUS

## 2016-07-02 MED ORDER — MAGIC MOUTHWASH
15.0000 mL | Freq: Four times a day (QID) | ORAL | Status: DC | PRN
  Filled 2016-07-02: qty 15

## 2016-07-02 MED ORDER — FENTANYL CITRATE (PF) 100 MCG/2ML IJ SOLN
INTRAMUSCULAR | Status: DC | PRN
  Administered 2016-07-02 (×5): 50 ug via INTRAVENOUS
  Administered 2016-07-02: 100 ug via INTRAVENOUS
  Administered 2016-07-02 (×5): 50 ug via INTRAVENOUS

## 2016-07-02 MED ORDER — PHENYLEPHRINE HCL 10 MG/ML IJ SOLN
INTRAMUSCULAR | Status: DC | PRN
  Administered 2016-07-02: 40 ug via INTRAVENOUS
  Administered 2016-07-02: 80 ug via INTRAVENOUS
  Administered 2016-07-02: 100 ug via INTRAVENOUS

## 2016-07-02 MED ORDER — PROPOFOL 10 MG/ML IV BOLUS
INTRAVENOUS | Status: AC
  Filled 2016-07-02: qty 20

## 2016-07-02 MED ORDER — SODIUM CHLORIDE 0.9 % IJ SOLN
INTRAMUSCULAR | Status: DC | PRN
  Administered 2016-07-02: 60 mL via INTRAVENOUS

## 2016-07-02 MED ORDER — ROCURONIUM BROMIDE 10 MG/ML (PF) SYRINGE
PREFILLED_SYRINGE | INTRAVENOUS | Status: DC | PRN
  Administered 2016-07-02: 50 mg via INTRAVENOUS

## 2016-07-02 MED ORDER — POLYETHYLENE GLYCOL 3350 17 GM/SCOOP PO POWD
1.0000 | Freq: Once | ORAL | Status: DC
  Filled 2016-07-02: qty 255

## 2016-07-02 MED ORDER — MIDAZOLAM HCL 2 MG/2ML IJ SOLN
INTRAMUSCULAR | Status: AC
  Filled 2016-07-02: qty 2

## 2016-07-02 MED ORDER — ROCURONIUM BROMIDE 50 MG/5ML IV SOSY
PREFILLED_SYRINGE | INTRAVENOUS | Status: AC
  Filled 2016-07-02: qty 5

## 2016-07-02 SURGICAL SUPPLY — 53 items
CABLE HIGH FREQUENCY MONO STRZ (ELECTRODE) ×3 IMPLANT
COVER SURGICAL LIGHT HANDLE (MISCELLANEOUS) ×3 IMPLANT
DECANTER SPIKE VIAL GLASS SM (MISCELLANEOUS) ×2 IMPLANT
DRAIN CHANNEL 19F RND (DRAIN) ×1 IMPLANT
DRAPE UTILITY XL STRL (DRAPES) ×3 IMPLANT
DRAPE WARM FLUID 44X44 (DRAPE) ×3 IMPLANT
DRSG TEGADERM 2-3/8X2-3/4 SM (GAUZE/BANDAGES/DRESSINGS) ×1 IMPLANT
DRSG TEGADERM 4X4.75 (GAUZE/BANDAGES/DRESSINGS) IMPLANT
DRSG VAC ATS MED SENSATRAC (GAUZE/BANDAGES/DRESSINGS) ×1 IMPLANT
ELECT PENCIL ROCKER SW 15FT (MISCELLANEOUS) ×1 IMPLANT
ELECT REM PT RETURN 9FT ADLT (ELECTROSURGICAL) ×3
ELECTRODE REM PT RTRN 9FT ADLT (ELECTROSURGICAL) ×2 IMPLANT
EVACUATOR SILICONE 100CC (DRAIN) ×1 IMPLANT
GAUZE SPONGE 2X2 8PLY STRL LF (GAUZE/BANDAGES/DRESSINGS) ×2 IMPLANT
GLOVE ECLIPSE 8.0 STRL XLNG CF (GLOVE) ×3 IMPLANT
GLOVE INDICATOR 8.0 STRL GRN (GLOVE) ×3 IMPLANT
GOWN STRL REUS W/TWL XL LVL3 (GOWN DISPOSABLE) ×9 IMPLANT
IRRIG SUCT STRYKERFLOW 2 WTIP (MISCELLANEOUS) ×3
IRRIGATION SUCT STRKRFLW 2 WTP (MISCELLANEOUS) ×2 IMPLANT
KIT BASIN OR (CUSTOM PROCEDURE TRAY) ×3 IMPLANT
PAD POSITIONING PINK XL (MISCELLANEOUS) ×3 IMPLANT
POSITIONER SURGICAL ARM (MISCELLANEOUS) ×1 IMPLANT
RETRACTOR WND ALEXIS 25 LRG (MISCELLANEOUS) IMPLANT
RTRCTR WOUND ALEXIS 25CM LRG (MISCELLANEOUS) ×3
SCISSORS LAP 5X35 DISP (ENDOMECHANICALS) ×3 IMPLANT
SEALER TISSUE G2 CVD JAW 35 (ENDOMECHANICALS) IMPLANT
SEALER TISSUE G2 CVD JAW 45CM (ENDOMECHANICALS)
SEALER TISSUE G2 STRG ARTC 35C (ENDOMECHANICALS) IMPLANT
SLEEVE XCEL OPT CAN 5 100 (ENDOMECHANICALS) ×6 IMPLANT
SPONGE GAUZE 2X2 STER 10/PKG (GAUZE/BANDAGES/DRESSINGS) ×1
SPONGE LAP 18X18 X RAY DECT (DISPOSABLE) ×1 IMPLANT
STAPLER 90 3.5 STAND SLIM (STAPLE) ×3
STAPLER 90 3.5 STD SLIM (STAPLE) IMPLANT
STAPLER PROXIMATE 75MM BLUE (STAPLE) ×1 IMPLANT
SUCTION POOLE TIP (SUCTIONS) ×1 IMPLANT
SUT MNCRL AB 4-0 PS2 18 (SUTURE) ×2 IMPLANT
SUT PDS AB 1 TP1 96 (SUTURE) ×2 IMPLANT
SUT PROLENE 2 0 SH DA (SUTURE) ×1 IMPLANT
SUT SILK 2 0 (SUTURE) ×3
SUT SILK 2 0 SH CR/8 (SUTURE) ×3 IMPLANT
SUT SILK 2-0 18XBRD TIE 12 (SUTURE) ×2 IMPLANT
SUT SILK 3 0 (SUTURE) ×3
SUT SILK 3 0 SH CR/8 (SUTURE) ×3 IMPLANT
SUT SILK 3-0 18XBRD TIE 12 (SUTURE) ×2 IMPLANT
SYR BULB IRRIGATION 50ML (SYRINGE) ×1 IMPLANT
TOWEL OR 17X26 10 PK STRL BLUE (TOWEL DISPOSABLE) ×4 IMPLANT
TOWEL OR NON WOVEN STRL DISP B (DISPOSABLE) ×3 IMPLANT
TRAY FOLEY W/METER SILVER 16FR (SET/KITS/TRAYS/PACK) IMPLANT
TRAY LAPAROSCOPIC (CUSTOM PROCEDURE TRAY) ×3 IMPLANT
TROCAR BLADELESS OPT 5 100 (ENDOMECHANICALS) ×3 IMPLANT
TROCAR XCEL NON-BLD 11X100MML (ENDOMECHANICALS) IMPLANT
TUBING INSUF HEATED (TUBING) ×3 IMPLANT
YANKAUER SUCT BULB TIP 10FT TU (MISCELLANEOUS) ×1 IMPLANT

## 2016-07-02 NOTE — Progress Notes (Signed)
New dressing RUQ continues to be dry. Dr Johney Maine office triage nurse- April notified of drainage and states will give message to Dr Johney Maine.

## 2016-07-02 NOTE — Anesthesia Postprocedure Evaluation (Signed)
Anesthesia Post Note  Patient: [name]  Procedure(s) Performed: Procedure(s) (LRB): LAPAROSCOPY DIAGNOSTIC, LAPAROSCOPIC LYSIS OF ADHESIONS, SEROSAL REPAIR SMALL BOWEL RESECTION, OMENTOPEXY APPLICATION OF WOUND VAC (N/A) LAPAROSCOPIC LYSIS OF ADHESIONS SMALL BOWEL RESECTION APPLICATION OF WOUND VAC  Patient location during evaluation: PACU Anesthesia Type: General Level of consciousness: awake and alert Pain management: pain level controlled Vital Signs Assessment: post-procedure vital signs reviewed and stable Respiratory status: spontaneous breathing, nonlabored ventilation, respiratory function stable and patient connected to nasal cannula oxygen Cardiovascular status: blood pressure returned to baseline and stable Postop Assessment: no signs of nausea or vomiting Anesthetic complications: no    Last Vitals:  Vitals:   07/02/16 1815 07/02/16 1836  BP:  139/83  Pulse:  86  Resp:  16  Temp: 36.4 C 36.7 C    Last Pain:  Vitals:   07/02/16 1815  TempSrc:   PainSc: 0-No pain                 Montez Hageman

## 2016-07-02 NOTE — Consult Note (Signed)
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  Left supplies at bedside for bag change tomorrow.   Enrolled patient in [employer] program: Yes Lenard Simmer WOC student [place] team will continue to follow and remain available to patient, nurses, and medical team.  Domenic Moras RN BSN [facility] Pager [phone]

## 2016-07-02 NOTE — Anesthesia Preprocedure Evaluation (Signed)
Anesthesia Evaluation  Patient identified by MRN, date of birth, ID band Patient awake    Reviewed: Allergy & Precautions, NPO status , Patient's Chart, lab work & pertinent test results  History of Anesthesia Complications Negative for: history of anesthetic complications  Airway Mallampati: II  TM Distance: >3 FB Neck ROM: Full    Dental no notable dental hx. (+) Dental Advisory Given   Pulmonary neg pulmonary ROS,    Pulmonary exam normal        Cardiovascular negative cardio ROS Normal cardiovascular exam     Neuro/Psych negative neurological ROS  negative psych ROS   GI/Hepatic Neg liver ROS, GERD  ,  Endo/Other  negative endocrine ROS  Renal/GU Renal disease  negative genitourinary   Musculoskeletal negative musculoskeletal ROS (+)   Abdominal   Peds negative pediatric ROS (+)  Hematology negative hematology ROS (+)   Anesthesia Other Findings   Reproductive/Obstetrics negative OB ROS                             Anesthesia Physical  Anesthesia Plan  ASA: II  Anesthesia Plan: General   Post-op Pain Management:    Induction: Intravenous, Rapid sequence and Cricoid pressure planned  Airway Management Planned: Oral ETT  Additional Equipment:   Intra-op Plan:   Post-operative Plan: Extubation in OR  Informed Consent: I have reviewed the patients History and Physical, chart, labs and discussed the procedure including the risks, benefits and alternatives for the proposed anesthesia with the patient or authorized representative who has indicated his/her understanding and acceptance.   Dental advisory given  Plan Discussed with: CRNA and Anesthesiologist  Anesthesia Plan Comments:         Anesthesia Quick Evaluation

## 2016-07-02 NOTE — Progress Notes (Signed)
See Brayton Layman Baldwin's previous note.  Changed abd pad over port RUQ. Drainage greenish brown . Appears to leak when patient mobile. Dr Johney Maine notified.
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
## 2016-07-02 NOTE — Progress Notes (Signed)
[address] [place], [place] [address] Phone: [phone] FAX: [place] [phone] 1956/08/04  CARE TEAM:  PCP: [name], NP  Outpatient Care Team: Patient Care Team: [name], NP as PCP - General (Obstetrics and Gynecology) [name], MD as Consulting Physician (General Surgery) [name], MD as Consulting Physician (Gastroenterology) [name], MD as Consulting Physician (Gynecology) Tania Ade, RN as Registered Nurse  Inpatient Treatment Team: Treatment Team: Attending Provider: [name], MD; Registered Nurse: Mortimer Fries, RN  Problem List:   Principal Problem:   Rectal cancer s/p redo robotic LAR with coloanal anastomosis 07/01/2016 Active Problems:   Ovarian cyst s/p BSO 07/01/2016   Family history of ovarian cancer s/p BSO 07/01/2016   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   Uterine fibromyoma   Obesity   1 Day Post-Op  07/01/2016  POST-OPERATIVE DIAGNOSIS:    RECTAL CANCER LEFT OVARIAN MASS STRONG FAMILY HISTORY OF OVARIAN CANCER  PROCEDURE:  : XI ROBOTIC ASSISTED REDO LOWER ANTERIOR  RECTO-SIGMOID RESECTION  COLOANAL Handsewn ANASTOMOSIS  SPLENIC FLEXURE MOBILIZATION BILATERAL SALPINGO-OOPHORECTOMY LAPAROSCOPIC & ROBOTIC LYSIS OF ADHESIONS X 2 HOURS (33% of case) DIVERTING LOOP ILEOSTOMY Cystoscopy with placement of bilateral lighted ureteral stents    SURGEON:  [name], MD   Assessment  Stabilizing  Plan:  -adv to fulls/pureed -wean IVF -pain control -f/u pathology Ostomy care -VTE prophylaxis- SCDs, etc -mobilize as tolerated to help recovery  [name], M.D., F.A.C.S. Gastrointestinal and Minimally Invasive Surgery [facility] [address] [place], [place] [address] [phone] Main / Paging   07/02/2016  Subjective:  Sore - pain meds helping Stood up [place]  in room  Objective:  Vital signs:  Vitals:   07/01/16 2120 07/02/16 0156 07/02/16 0434 07/02/16 0500  BP: 127/70 113/61 136/70   Pulse: 63 66 65   Resp: 16 16 16    Temp: 98 F (36.7 C) 97.7 F (36.5 C) 97.9 F (36.6 C)   TempSrc: Axillary Oral Axillary   SpO2: 100% 96% 99%   Weight:    89.9 kg (198 lb 3.1 oz)  Height:           Intake/Output   Yesterday:  11/01 0701 - 11/02 0700 In: 3732.5 [I.V.:3162.5; IV Piggyback:500] Out: 2005 [Urine:665; Drains:890; Blood:250] This shift:  Total I/O In: 432.5 [I.V.:362.5; Other:70] Out: 705 [Urine:450; Drains:255]  Bowel function:  Flatus: YES  BM:  No  Drain: Serosanguinous   Physical Exam:  General: Pt awake/alert/oriented x4 in mild acute distress Eyes: PERRL, normal EOM.  Sclera clear.  No icterus Neuro: CN II-XII intact w/o focal sensory/motor deficits. Lymph: No head/neck/groin lymphadenopathy Psych:  No delerium/psychosis/paranoia HENT: Normocephalic, Mucus membranes moist.  No thrush Neck: Supple, No tracheal deviation Chest: No chest wall pain w good excursion CV:  Pulses intact.  Regular rhythm MS: Normal AROM mjr joints.  No obvious deformity Abdomen: Soft.  Mildy distended.  Mildly tender at incisions only.  No evidence of peritonitis.  No incarcerated hernias. Ext:  SCDs BLE.  No mjr edema.  No cyanosis Skin: No petechiae / purpura  Results:   Labs: Results for orders placed or performed during the hospital encounter of 07/01/16 (from the past 48 hour(s))  Basic metabolic panel     Status: Abnormal   Collection Time: 07/02/16  4:24 AM  Result Value Ref Range   Sodium 138 135 - 145 mmol/L  Potassium 3.8 3.5 - 5.1 mmol/L   Chloride 108 101 - 111 mmol/L   CO2 22 22 - 32 mmol/L   Glucose, Bld 131 (H) 65 - 99 mg/dL   BUN 18 6 - 20 mg/dL   Creatinine, Ser 0.75 0.44 - 1.00 mg/dL   Calcium 8.5 (L) 8.9 - 10.3 mg/dL   GFR calc non Af Amer >60 >60 mL/min   GFR calc Af Amer >60 >60 mL/min    Comment:  (NOTE) The eGFR has been calculated using the CKD EPI equation. This calculation has not been validated in all clinical situations. eGFR's persistently <60 mL/min signify possible Chronic Kidney Disease.    Anion gap 8 5 - 15  CBC     Status: Abnormal   Collection Time: 07/02/16  4:24 AM  Result Value Ref Range   WBC 12.9 (H) 4.0 - 10.5 K/uL   RBC 3.23 (L) 3.87 - 5.11 MIL/uL   Hemoglobin 9.5 (L) 12.0 - 15.0 g/dL   HCT 28.9 (L) 36.0 - 46.0 %   MCV 89.5 78.0 - 100.0 fL   MCH 29.4 26.0 - 34.0 pg   MCHC 32.9 30.0 - 36.0 g/dL   RDW 14.2 11.5 - 15.5 %   Platelets 283 150 - 400 K/uL  Magnesium     Status: None   Collection Time: 07/02/16  4:24 AM  Result Value Ref Range   Magnesium 1.8 1.7 - 2.4 mg/dL    Imaging / Studies: Dg C-arm 1-60 Min-no Report  Result Date: 07/01/2016 CLINICAL DATA: surgery C-ARM 1-60 MINUTES Fluoroscopy was utilized by the requesting physician.  No radiographic interpretation.    Medications / Allergies: per chart  Antibiotics: Anti-infectives    Start     Dose/Rate Route Frequency Ordered Stop   07/02/16 1000  clindamycin (CLEOCIN) 900 mg, gentamicin (GARAMYCIN) 240 mg in sodium chloride 0.9 % 1,000 mL for intraperitoneal lavage  Status:  Discontinued      Intraperitoneal To Surgery 06/30/16 1345 06/30/16 1347   07/01/16 2200  cefoTEtan (CEFOTAN) 2 g in dextrose 5 % 50 mL IVPB     2 g 100 mL/hr over 30 Minutes Intravenous Every 12 hours 07/01/16 1804 07/01/16 2210   07/01/16 1024  clindamycin (CLEOCIN) 900 mg, gentamicin (GARAMYCIN) 240 mg in sodium chloride 0.9 % 1,000 mL for intraperitoneal lavage  Status:  Discontinued       As needed 07/01/16 1025 07/01/16 1605   07/01/16 0700  clindamycin (CLEOCIN) 900 mg, gentamicin (GARAMYCIN) 240 mg in sodium chloride 0.9 % 1,000 mL for intraperitoneal lavage  Status:  Discontinued      Intraperitoneal To Surgery 06/30/16 1334 06/30/16 1344   07/01/16 0700  clindamycin (CLEOCIN) 900 mg, gentamicin (GARAMYCIN)  240 mg in sodium chloride 0.9 % 1,000 mL for intraperitoneal lavage  Status:  Discontinued      Intraperitoneal To Surgery 06/30/16 1347 07/01/16 1741   07/01/16 0649  cefoTEtan (CEFOTAN) 2 g in dextrose 5 % 50 mL IVPB     2 g 100 mL/hr over 30 Minutes Intravenous On call to O.R. 07/01/16 2482 07/01/16 0835   07/01/16 0649  neomycin (MYCIFRADIN) tablet 1,000 mg  Status:  Discontinued     1,000 mg Oral 3 times per day 07/01/16 0650 07/01/16 0713   07/01/16 0649  metroNIDAZOLE (FLAGYL) tablet 1,000 mg  Status:  Discontinued     1,000 mg Oral 3 times per day 07/01/16 0650 07/01/16 0713        Note: Portions of this  report may have been transcribed using voice recognition software. Every effort was made to ensure accuracy; however, inadvertent computerized transcription errors may be present.   Any transcriptional errors that result from this process are unintentional.     [name], M.D., F.A.C.S. Gastrointestinal and Minimally Invasive Surgery [facility] [address] [place], [place] [address] [phone] Main / Paging   07/02/2016

## 2016-07-02 NOTE — Op Note (Signed)
07/02/2016  5:38 PM  PATIENT:  [name]  60 y.o. female  Patient Care Team: [name], NP as PCP - General (Obstetrics and Gynecology) [name], MD as Consulting Physician (General Surgery) [name], MD as Consulting Physician (Gastroenterology) [name], MD as Consulting Physician (Gynecology) Tania Ade, RN as Registered Nurse  PRE-OPERATIVE DIAGNOSIS:  Bilious drainage, probable leak  POST-OPERATIVE DIAGNOSIS:  DELAYED SMALL BOWEL PERFORATION  PROCEDURE:   LAPAROSCOPY DIAGNOSTIC SMALL BOWEL RESECTION SEROSAL REPAIR SMALL BOWEL  OMENTOPEXY  Surgeon(s): [name], MD  ASSISTANT: RN   ANESTHESIA:   local and general  EBL:  Total I/O In: 1000 [I.V.:1000] Out: 43 [Urine:650; Drains:60; Other:200; Blood:50]  Delay start of Pharmacological VTE agent (>24hrs) due to surgical blood loss or risk of bleeding:  no  DRAINS:   RLQ drain remains in pelvis New LUQ drain heads over SB repair & SB resection & into retrohepatic space  SPECIMEN:  Jejunum & old mesh  DISPOSITION OF SPECIMEN:  PATHOLOGY  COUNTS:  YES  PLAN OF CARE: Admit to inpatient   PATIENT DISPOSITION:  PACU - hemodynamically stable.  INDICATION: Patient with prior low anterior resection requiring robotic lysis adhesions and redo low anterior resection for newly diagnosed cancer.  Had had prior ventral hernia repair with mesh and dense adhesions of small bowel to the mesh.  Once of injury yesterday.  Felt good this morning.  This afternoon began to have some abdominal discomfort and started having bilious drainage from one of the port sites.  I recommended emergent laparoscopic probable open exploration.  Technically crease benefits alternatives discussed.  Questions answered and she and the family agreed to see.  OR FINDINGS: Bilious contamination right upper quadrant at area of dense small bowel adhesions to prior mesh consistent with delayed perforation of probable small  serosal injury.  Ileostomy intact with no ischemia or perforation..  Lower abdomen including the anterior resection and pelvis intact without ischemia and perforation.  DESCRIPTION:   Informed consent was confirmed.  The patient underwent general anaesthesia without difficulty.  The patient was positioned appropriately.  VTE prevention in place.  The patient's abdomen was clipped, prepped, & draped in a sterile fashion.  Surgical timeout confirmed our plan.  The patient was positioned in reverse Trendelenburg.  Abdominal entry with a 18mm laparoscopic port was gained using optical entry technique in the left upper abdomen or prior port site..  Entry was clean.  I induced carbon dioxide insufflation.  Camera inspection revealed no injury.  Placed another port in the left midabdomen.    I could see a small pocket of bile in the right upper quadrant.  I did lyse lesions to free the greater omentum off the right upper quadrant and free the omentum off the small bowel.  Camera inspection revealed that the ileostomy was pink and healthy without any perforation or contamination.  The left colon was clean and intact the pelvis was fine.  Drain in place.  I did an upper midline incision and came through the fascia.  I placed a large wound protector.  I eviscerated small bowel and followed until I found a dense cluster of small bowel and prior mesh. I could see a small perforation of small bowel and an area of dense full's and interloop adhesions.  Carefully most small bowel and freed off some interloop adhesions including through some of the mesh.  This straightened out most kinked off area.  There were regions of dense adhesions of  mesh to small bowel serosa and a few straightened paths that were not kinked or twisted..  I left those alone to avoid further injury.  Saw small area of thinning of the serosa that I patched with interrupted silk suture to good result.  The enterotomy showed poor tissues and it was  near mesh.  Decided to resect small bowel.  I did a side-to-side anastomosis and then resected off the perforation and some of the mesh.  8 cm segment.   I did not one to do more aggressive bowel resection given the fact she had a loop ileostomy and I suspected I would have to remove several feet of small bowel to get all mesh opposed bowel out.   I did copious irrigation of 5 L in all areas and regions.  Pelvis left upper quadrant was clear.  Right upper quadrant cleaned up well.  I mobilized greater omentum and laid that over the anastomosis and sutured over it as an omentopexy using interrupted silk suture to good result.  Did further irrigation with a few more liters and then a final irrigation of antibiotic solution of clindamycin gentamicin.  Placed a drain through the new left-sided port site.  I laid that over the serosal repair anastomosis and had the tail going down Morison's pouch in the right lower quadrant and the retrohepatic space since that is where the bile seemed to track.  I freed greater omentum off some adhesions and laid had over the drain.  I closed the midline incision using #1 running PDS.  Copiously irrigated the wound.  I closed the left upper quadrant robotic 57mm port site with Monocryl.  The right upper quadrant 56mm port site that had been draining bilious fluid was left open and placed a umbilical wick in, having the tail go towards the midline incision.  I placed a sponge in the wound & placed a wound VAC in place that over the wound & wick.  Sterile dressings reapplied to the right lower quadrant drain and right lower quadrant closed robotic port site.  New ileostomy pouch placed.  Patient being extubated.  She was medically stable.  I would like to watch her in the stepdown to be safe. I discussed operative findings, updated the patient's status, discussed probable steps to recovery, and gave postoperative recommendations to the patient's family.  Recommendations were made.   Questions were answered.  They expressed understanding & appreciation.     [name], M.D., F.A.C.S. Gastrointestinal and Minimally Invasive Surgery [facility] [address] [place], [place] [address] [phone] Main / Paging               .

## 2016-07-02 NOTE — Progress Notes (Signed)
Called into room by tech. Patient had moderate amount of brownish/green colored drainage coming from right mid port site. Gauze changed and placed with ABD pad and tape. Primary nurse notified and will continue to monitor. Loop ileostomy intact, red stoma. Abdomen distended.   JP drain with bloody drainage, to suction.

## 2016-07-02 NOTE — Anesthesia Procedure Notes (Signed)
Procedure Name: Intubation Date/Time: 07/02/2016 3:41 PM Performed by: Anne Fu Pre-anesthesia Checklist: Patient identified, Emergency Drugs available, Suction available, Patient being monitored and Timeout performed Patient Re-evaluated:Patient Re-evaluated prior to inductionOxygen Delivery Method: Circle system utilized Preoxygenation: Pre-oxygenation with 100% oxygen Intubation Type: IV induction Ventilation: Mask ventilation without difficulty Laryngoscope Size: Mac and 4 Grade View: Grade I Tube type: Oral Tube size: 7.5 mm Number of attempts: 1 Airway Equipment and Method: Stylet Placement Confirmation: ETT inserted through vocal cords under direct vision,  positive ETCO2,  CO2 detector and breath sounds checked- equal and bilateral Secured at: 20 cm Tube secured with: Tape Dental Injury: Teeth and Oropharynx as per pre-operative assessment

## 2016-07-02 NOTE — Progress Notes (Signed)
2 by 2 dressing on ruq saturated with brownish  Liquid while up moving. Removed and continued to ooze brown -orange liquid . Clean dressing applied-and that 2x2 staurated also.   Dressing remained clean thereafter.

## 2016-07-02 NOTE — Progress Notes (Addendum)
Pharmacy Antibiotic Note  [name] is a 60 y.o. female admitted on 07/01/2016 with peritonitis.  Pharmacy has been consulted for Zosyn x 5 days dosing.  Plan: Zosyn 3.375g IV q8h (4 hour infusion).  Renal function not likely to worsen Will sign off  Height: 5\' 3"  (160 cm) Weight: 198 lb 3.1 oz (89.9 kg) IBW/kg (Calculated) : 52.4  Temp (24hrs), Avg:97.8 F (36.6 C), Min:97.5 F (36.4 C), Max:98.1 F (36.7 C)   Recent Labs Lab 06/29/16 1448 07/02/16 0424  WBC 8.8 12.9*  CREATININE 0.69 0.75    Estimated Creatinine Clearance: 80.6 mL/min (by C-G formula based on SCr of 0.75 mg/dL).    Allergies  Allergen Reactions  . Nitrofurantoin Monohyd Macro     Throat swelling   . Iodinated Diagnostic Agents Hives  . Metronidazole Nausea Only    05/30/16 intolerance  . Other Other (See Comments)    Oral CT contrast-chalky on 06-01-16 Face turned red, hot, dizzy     Thank you for allowing pharmacy to be a part of this patient's care.   Adrian Saran, PharmD, BCPS Pager [phone] 07/02/2016 7:08 PM

## 2016-07-02 NOTE — Transfer of Care (Signed)
Immediate Anesthesia Transfer of Care Note  Patient: [name]  Procedure(s) Performed: Procedure(s): LAPAROSCOPY DIAGNOSTIC, LAPAROSCOPIC LYSIS OF ADHESIONS, SEROSAL REPAIR SMALL BOWEL RESECTION, OMENTOPEXY (N/A) LAPAROSCOPIC LYSIS OF ADHESIONS SMALL BOWEL RESECTION  Patient Location: PACU  Anesthesia Type:General  Level of Consciousness: sedated  Airway & Oxygen Therapy: Patient Spontanous Breathing and Patient connected to face mask oxygen  Post-op Assessment: Report given to RN and Post -op Vital signs reviewed and stable  Post vital signs: Reviewed and stable  Last Vitals:  Vitals:   07/02/16 1048 07/02/16 1421  BP: 125/67 130/69  Pulse: 83 76  Resp: 16 16  Temp: 36.4 C 36.4 C    Last Pain:  Vitals:   07/02/16 1421  TempSrc: Oral  PainSc:       Patients Stated Pain Goal: 3 ([id number])  Complications: No apparent anesthesia complications

## 2016-07-03 ENCOUNTER — Encounter (HOSPITAL_COMMUNITY): Payer: Self-pay | Admitting: Surgery

## 2016-07-03 LAB — CBC
HEMATOCRIT: 26.4 % — AB (ref 36.0–46.0)
Hemoglobin: 8.8 g/dL — ABNORMAL LOW (ref 12.0–15.0)
MCH: 29.6 pg (ref 26.0–34.0)
MCHC: 33.3 g/dL (ref 30.0–36.0)
MCV: 88.9 fL (ref 78.0–100.0)
PLATELETS: 292 10*3/uL (ref 150–400)
RBC: 2.97 MIL/uL — AB (ref 3.87–5.11)
RDW: 14.6 % (ref 11.5–15.5)
WBC: 15.5 10*3/uL — ABNORMAL HIGH (ref 4.0–10.5)

## 2016-07-03 LAB — BASIC METABOLIC PANEL
Anion gap: 5 (ref 5–15)
BUN: 16 mg/dL (ref 6–20)
CHLORIDE: 107 mmol/L (ref 101–111)
CO2: 25 mmol/L (ref 22–32)
CREATININE: 0.7 mg/dL (ref 0.44–1.00)
Calcium: 7.9 mg/dL — ABNORMAL LOW (ref 8.9–10.3)
GFR calc Af Amer: >60 mL/min (ref >60)
GFR calc non Af Amer: >60 mL/min (ref >60)
Glucose, Bld: 138 mg/dL — ABNORMAL HIGH (ref 65–99)
POTASSIUM: 3.6 mmol/L (ref 3.5–5.1)
Sodium: 137 mmol/L (ref 135–145)

## 2016-07-03 LAB — MAGNESIUM: Magnesium: 1.7 mg/dL (ref 1.7–2.4)

## 2016-07-03 MED ORDER — MAGNESIUM SULFATE 2 GM/50ML IV SOLN: 2.0000 g | Freq: Once | INTRAVENOUS | Status: DC

## 2016-07-03 MED ORDER — LACTATED RINGERS IV BOLUS (SEPSIS)
1000.0000 mL | Freq: Once | INTRAVENOUS | Status: AC
  Administered 2016-07-03: 1000 mL via INTRAVENOUS

## 2016-07-03 MED ORDER — METOPROLOL TARTRATE 5 MG/5ML IV SOLN
2.5000 mg | Freq: Three times a day (TID) | INTRAVENOUS | Status: DC
  Administered 2016-07-03 – 2016-07-15 (×35): 2.5 mg via INTRAVENOUS
  Filled 2016-07-03 (×35): qty 5

## 2016-07-03 NOTE — Progress Notes (Signed)
[name][address] [place], [place] [address] Phone: [phone] FAX: [phone]   [name] [id number] 17-Jan-1956  CARE TEAM:  PCP: [name], NP  Outpatient Care Team: Patient Care Team: [name], NP as PCP - General (Obstetrics and Gynecology) [name], MD as Consulting Physician (General Surgery) [name], MD as Consulting Physician (Gastroenterology) [name], MD as Consulting Physician (Gynecology) Tania Ade, RN as Registered Nurse  Inpatient Treatment Team: Treatment Team: Attending Provider: [name], MD; Technician: Sueanne Margarita, NT; Registered Nurse: Lynetta Mare, RN; Registered Nurse: Mortimer Fries, RN  Problem List:   Principal Problem:   Rectal cancer s/p redo robotic LAR with coloanal anastomosis 07/01/2016 Active Problems:   Ovarian cyst s/p BSO 07/01/2016   Family history of ovarian cancer s/p BSO 07/01/2016   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   Uterine fibromyoma   Obesity  SURGERY 07/01/2016  POST-OPERATIVE DIAGNOSIS:    RECTAL CANCER LEFT OVARIAN MASS STRONG FAMILY HISTORY OF OVARIAN CANCER  PROCEDURE:  : XI ROBOTIC ASSISTED REDO LOWER ANTERIOR  RECTO-SIGMOID RESECTION  COLOANAL Handsewn ANASTOMOSIS  SPLENIC FLEXURE MOBILIZATION BILATERAL SALPINGO-OOPHORECTOMY LAPAROSCOPIC & ROBOTIC LYSIS OF ADHESIONS X 2 HOURS (33% of case) DIVERTING LOOP ILEOSTOMY Cystoscopy with placement of bilateral lighted ureteral stents    SURGEON:  [name], MD  1 Day Post-Op  07/02/2016  POST-OPERATIVE DIAGNOSIS:  DELAYED SMALL BOWEL PERFORATION  PROCEDURE:   LAPAROSCOPY DIAGNOSTIC SMALL BOWEL RESECTION SEROSAL REPAIR SMALL BOWEL  OMENTOPEXY  Surgeon(s): [name], MD  Assessment  Stabilizing  Plan:  -NGT/NPO until flatus, then clamping trials -ext drainage -IV ABx x 5 days -IVF -pain control -f/u pathology Ostomy care -VTE  prophylaxis- SCDs, etc -mobilize as tolerated to help recovery -transfer to floor later today  I updated the patient's status to the patient.  OR findings & Tx noted.  Recommendations were made.  Questions were answered.  She expressed understanding & appreciation.   [name], M.D., F.A.C.S. Gastrointestinal and Minimally Invasive Surgery [facility] [address] [place],  [address] [phone] Main / Paging   07/03/2016  Subjective:  Sore - pain meds helping Inc HR w pain - better now   Objective:  Vital signs:  Vitals:   07/02/16 2000 07/02/16 2348 07/03/16 0419 07/03/16 0425  BP: (!) 150/63   123/78  Pulse: (!) 108   (!) 119  Resp: 16   17  Temp:  98.1 F (36.7 C) 98.3 F (36.8 C)   TempSrc:  Oral Oral   SpO2: 97%   98%  Weight:      Height:        Last BM Date: 06/30/16 (pre op)  Intake/Output   Yesterday:  11/02 0701 - 11/03 0700 In: 2850 [I.V.:2800; IV Piggyback:50] Out: 1856 [Urine:960; Emesis/NG output:200; Drains:260; Blood:50] This shift:  Total I/O In: 850 [I.V.:800; IV Piggyback:50] Out: 55 [Urine:270; Emesis/NG output:200; Drains:140]  Bowel function:  Flatus: No  BM:  No  RLQ drain (pelvic):Serosanguinous  LUQ drain (to RUQ) - serous   Physical Exam:  General: Pt awake/alert/oriented x4 in mild acute distress Eyes: PERRL, normal EOM.  Sclera clear.  No icterus Neuro: CN II-XII intact w/o focal sensory/motor deficits. Lymph: No head/neck/groin lymphadenopathy Psych:  No delerium/psychosis/paranoia HENT: Normocephalic, Mucus membranes moist.  No thrush Neck: Supple, No tracheal deviation Chest: No chest wall pain w good excursion CV:  Pulses intact.  Regular rhythm MS:  Normal AROM mjr joints.  No obvious deformity Abdomen: Soft.  Mildy distended.  Mildly tender at incisions only.  No evidence of peritonitis.  No incarcerated hernias. Ext:  SCDs BLE.  No mjr edema.  No  cyanosis Skin: No petechiae / purpura  Results:   Labs: Results for orders placed or performed during the hospital encounter of 07/01/16 (from the past 48 hour(s))  Basic metabolic panel     Status: Abnormal   Collection Time: 07/02/16  4:24 AM  Result Value Ref Range   Sodium 138 135 - 145 mmol/L   Potassium 3.8 3.5 - 5.1 mmol/L   Chloride 108 101 - 111 mmol/L   CO2 22 22 - 32 mmol/L   Glucose, Bld 131 (H) 65 - 99 mg/dL   BUN 18 6 - 20 mg/dL   Creatinine, Ser 0.75 0.44 - 1.00 mg/dL   Calcium 8.5 (L) 8.9 - 10.3 mg/dL   GFR calc non Af Amer >60 >60 mL/min   GFR calc Af Amer >60 >60 mL/min    Comment: (NOTE) The eGFR has been calculated using the CKD EPI equation. This calculation has not been validated in all clinical situations. eGFR's persistently <60 mL/min signify possible Chronic Kidney Disease.    Anion gap 8 5 - 15  CBC     Status: Abnormal   Collection Time: 07/02/16  4:24 AM  Result Value Ref Range   WBC 12.9 (H) 4.0 - 10.5 K/uL   RBC 3.23 (L) 3.87 - 5.11 MIL/uL   Hemoglobin 9.5 (L) 12.0 - 15.0 g/dL   HCT 28.9 (L) 36.0 - 46.0 %   MCV 89.5 78.0 - 100.0 fL   MCH 29.4 26.0 - 34.0 pg   MCHC 32.9 30.0 - 36.0 g/dL   RDW 14.2 11.5 - 15.5 %   Platelets 283 150 - 400 K/uL  Magnesium     Status: None   Collection Time: 07/02/16  4:24 AM  Result Value Ref Range   Magnesium 1.8 1.7 - 2.4 mg/dL  Surgical pcr screen     Status: None   Collection Time: 07/02/16  2:40 PM  Result Value Ref Range   MRSA, PCR NEGATIVE NEGATIVE   Staphylococcus aureus NEGATIVE NEGATIVE    Comment:        The Xpert SA Assay (FDA approved for NASAL specimens in patients over 74 years of age), is one component of a comprehensive surveillance program.  Test performance has been validated by [facility] for patients greater than or equal to 65 year old. It is not intended to diagnose infection nor to guide or monitor treatment.   Basic metabolic panel     Status: Abnormal   Collection  Time: 07/03/16  3:03 AM  Result Value Ref Range   Sodium 137 135 - 145 mmol/L   Potassium 3.6 3.5 - 5.1 mmol/L   Chloride 107 101 - 111 mmol/L   CO2 25 22 - 32 mmol/L   Glucose, Bld 138 (H) 65 - 99 mg/dL   BUN 16 6 - 20 mg/dL   Creatinine, Ser 0.70 0.44 - 1.00 mg/dL   Calcium 7.9 (L) 8.9 - 10.3 mg/dL   GFR calc non Af Amer >60 >60 mL/min   GFR calc Af Amer >60 >60 mL/min    Comment: (NOTE) The eGFR has been calculated using the CKD EPI equation. This calculation has not been validated in all clinical situations. eGFR's persistently <60 mL/min signify possible Chronic Kidney Disease.    Anion gap 5  5 - 15  CBC     Status: Abnormal   Collection Time: 07/03/16  3:03 AM  Result Value Ref Range   WBC 15.5 (H) 4.0 - 10.5 K/uL   RBC 2.97 (L) 3.87 - 5.11 MIL/uL   Hemoglobin 8.8 (L) 12.0 - 15.0 g/dL   HCT 26.4 (L) 36.0 - 46.0 %   MCV 88.9 78.0 - 100.0 fL   MCH 29.6 26.0 - 34.0 pg   MCHC 33.3 30.0 - 36.0 g/dL   RDW 14.6 11.5 - 15.5 %   Platelets 292 150 - 400 K/uL  Magnesium     Status: None   Collection Time: 07/03/16  3:03 AM  Result Value Ref Range   Magnesium 1.7 1.7 - 2.4 mg/dL    Imaging / Studies: Dg C-arm 1-60 Min-no Report  Result Date: 07/01/2016 CLINICAL DATA: surgery C-ARM 1-60 MINUTES Fluoroscopy was utilized by the requesting physician.  No radiographic interpretation.    Medications / Allergies: per chart  Antibiotics: Anti-infectives    Start     Dose/Rate Route Frequency Ordered Stop   07/03/16 0000  piperacillin-tazobactam (ZOSYN) IVPB 3.375 g     3.375 g 12.5 mL/hr over 240 Minutes Intravenous Every 8 hours 07/02/16 1910 07/07/16 2359   07/02/16 2000  piperacillin-tazobactam (ZOSYN) IVPB 3.375 g  Status:  Discontinued     3.375 g 12.5 mL/hr over 240 Minutes Intravenous Every 8 hours 07/02/16 1910 07/02/16 1910   07/02/16 1600  piperacillin-tazobactam (ZOSYN) IVPB 3.375 g     3.375 g 100 mL/hr over 30 Minutes Intravenous  Once 07/02/16 1602 07/02/16  1545   07/02/16 1515  clindamycin (CLEOCIN) 900 mg, gentamicin (GARAMYCIN) 240 mg in sodium chloride 0.9 % 1,000 mL for intraperitoneal lavage      Intraperitoneal To Surgery 07/02/16 1505 07/02/16 1653   07/02/16 1000  clindamycin (CLEOCIN) 900 mg, gentamicin (GARAMYCIN) 240 mg in sodium chloride 0.9 % 1,000 mL for intraperitoneal lavage  Status:  Discontinued      Intraperitoneal To Surgery 06/30/16 1345 06/30/16 1347   07/01/16 2200  cefoTEtan (CEFOTAN) 2 g in dextrose 5 % 50 mL IVPB     2 g 100 mL/hr over 30 Minutes Intravenous Every 12 hours 07/01/16 1804 07/01/16 2210   07/01/16 1024  clindamycin (CLEOCIN) 900 mg, gentamicin (GARAMYCIN) 240 mg in sodium chloride 0.9 % 1,000 mL for intraperitoneal lavage  Status:  Discontinued       As needed 07/01/16 1025 07/01/16 1605   07/01/16 0700  clindamycin (CLEOCIN) 900 mg, gentamicin (GARAMYCIN) 240 mg in sodium chloride 0.9 % 1,000 mL for intraperitoneal lavage  Status:  Discontinued      Intraperitoneal To Surgery 06/30/16 1334 06/30/16 1344   07/01/16 0700  clindamycin (CLEOCIN) 900 mg, gentamicin (GARAMYCIN) 240 mg in sodium chloride 0.9 % 1,000 mL for intraperitoneal lavage  Status:  Discontinued      Intraperitoneal To Surgery 06/30/16 1347 07/01/16 1741   07/01/16 0649  cefoTEtan (CEFOTAN) 2 g in dextrose 5 % 50 mL IVPB     2 g 100 mL/hr over 30 Minutes Intravenous On call to O.R. 07/01/16 3500 07/01/16 0835   07/01/16 0649  neomycin (MYCIFRADIN) tablet 1,000 mg  Status:  Discontinued     1,000 mg Oral 3 times per day 07/01/16 0650 07/01/16 0713   07/01/16 0649  metroNIDAZOLE (FLAGYL) tablet 1,000 mg  Status:  Discontinued     1,000 mg Oral 3 times per day 07/01/16 0650 07/01/16 9381  Note: Portions of this report may have been transcribed using voice recognition software. Every effort was made to ensure accuracy; however, inadvertent computerized transcription errors may be present.   Any transcriptional errors that result from  this process are unintentional.     [name], M.D., F.A.C.S. Gastrointestinal and Minimally Invasive Surgery [facility] [address], [place], [place] [address] [phone] Main / Paging   07/03/2016
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
## 2016-07-04 LAB — CBC
HEMATOCRIT: 21.3 % — AB (ref 36.0–46.0)
Hemoglobin: 7 g/dL — ABNORMAL LOW (ref 12.0–15.0)
MCH: 29.5 pg (ref 26.0–34.0)
MCHC: 32.9 g/dL (ref 30.0–36.0)
MCV: 89.9 fL (ref 78.0–100.0)
PLATELETS: 249 10*3/uL (ref 150–400)
RBC: 2.37 MIL/uL — AB (ref 3.87–5.11)
RDW: 14.7 % (ref 11.5–15.5)
WBC: 13.3 10*3/uL — AB (ref 4.0–10.5)

## 2016-07-04 LAB — CREATININE, SERUM
Creatinine, Ser: 0.65 mg/dL (ref 0.44–1.00)
GFR calc non Af Amer: >60 mL/min (ref >60)

## 2016-07-04 LAB — MAGNESIUM: Magnesium: 1.8 mg/dL (ref 1.7–2.4)

## 2016-07-04 LAB — POTASSIUM: POTASSIUM: 3 mmol/L — AB (ref 3.5–5.1)

## 2016-07-04 NOTE — H&P (Signed)
Dear Doctor: [name] This patient has been identified as a candidate for PICC for the following reason (s): IV therapy over 48 hours, poor veins/poor circulatory system (CHF, COPD, emphysema, diabetes, steroid use, IV drug abuse, etc.) and restarts due to phlebitis and infiltration in 24 hours If you agree, please write an order for the indicated device. For any questions contact the Vascular Access Team at [phone] if no answer, please leave a message.  Thank you for supporting the early vascular access assessment program.

## 2016-07-04 NOTE — Progress Notes (Signed)
2 Days Post-Op  Subjective: No complaints, no ostomy function.  Ambulated to the door last night  Objective: Vital signs in last 24 hours: Temp:  [98 F (36.7 C)-98.6 F (37 C)] 98.4 F (36.9 C) (11/04 0500) Pulse Rate:  [89-104] 89 (11/04 0500) Resp:  [11-20] 18 (11/04 0500) BP: (107-130)/(67-73) 130/73 (11/04 0500) SpO2:  [90 %-99 %] 93 % (11/04 0500)   Intake/Output from previous day: 11/03 0701 - 11/04 0700 In: 2600 [P.O.:120; I.V.:2300; NG/GT:30; IV Piggyback:150] Out: [id number] [Urine:1350; Emesis/NG output:150; Drains:145] Intake/Output this shift: No intake/output data recorded.   General appearance: alert and cooperative GI: soft, mildly distended  Incision: no significant drainage  Lab Results:   Recent Labs  07/03/16 0303 07/04/16 0509  WBC 15.5* 13.3*  HGB 8.8* 7.0*  HCT 26.4* 21.3*  PLT 292 249   BMET  Recent Labs  07/02/16 0424 07/03/16 0303 07/04/16 0509  NA 138 137  --   K 3.8 3.6 3.0*  CL 108 107  --   CO2 22 25  --   GLUCOSE 131* 138*  --   BUN 18 16  --   CREATININE 0.75 0.70 0.65  CALCIUM 8.5* 7.9*  --    PT/INR No results for input(s): LABPROT, INR in the last 72 hours. ABG No results for input(s): PHART, HCO3 in the last 72 hours.  Invalid input(s): PCO2, PO2  MEDS, Scheduled . chlorhexidine  60 mL Topical Once  . enoxaparin (LOVENOX) injection  40 mg Subcutaneous Q24H  . lip balm  1 application Topical BID  . magnesium sulfate 1 - 4 g bolus IVPB  2 g Intravenous Once  . metoprolol  2.5 mg Intravenous Q8H  . multivitamin with minerals  1 tablet Oral Daily  . piperacillin-tazobactam (ZOSYN)  IV  3.375 g Intravenous Q8H  . sodium chloride flush  3 mL Intravenous Q12H    Studies/Results: No results found.  Assessment: s/p Procedure(s): LAPAROSCOPY DIAGNOSTIC, LAPAROSCOPIC LYSIS OF ADHESIONS, SEROSAL REPAIR SMALL BOWEL RESECTION, OMENTOPEXY APPLICATION OF WOUND VAC LAPAROSCOPIC LYSIS OF ADHESIONS SMALL BOWEL  RESECTION APPLICATION OF WOUND VAC Patient Active Problem List   Diagnosis Date Noted  . 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 Obesity 07/01/2016  . Family history of colon cancer 06/10/2016  . Family history of ovarian cancer s/p BSO 07/01/2016 06/10/2016  . Family history of uterine cancer 06/10/2016  . Family history of breast cancer in female 06/10/2016  . 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 05/14/2016 05/14/2016  . Rectal cancer s/p redo robotic LAR with coloanal anastomosis 07/01/2016 05/14/2016  . Osteopenia 04/25/2015  . Ovarian cyst s/p BSO 07/01/2016 10/03/2012  . Dysplasia of colon     Expected post op ileus  Plan: Cont NG until bowel function  Ambulate   LOS: 3 days     .Rosario Adie, MD [facility], [place]   07/04/2016 8:14 AM

## 2016-07-05 MED ORDER — KCL IN DEXTROSE-NACL 40-5-0.45 MEQ/L-%-% IV SOLN
INTRAVENOUS | Status: DC
Start: 2016-07-05 — End: 2016-07-08
  Administered 2016-07-05 – 2016-07-06 (×2): via INTRAVENOUS
  Filled 2016-07-05 (×4): qty 1000

## 2016-07-05 MED ORDER — FAMOTIDINE IN NACL 20-0.9 MG/50ML-% IV SOLN
20.0000 mg | Freq: Two times a day (BID) | INTRAVENOUS | Status: DC
  Administered 2016-07-05 – 2016-07-07 (×6): 20 mg via INTRAVENOUS
  Filled 2016-07-05 (×8): qty 50

## 2016-07-05 MED ORDER — SODIUM CHLORIDE 0.9 % IV SOLN: 250.0000 mL | INTRAVENOUS | Status: DC | PRN

## 2016-07-05 MED ORDER — KCL IN DEXTROSE-NACL 40-5-0.45 MEQ/L-%-% IV SOLN: INTRAVENOUS | Status: DC

## 2016-07-05 NOTE — Progress Notes (Signed)
Pt ambulated in hall approx 540 feet. Tolerated well. Medicated with Dilaudid 1 mg  IV when she returned to bed.

## 2016-07-05 NOTE — Progress Notes (Signed)
3 Days Post-Op  Subjective: NG tube bothering her, no ostomy function.  Having some GERD symptoms.  Ambulated some yesterday  Objective: Vital signs in last 24 hours: Temp:  [98 F (36.7 C)-98.9 F (37.2 C)] 98.1 F (36.7 C) (11/05 [id number]) Pulse Rate:  [80-87] 82 (11/05 0608) Resp:  [15-16] 16 (11/05 0608) BP: (131-135)/(70-77) 131/70 (11/05 0608) SpO2:  [94 %-97 %] 96 % (11/05 0608) Weight:  [90 kg (198 lb 6.4 oz)] 90 kg (198 lb 6.4 oz) (11/05 0500)   Intake/Output from previous day: 11/04 0701 - 11/05 0700 In: 2850 [I.V.:2400; NG/GT:300; IV Piggyback:150] Out: [id number] [Urine:3100; Drains:215] Intake/Output this shift: No intake/output data recorded.   General appearance: alert and cooperative GI: soft, mildly distended  Incision: no significant drainage Ostomy pink and viable with no bilious output  Lab Results:   Recent Labs  07/03/16 0303 07/04/16 0509  WBC 15.5* 13.3*  HGB 8.8* 7.0*  HCT 26.4* 21.3*  PLT 292 249   BMET  Recent Labs  07/03/16 0303 07/04/16 0509  NA 137  --   K 3.6 3.0*  CL 107  --   CO2 25  --   GLUCOSE 138*  --   BUN 16  --   CREATININE 0.70 0.65  CALCIUM 7.9*  --    PT/INR No results for input(s): LABPROT, INR in the last 72 hours. ABG No results for input(s): PHART, HCO3 in the last 72 hours.  Invalid input(s): PCO2, PO2  MEDS, Scheduled . chlorhexidine  60 mL Topical Once  . enoxaparin (LOVENOX) injection  40 mg Subcutaneous Q24H  . famotidine (PEPCID) IV  20 mg Intravenous Q12H  . lip balm  1 application Topical BID  . magnesium sulfate 1 - 4 g bolus IVPB  2 g Intravenous Once  . metoprolol  2.5 mg Intravenous Q8H  . multivitamin with minerals  1 tablet Oral Daily  . piperacillin-tazobactam (ZOSYN)  IV  3.375 g Intravenous Q8H    Studies/Results: No results found.  Assessment: s/p Procedure(s): LAPAROSCOPY DIAGNOSTIC, LAPAROSCOPIC LYSIS OF ADHESIONS, SEROSAL REPAIR SMALL BOWEL RESECTION, OMENTOPEXY APPLICATION OF  WOUND VAC LAPAROSCOPIC LYSIS OF ADHESIONS SMALL BOWEL RESECTION APPLICATION OF WOUND VAC Patient Active Problem List   Diagnosis Date Noted  . 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 Obesity 07/01/2016  . Family history of colon cancer 06/10/2016  . Family history of ovarian cancer s/p BSO 07/01/2016 06/10/2016  . Family history of uterine cancer 06/10/2016  . Family history of breast cancer in female 06/10/2016  . 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 05/14/2016 05/14/2016  . Rectal cancer s/p redo robotic LAR with coloanal anastomosis 07/01/2016 05/14/2016  . Osteopenia 04/25/2015  . Ovarian cyst s/p BSO 07/01/2016 10/03/2012  . Dysplasia of colon     Expected post op ileus  Plan: Cont NG until bowel function  Hypokalemia: potassium replacements in IV fluids Pepcid for GERD symptoms Ambulate Acute blood loss anemia: currently asymptomatic.  Recheck labs in AM  LOS: 4 days     .Rosario Adie, MD [facility], [place]   07/05/2016 8:36 AM

## 2016-07-05 NOTE — Progress Notes (Signed)
Pt ambulated in the hall for the second time from her room 1336 to 1333. Sitting up in chair on return to her room. Family at bedside.

## 2016-07-06 DIAGNOSIS — E876 Hypokalemia: Secondary | ICD-10-CM

## 2016-07-06 LAB — BASIC METABOLIC PANEL
Anion gap: 10 (ref 5–15)
CALCIUM: 8.1 mg/dL — AB (ref 8.9–10.3)
CO2: 29 mmol/L (ref 22–32)
CREATININE: 0.68 mg/dL (ref 0.44–1.00)
Chloride: 101 mmol/L (ref 101–111)
GFR calc Af Amer: >60 mL/min (ref >60)
GLUCOSE: 125 mg/dL — AB (ref 65–99)
Potassium: 3 mmol/L — ABNORMAL LOW (ref 3.5–5.1)
Sodium: 140 mmol/L (ref 135–145)

## 2016-07-06 LAB — MAGNESIUM: Magnesium: 1.7 mg/dL (ref 1.7–2.4)

## 2016-07-06 LAB — CBC
HCT: 21.3 % — ABNORMAL LOW (ref 36.0–46.0)
Hemoglobin: 7 g/dL — ABNORMAL LOW (ref 12.0–15.0)
MCH: 28.1 pg (ref 26.0–34.0)
MCHC: 32.9 g/dL (ref 30.0–36.0)
MCV: 85.5 fL (ref 78.0–100.0)
PLATELETS: 306 10*3/uL (ref 150–400)
RBC: 2.49 MIL/uL — ABNORMAL LOW (ref 3.87–5.11)
RDW: 13.9 % (ref 11.5–15.5)
WBC: 8.1 10*3/uL (ref 4.0–10.5)

## 2016-07-06 MED ORDER — MAGNESIUM SULFATE 2 GM/50ML IV SOLN
2.0000 g | Freq: Once | INTRAVENOUS | Status: AC
  Administered 2016-07-06: 2 g via INTRAVENOUS
  Filled 2016-07-06: qty 50

## 2016-07-06 MED ORDER — POTASSIUM CHLORIDE 2 MEQ/ML IV SOLN
30.0000 meq | Freq: Once | INTRAVENOUS | Status: AC
  Administered 2016-07-06: 30 meq via INTRAVENOUS
  Filled 2016-07-06: qty 15

## 2016-07-06 NOTE — Progress Notes (Addendum)
[facility]  [address] [place], [place] [address] Phone: [phone] FAX: [phone]   [name] [id number] 30-Dec-1955  CARE TEAM:  PCP: [name], NP  Outpatient Care Team: Patient Care Team: [name], NP as PCP - General (Obstetrics and Gynecology) [name], MD as Consulting Physician (General Surgery) [name], MD as Consulting Physician (Gastroenterology) [name], MD as Consulting Physician (Gynecology) Tania Ade, RN as Registered Nurse  Inpatient Treatment Team: Treatment Team: Attending Provider: [name], MD; Technician: Sueanne Margarita, NT; Technician: Abbe Amsterdam, NT; Registered Nurse: Bailey Mech, RN; Registered Nurse: Mertha Baars, RN; Registered Nurse: Thermon Leyland, RN  Problem List:   Principal Problem:   Rectal cancer s/p redo robotic LAR with coloanal anastomosis 07/01/2016 Active Problems:   Ovarian cyst s/p BSO 07/01/2016   Family history of ovarian cancer s/p BSO 07/01/2016   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   Uterine fibromyoma   Obesity  SURGERY 07/01/2016  POST-OPERATIVE DIAGNOSIS:    RECTAL CANCER LEFT OVARIAN MASS STRONG FAMILY HISTORY OF OVARIAN CANCER  PROCEDURE:  : XI ROBOTIC ASSISTED REDO LOWER ANTERIOR  RECTO-SIGMOID RESECTION  COLOANAL Handsewn ANASTOMOSIS  SPLENIC FLEXURE MOBILIZATION BILATERAL SALPINGO-OOPHORECTOMY LAPAROSCOPIC & ROBOTIC LYSIS OF ADHESIONS X 2 HOURS (33% of case) DIVERTING LOOP ILEOSTOMY Cystoscopy with placement of bilateral lighted ureteral stents    SURGEON:  [name], MD  4 Days Post-Op  07/02/2016  POST-OPERATIVE DIAGNOSIS:  DELAYED SMALL BOWEL PERFORATION  PROCEDURE:   LAPAROSCOPY DIAGNOSTIC SMALL BOWEL RESECTION SEROSAL REPAIR SMALL BOWEL  OMENTOPEXY  Surgeon(s): [name], MD  Assessment  Stabilizing  Plan:  -NGT clamp trial with clears today.  D/c NGT in AM if tolerates &  +flatus in ileostomy -surgical pelvic & RUQ drainage - keep x 10 days minimum -IV ABx x 5 days -IVF -low K & Mag - replace -pain control -f/u pathology Ostomy care -wound vvac to help wound close down - watch LUQ port w former bile.  Pack w wick -VTE prophylaxis- SCDs, etc -mobilize as tolerated to help recovery -transfer to floor later today  I updated the patient's status to the patient.  & sister.  RN Jinny Sanders.  OR findings & Tx noted.  Recommendations were made.  Questions were answered.  She expressed understanding & appreciation.   [name], M.D., F.A.C.S. Gastrointestinal and Minimally Invasive Surgery [facility] [address] [place], [place] [address] [phone] Main / Paging   07/06/2016  Subjective:  Walking better NGT output minimal - no nausea.  Wants NGT out   Objective:  Vital signs:  Vitals:   07/05/16 0608 07/05/16 1400 07/05/16 2135 07/06/16 0438  BP: 131/70 137/76 136/72 139/79  Pulse: 82 73 71 70  Resp: 16 14  16   Temp: 98.1 F (36.7 C) 98.2 F (36.8 C) 98.5 F (36.9 C) 98.4 F (36.9 C)  TempSrc: Oral Oral Oral Oral  SpO2: 96% 96% 97% 97%  Weight:      Height:        Last BM Date:  (pta)  Intake/Output   Yesterday:  11/05 0701 - 11/06 0700 In: 3361.3 [I.V.:2636.3; NG/GT:400; IV Piggyback:200] Out: 8127 [Urine:3500; Emesis/NG output:100; Drains:105; Stool:25] This shift:  Total I/O In: 950 [I.V.:900; IV Piggyback:50] Out: 2130 [Urine:1900; Emesis/NG output:100; Drains:105; Stool:25]  Bowel function:  Flatus: Scant  BM:  No  RLQ drain (pelvic):Serosanguinous  LUQ drain (to RUQ) - serous  Physical Exam:  General: Pt awake/alert/oriented x4 in No acute distress.  Alert, smiling Eyes: PERRL, normal EOM.  Sclera clear.  No icterus Neuro: CN II-XII intact w/o focal sensory/motor deficits. Lymph: No head/neck/groin lymphadenopathy Psych:  No delerium/psychosis/paranoia HENT:  Normocephalic, Mucus membranes moist.  No thrush Neck: Supple, No tracheal deviation Chest: No chest wall pain w good excursion CV:  Pulses intact.  Regular rhythm MS: Normal AROM mjr joints.  No obvious deformity Abdomen: Soft.  Nondistended.  Mildly tender at incisions only.  R sided ileostomy flat pink w scant gas in bag.   No evidence of peritonitis.  No incarcerated hernias. Ext:  SCDs BLE.  No mjr edema.  No cyanosis Skin: No petechiae / purpura  Results:   Labs: Results for orders placed or performed during the hospital encounter of 07/01/16 (from the past 48 hour(s))  CBC     Status: Abnormal   Collection Time: 07/06/16  4:22 AM  Result Value Ref Range   WBC 8.1 4.0 - 10.5 K/uL   RBC 2.49 (L) 3.87 - 5.11 MIL/uL   Hemoglobin 7.0 (L) 12.0 - 15.0 g/dL   HCT 21.3 (L) 36.0 - 46.0 %   MCV 85.5 78.0 - 100.0 fL   MCH 28.1 26.0 - 34.0 pg   MCHC 32.9 30.0 - 36.0 g/dL   RDW 13.9 11.5 - 15.5 %   Platelets 306 150 - 400 K/uL  Basic metabolic panel     Status: Abnormal   Collection Time: 07/06/16  4:22 AM  Result Value Ref Range   Sodium 140 135 - 145 mmol/L   Potassium 3.0 (L) 3.5 - 5.1 mmol/L   Chloride 101 101 - 111 mmol/L   CO2 29 22 - 32 mmol/L   Glucose, Bld 125 (H) 65 - 99 mg/dL   BUN <5 (L) 6 - 20 mg/dL   Creatinine, Ser 0.68 0.44 - 1.00 mg/dL   Calcium 8.1 (L) 8.9 - 10.3 mg/dL   GFR calc non Af Amer >60 >60 mL/min   GFR calc Af Amer >60 >60 mL/min    Comment: (NOTE) The eGFR has been calculated using the CKD EPI equation. This calculation has not been validated in all clinical situations. eGFR's persistently <60 mL/min signify possible Chronic Kidney Disease.    Anion gap 10 5 - 15    Imaging / Studies: No results found.  Medications / Allergies: per chart  Antibiotics: Anti-infectives    Start     Dose/Rate Route Frequency Ordered Stop   07/03/16 0000  piperacillin-tazobactam (ZOSYN) IVPB 3.375 g     3.375 g 12.5 mL/hr over 240 Minutes Intravenous Every 8  hours 07/02/16 1910 07/07/16 2359   07/02/16 2000  piperacillin-tazobactam (ZOSYN) IVPB 3.375 g  Status:  Discontinued     3.375 g 12.5 mL/hr over 240 Minutes Intravenous Every 8 hours 07/02/16 1910 07/02/16 1910   07/02/16 1600  piperacillin-tazobactam (ZOSYN) IVPB 3.375 g     3.375 g 100 mL/hr over 30 Minutes Intravenous  Once 07/02/16 1602 07/02/16 1545   07/02/16 1515  clindamycin (CLEOCIN) 900 mg, gentamicin (GARAMYCIN) 240 mg in sodium chloride 0.9 % 1,000 mL for intraperitoneal lavage      Intraperitoneal To Surgery 07/02/16 1505 07/02/16 1653   07/02/16 1000  clindamycin (CLEOCIN) 900 mg, gentamicin (GARAMYCIN) 240 mg in sodium chloride 0.9 % 1,000 mL for intraperitoneal lavage  Status:  Discontinued      Intraperitoneal To Surgery 06/30/16 1345 06/30/16 1347   07/01/16 2200  cefoTEtan (  CEFOTAN) 2 g in dextrose 5 % 50 mL IVPB     2 g 100 mL/hr over 30 Minutes Intravenous Every 12 hours 07/01/16 1804 07/01/16 2210   07/01/16 1024  clindamycin (CLEOCIN) 900 mg, gentamicin (GARAMYCIN) 240 mg in sodium chloride 0.9 % 1,000 mL for intraperitoneal lavage  Status:  Discontinued       As needed 07/01/16 1025 07/01/16 1605   07/01/16 0700  clindamycin (CLEOCIN) 900 mg, gentamicin (GARAMYCIN) 240 mg in sodium chloride 0.9 % 1,000 mL for intraperitoneal lavage  Status:  Discontinued      Intraperitoneal To Surgery 06/30/16 1334 06/30/16 1344   07/01/16 0700  clindamycin (CLEOCIN) 900 mg, gentamicin (GARAMYCIN) 240 mg in sodium chloride 0.9 % 1,000 mL for intraperitoneal lavage  Status:  Discontinued      Intraperitoneal To Surgery 06/30/16 1347 07/01/16 1741   07/01/16 0649  cefoTEtan (CEFOTAN) 2 g in dextrose 5 % 50 mL IVPB     2 g 100 mL/hr over 30 Minutes Intravenous On call to O.R. 07/01/16 4461 07/01/16 0835   07/01/16 0649  neomycin (MYCIFRADIN) tablet 1,000 mg  Status:  Discontinued     1,000 mg Oral 3 times per day 07/01/16 0650 07/01/16 0713   07/01/16 0649  metroNIDAZOLE (FLAGYL)  tablet 1,000 mg  Status:  Discontinued     1,000 mg Oral 3 times per day 07/01/16 0650 07/01/16 9012        Note: Portions of this report may have been transcribed using voice recognition software. Every effort was made to ensure accuracy; however, inadvertent computerized transcription errors may be present.   Any transcriptional errors that result from this process are unintentional.     [name], M.D., F.A.C.S. Gastrointestinal and Minimally Invasive Surgery [facility] [address] [place],  [address] [phone] Main / Paging   07/06/2016

## 2016-07-06 NOTE — Consult Note (Signed)
  [facility] Nurse ostomy consult note Stoma type/location: RLQ loop ileostomy Stomal assessment/size: red, slightly elevated and edematous, red rubber catheter "bridge" in place. 1 and 5/8 inches round. Peristomal assessment: intact Treatment options for stomal/peristomal skin: skin barrier ring Output serosanguinous soft green stool in pouch Ostomy pouching: 1pc.flat, flexible pouching system with skin barrier ring.  Education provided: Daughters observed the pouch removal, sizing of stoma, preparation of pouch, placement of ring and placement of pouch.  Demonstrated Lock and Roll closure. Enrolled patient in [facility] program: No   [facility] Nurse wound consult note Reason for Consult:midline surgical wound Wound type: surgical Pressure Ulcer POA: No Measurement:11cm x 4cm x 4.5cm Wound bed:red, moist Drainage (amount, consistency, odor) serosanguinous Periwound: several stab wounds, one at 7 o'clock was oozing purulence.  Replaced with packing strip.  Dressing procedure/placement/frequency: Wound cleansed after previous NPWT device and sponge removed.  Patted gently dry.  Defect filled with 1 piece of black foam, sealed with drape and attached to NPWT at 174mmHg constant negative pressure.  An immediate seal is achieved. Patient tolerated procedure well. Family present (2 daughters and a sister) for dressing change.  [facility] team will follow.  Domenic Moras RN BSN [place] Pager [phone]

## 2016-07-07 NOTE — Progress Notes (Signed)
Spoke with patient at bedside. Patient has no preference for [facility] agency. Contacted [facility], they are unable to take. Contacted [facility], they can take with start of care on 11/13. Spoke with wound and ostomy nurse, if patient stays through 11/10, start of care will be okay for 11/13. Vac will be changed on 11/10, ostomy appliance will be changed on 11/10, patient has previous experience with ostomy care and has done well with ostomy thus far. [employer] application placed on shadow chart for MD to sign. Contacted [employer] to make them aware, once application signed will forward to [facility] for insurance approval. Will continue to follow for d/c needs.

## 2016-07-07 NOTE — Progress Notes (Signed)
[name]  [address] [place], [place] [address] Phone: [phone] FAX: [phone]   [name] [id number] 06-05-1956  CARE TEAM:  PCP: [name], NP  Outpatient Care Team: Patient Care Team: [name], NP as PCP - General (Obstetrics and Gynecology) [name], MD as Consulting Physician (General Surgery) [name], MD as Consulting Physician (Gastroenterology) [name], MD as Consulting Physician (Gynecology) Tania Ade, RN as Registered Nurse  Inpatient Treatment Team: Treatment Team: Attending Provider: [name], MD; Technician: Sueanne Margarita, NT; Registered Nurse: Bailey Mech, RN; Registered Nurse: Mertha Baars, RN  Problem List:   Principal Problem:   Rectal cancer s/p redo robotic LAR with coloanal anastomosis 07/01/2016 Active Problems:   Ovarian cyst s/p BSO 07/01/2016   Family history of ovarian cancer s/p BSO 07/01/2016   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   Uterine fibromyoma   Obesity   Hypokalemia   Hypomagnesemia  SURGERY 07/01/2016  POST-OPERATIVE DIAGNOSIS:    RECTAL CANCER LEFT OVARIAN MASS STRONG FAMILY HISTORY OF OVARIAN CANCER  PROCEDURE:  : XI ROBOTIC ASSISTED REDO LOWER ANTERIOR  RECTO-SIGMOID RESECTION  COLOANAL Handsewn ANASTOMOSIS  SPLENIC FLEXURE MOBILIZATION BILATERAL SALPINGO-OOPHORECTOMY LAPAROSCOPIC & ROBOTIC LYSIS OF ADHESIONS X 2 HOURS (33% of case) DIVERTING LOOP ILEOSTOMY Cystoscopy with placement of bilateral lighted ureteral stents    SURGEON:  [name], MD  5 Days Post-Op  07/02/2016  POST-OPERATIVE DIAGNOSIS:  DELAYED SMALL BOWEL PERFORATION  PROCEDURE:   LAPAROSCOPY DIAGNOSTIC SMALL BOWEL RESECTION SEROSAL REPAIR SMALL BOWEL  OMENTOPEXY  Surgeon(s): [name], MD  Assessment  Stabilizing  Plan:  -NGT clamp trial with clears today.  D/c NGT in AM if tolerates & +flatus in ileostomy -surgical pelvic & RUQ  drainage - keep x 10 days minimum -IV ABx x 5 days -IVF -low K & Mag - replace -pain control -f/u pathology Ostomy care -wound vvac to help wound close down - watch LUQ port w former bile.  Pack w wick -VTE prophylaxis- SCDs, etc -mobilize as tolerated to help recovery -transfer to floor later today  I updated the patient's status to the patient.  & sister.  RN Jinny Sanders.  OR findings & Tx noted.  Recommendations were made.  Questions were answered.  She expressed understanding & appreciation.   [name], M.D., F.A.C.S. Gastrointestinal and Minimally Invasive Surgery [facility] [address], [place], [place] [address] [phone] Main / Paging   07/07/2016  Subjective:  Walking better NGT output minimal - no nausea.  Wants NGT out   Objective:  Vital signs:  Vitals:   07/06/16 0645 07/06/16 1436 07/06/16 2126 07/07/16 0459  BP:  121/81 120/66 140/85  Pulse:  72 74 94  Resp:  _0 Temp:  98.5 F (36.9 C) 97.6 F (36.4 C) 97.7 F (36.5 C)  TempSrc:  Oral Oral Oral  SpO2:  97% 99% 98%  Weight: 86.5 kg (190 lb 12.8 oz)   85 kg (187 lb 4.8 oz)  Height:        Last BM Date:  (pta)  Intake/Output   Yesterday:  11/06 0701 - 11/07 0700 In: 680 [P.O.:30; I.V.:550; IV Piggyback:100] Out: 2680 [Urine:2400; Emesis/NG output:25; Drains:205; Stool:50] This shift:  Total I/O In: 680 [P.O.:30; I.V.:550; IV Piggyback:100] Out: 785 [Urine:650; Drains:85; Stool:50]  Bowel function:  Flatus: Scant  BM:  No  RLQ drain (pelvic):Serosanguinous  LUQ drain (to RUQ) - serous  Physical Exam:  General: Pt awake/alert/oriented x4 in No acute distress.  Alert, smiling Eyes: PERRL, normal EOM.  Sclera clear.  No icterus Neuro: CN II-XII intact w/o focal sensory/motor deficits. Lymph: No head/neck/groin lymphadenopathy Psych:  No delerium/psychosis/paranoia HENT: Normocephalic, Mucus membranes moist.  No thrush Neck: Supple,  No tracheal deviation Chest: No chest wall pain w good excursion CV:  Pulses intact.  Regular rhythm MS: Normal AROM mjr joints.  No obvious deformity Abdomen: Soft.  Nondistended.  Mildly tender at incisions only.  R sided ileostomy flat pink w scant gas in bag.   No evidence of peritonitis.  No incarcerated hernias. Ext:  SCDs BLE.  No mjr edema.  No cyanosis Skin: No petechiae / purpura  Results:   Labs: Results for orders placed or performed during the hospital encounter of 07/01/16 (from the past 48 hour(s))  CBC     Status: Abnormal   Collection Time: 07/06/16  4:22 AM  Result Value Ref Range   WBC 8.1 4.0 - 10.5 K/uL   RBC 2.49 (L) 3.87 - 5.11 MIL/uL   Hemoglobin 7.0 (L) 12.0 - 15.0 g/dL   HCT 21.3 (L) 36.0 - 46.0 %   MCV 85.5 78.0 - 100.0 fL   MCH 28.1 26.0 - 34.0 pg   MCHC 32.9 30.0 - 36.0 g/dL   RDW 13.9 11.5 - 15.5 %   Platelets 306 150 - 400 K/uL  Basic metabolic panel     Status: Abnormal   Collection Time: 07/06/16  4:22 AM  Result Value Ref Range   Sodium 140 135 - 145 mmol/L   Potassium 3.0 (L) 3.5 - 5.1 mmol/L   Chloride 101 101 - 111 mmol/L   CO2 29 22 - 32 mmol/L   Glucose, Bld 125 (H) 65 - 99 mg/dL   BUN <5 (L) 6 - 20 mg/dL   Creatinine, Ser 0.68 0.44 - 1.00 mg/dL   Calcium 8.1 (L) 8.9 - 10.3 mg/dL   GFR calc non Af Amer >60 >60 mL/min   GFR calc Af Amer >60 >60 mL/min    Comment: (NOTE) The eGFR has been calculated using the CKD EPI equation. This calculation has not been validated in all clinical situations. eGFR's persistently <60 mL/min signify possible Chronic Kidney Disease.    Anion gap 10 5 - 15  Magnesium     Status: None   Collection Time: 07/06/16  4:22 AM  Result Value Ref Range   Magnesium 1.7 1.7 - 2.4 mg/dL    Imaging / Studies: No results found.  Medications / Allergies: per chart  Antibiotics: Anti-infectives    Start     Dose/Rate Route Frequency Ordered Stop   07/03/16 0000  piperacillin-tazobactam (ZOSYN) IVPB 3.375 g      3.375 g 12.5 mL/hr over 240 Minutes Intravenous Every 8 hours 07/02/16 1910 07/07/16 2359   07/02/16 2000  piperacillin-tazobactam (ZOSYN) IVPB 3.375 g  Status:  Discontinued     3.375 g 12.5 mL/hr over 240 Minutes Intravenous Every 8 hours 07/02/16 1910 07/02/16 1910   07/02/16 1600  piperacillin-tazobactam (ZOSYN) IVPB 3.375 g     3.375 g 100 mL/hr over 30 Minutes Intravenous  Once 07/02/16 1602 07/02/16 1545   07/02/16 1515  clindamycin (CLEOCIN) 900 mg, gentamicin (GARAMYCIN) 240 mg in sodium chloride 0.9 % 1,000 mL for intraperitoneal lavage      Intraperitoneal To Surgery 07/02/16 1505 07/02/16 1653   07/02/16 1000  clindamycin (CLEOCIN) 900 mg, gentamicin (GARAMYCIN) 240 mg in sodium chloride  0.9 % 1,000 mL for intraperitoneal lavage  Status:  Discontinued      Intraperitoneal To Surgery 06/30/16 1345 06/30/16 1347   07/01/16 2200  cefoTEtan (CEFOTAN) 2 g in dextrose 5 % 50 mL IVPB     2 g 100 mL/hr over 30 Minutes Intravenous Every 12 hours 07/01/16 1804 07/01/16 2210   07/01/16 1024  clindamycin (CLEOCIN) 900 mg, gentamicin (GARAMYCIN) 240 mg in sodium chloride 0.9 % 1,000 mL for intraperitoneal lavage  Status:  Discontinued       As needed 07/01/16 1025 07/01/16 1605   07/01/16 0700  clindamycin (CLEOCIN) 900 mg, gentamicin (GARAMYCIN) 240 mg in sodium chloride 0.9 % 1,000 mL for intraperitoneal lavage  Status:  Discontinued      Intraperitoneal To Surgery 06/30/16 1334 06/30/16 1344   07/01/16 0700  clindamycin (CLEOCIN) 900 mg, gentamicin (GARAMYCIN) 240 mg in sodium chloride 0.9 % 1,000 mL for intraperitoneal lavage  Status:  Discontinued      Intraperitoneal To Surgery 06/30/16 1347 07/01/16 1741   07/01/16 0649  cefoTEtan (CEFOTAN) 2 g in dextrose 5 % 50 mL IVPB     2 g 100 mL/hr over 30 Minutes Intravenous On call to O.R. 07/01/16 9449 07/01/16 0835   07/01/16 0649  neomycin (MYCIFRADIN) tablet 1,000 mg  Status:  Discontinued     1,000 mg Oral 3 times per day 07/01/16 0650  07/01/16 0713   07/01/16 0649  metroNIDAZOLE (FLAGYL) tablet 1,000 mg  Status:  Discontinued     1,000 mg Oral 3 times per day 07/01/16 0650 07/01/16 6759        Note: Portions of this report may have been transcribed using voice recognition software. Every effort was made to ensure accuracy; however, inadvertent computerized transcription errors may be present.   Any transcriptional errors that result from this process are unintentional.     [name], M.D., F.A.C.S. Gastrointestinal and Minimally Invasive Surgery [facility] [address] [place], [place] [address] [phone] Main / Paging   07/07/2016

## 2016-07-07 NOTE — Progress Notes (Signed)
Status post redo low anterior resection.  No evidence of cancer at colon and rectal anastomosis.  Lymph nodes negative.  Fallopian tubes and left ovary benign.

## 2016-07-08 ENCOUNTER — Encounter: Payer: Managed Care, Other (non HMO) | Admitting: Genetic Counselor

## 2016-07-08 ENCOUNTER — Other Ambulatory Visit: Payer: Managed Care, Other (non HMO)

## 2016-07-08 LAB — CREATININE, SERUM
Creatinine, Ser: 0.7 mg/dL (ref 0.44–1.00)
GFR calc non Af Amer: >60 mL/min (ref >60)

## 2016-07-08 LAB — URINALYSIS, ROUTINE W REFLEX MICROSCOPIC
Bilirubin Urine: NEGATIVE
GLUCOSE, UA: NEGATIVE mg/dL
Hgb urine dipstick: NEGATIVE
KETONES UR: NEGATIVE mg/dL
LEUKOCYTES UA: NEGATIVE
NITRITE: NEGATIVE
PROTEIN: NEGATIVE mg/dL
Specific Gravity, Urine: 1.006 (ref 1.005–1.030)
pH: 7 (ref 5.0–8.0)

## 2016-07-08 LAB — POTASSIUM: POTASSIUM: 4 mmol/L (ref 3.5–5.1)

## 2016-07-08 MED ORDER — SODIUM CHLORIDE 0.9% FLUSH: 3.0000 mL | INTRAVENOUS | Status: DC | PRN

## 2016-07-08 MED ORDER — METOPROLOL TARTRATE 12.5 MG HALF TABLET
12.5000 mg | ORAL_TABLET | Freq: Two times a day (BID) | ORAL | Status: DC | PRN
  Filled 2016-07-08: qty 1

## 2016-07-08 MED ORDER — PANTOPRAZOLE SODIUM 40 MG PO TBEC
40.0000 mg | DELAYED_RELEASE_TABLET | Freq: Every day | ORAL | Status: DC
  Administered 2016-07-08 – 2016-07-09 (×2): 40 mg via ORAL
  Filled 2016-07-08 (×2): qty 1

## 2016-07-08 MED ORDER — SODIUM CHLORIDE 0.9 % IV SOLN: 250.0000 mL | INTRAVENOUS | Status: DC | PRN

## 2016-07-08 MED ORDER — LACTATED RINGERS IV BOLUS (SEPSIS): 1000.0000 mL | Freq: Three times a day (TID) | INTRAVENOUS | Status: AC | PRN

## 2016-07-08 MED ORDER — ACETAMINOPHEN 500 MG PO TABS
1000.0000 mg | ORAL_TABLET | Freq: Three times a day (TID) | ORAL | Status: DC
  Administered 2016-07-08 – 2016-07-09 (×6): 1000 mg via ORAL
  Filled 2016-07-08 (×6): qty 2

## 2016-07-08 MED ORDER — ADULT MULTIVITAMIN W/MINERALS CH: 1.0000 | ORAL_TABLET | Freq: Every day | ORAL | Status: DC

## 2016-07-08 MED ORDER — SODIUM CHLORIDE 0.9% FLUSH
3.0000 mL | Freq: Two times a day (BID) | INTRAVENOUS | Status: DC
  Administered 2016-07-09 – 2016-07-15 (×5): 3 mL via INTRAVENOUS

## 2016-07-08 MED ORDER — OXYCODONE HCL 5 MG PO TABS
5.0000 mg | ORAL_TABLET | ORAL | Status: DC | PRN
  Administered 2016-07-09: 5 mg via ORAL
  Administered 2016-07-09: 10 mg via ORAL
  Administered 2016-07-09: 5 mg via ORAL
  Filled 2016-07-08: qty 1
  Filled 2016-07-08: qty 2
  Filled 2016-07-08 (×2): qty 1

## 2016-07-08 MED ORDER — BISMUTH SUBSALICYLATE 262 MG/15ML PO SUSP: 30.0000 mL | Freq: Three times a day (TID) | ORAL | Status: DC | PRN

## 2016-07-08 MED ORDER — PSYLLIUM 95 % PO PACK
1.0000 | PACK | Freq: Every day | ORAL | Status: DC
  Administered 2016-07-08 – 2016-07-09 (×2): 1 via ORAL
  Filled 2016-07-08 (×2): qty 1

## 2016-07-08 NOTE — Consult Note (Signed)
[facility] Nurse ostomy follow up Stoma type/location: RLQ loop ileostomy Stomal assessment/size: 1 and 1/2 inch round with red rubber bridge in place Peristomal assessment: intact, clear Treatment options for stomal/peristomal skin: skin barrier ring Output: green/brown liquid effluent Ostomy pouching: 1pc.flat flexible pouch with skin barrier ring Education provided: Sister observed the pouching regimen. Asking appropriate questions. Enrolled patient in [facility] Start Discharge program: Yes  [facility] Nurse wound consult note Reason for Consult: midline surgical wound left open to heal by secondary intention.  Routine NPWT dressing change. Wound type:surgical Pressure Ulcer POA: No Measurement:10cm x 3cm x 3cm (decreased since initial assessment on 07/03/16 Wound bed: red, dry with islands of subcutaneous fat Drainage (amount, consistency, odor) small amount serous Periwound:intact.  Drain wick at 7 o'clock dry.   Dressing procedure/placement/frequency: NPWT device removed, wound cleansed.  1 piece of black foam used to obliterate dead space, covered with drape and an immediate seal achieved.   Will place note for Dr. Lonzo Cloud to see if red rubber bridge can be removed during pouch change.  If you agree, please include in your note.  [facility] nursing team will follow, and will remain available to this patient, the nursing, surgical and medical teams.   Thanks, Maudie Flakes, MSN, RN, [place], Arther Abbott  Pager# [phone]

## 2016-07-08 NOTE — Progress Notes (Signed)
[address] [place], [place] [address] Phone: [phone] FAX: [phone]   [name] [id number] 01-30-1956  CARE TEAM:  PCP: [name], NP  Outpatient Care Team: Patient Care Team: [name], NP as PCP - General (Obstetrics and Gynecology) [name], MD as Consulting Physician (General Surgery) [name], MD as Consulting Physician (Gastroenterology) [name], MD as Consulting Physician (Gynecology) Tania Ade, RN as Registered Nurse  Inpatient Treatment Team: Treatment Team: Attending Provider: [name], MD; Technician: Sueanne Margarita, NT; Registered Nurse: Bailey Mech, RN; Registered Nurse: Mertha Baars, RN; Registered Nurse: Arminda Resides, RN  Problem List:   Principal Problem:   Rectal cancer s/p redo robotic LAR with coloanal anastomosis 07/01/2016 Active Problems:   Ovarian cyst s/p BSO 07/01/2016   Family history of ovarian cancer s/p BSO 07/01/2016   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   Uterine fibromyoma   Obesity   Hypokalemia   Hypomagnesemia  SURGERY 07/01/2016  POST-OPERATIVE DIAGNOSIS:    RECTAL CANCER LEFT OVARIAN MASS STRONG FAMILY HISTORY OF OVARIAN CANCER  PROCEDURE:  : XI ROBOTIC ASSISTED REDO LOWER ANTERIOR  RECTO-SIGMOID RESECTION  COLOANAL Handsewn ANASTOMOSIS  SPLENIC FLEXURE MOBILIZATION BILATERAL SALPINGO-OOPHORECTOMY LAPAROSCOPIC & ROBOTIC LYSIS OF ADHESIONS X 2 HOURS (33% of case) DIVERTING LOOP ILEOSTOMY Cystoscopy with placement of bilateral lighted ureteral stents    SURGEON:  [name], MD  6 Days Post-Op  07/02/2016  POST-OPERATIVE DIAGNOSIS:  DELAYED SMALL BOWEL PERFORATION  PROCEDURE:   LAPAROSCOPY DIAGNOSTIC SMALL BOWEL RESECTION SEROSAL REPAIR SMALL BOWEL  OMENTOPEXY  Surgeon(s): [name], MD  Assessment  Stabilizing  Plan:  -Adv to pureed/fulls -surgical pelvic & RUQ drainage - keep x 10  days minimum -IV ABx x 5/5 days - follow off now -wean off IVF -low K & Mag - improved.  follow -pain control. Try PO -pathology with out +LN nor residual tumor.   -Ostomy care -antidiarrheal regimen PRN.  If high output, may need PICC & IVF 1L LR or NS q MWF x 6 weeks -wound vac to help wound close down - watch LUQ port w former bile.  Pack w wick -VTE prophylaxis- SCDs, etc -mobilize as tolerated to help recovery  I updated the patient's status to the patient & sister.  Recommendations were made.  Questions were answered.  She expressed understanding & appreciation.   [name], M.D., F.A.C.S. Gastrointestinal and Minimally Invasive Surgery [facility] [address] [place], [place] [address] [phone] Main / Paging   07/08/2016  Subjective:  Walking better Bathed Sister in room Sore Tol clears - hates the taste.  No nausea.    Objective:  Vital signs:  Vitals:   07/07/16 0459 07/07/16 1624 07/07/16 2157 07/08/16 0540  BP: 140/85 115/70 110/74 118/65  Pulse: 94 79 83 81  Resp: _0 Temp: 97.7 F (36.5 C) 97.6 F (36.4 C) 98.2 F (36.8 C) 97.5 F (36.4 C)  TempSrc: Oral Oral Oral Oral  SpO2: 98% 98% 99% 100%  Weight: 85 kg (187 lb 4.8 oz)     Height:        Last BM Date:  (pta)  Intake/Output   Yesterday:  11/07 0701 - 11/08 0700 In: 1619.2 [P.O.:300; I.V.:1219.2; IV Piggyback:100] Out: 4196 [Urine:3000; Drains:130; Stool:285] This shift:  Total I/O In: 879.2 [P.O.:60; I.V.:819.2] Out: 2229 [Urine:1600; Drains:60; Stool:100]  Bowel function:  Flatus: YES  BM:  YES - dark syrupy bilious  RLQ drain (pelvic):Serosanguinous  LUQ drain (to RUQ) - serous   Physical Exam:  General: Pt awake/alert/oriented x4 in No acute distress.  Alert, smiling Eyes: PERRL, normal EOM.  Sclera clear.  No icterus Neuro: CN II-XII intact w/o focal sensory/motor deficits. Lymph: No head/neck/groin  lymphadenopathy Psych:  No delerium/psychosis/paranoia HENT: Normocephalic, Mucus membranes moist.  No thrush Neck: Supple, No tracheal deviation Chest: No chest wall pain w good excursion CV:  Pulses intact.  Regular rhythm MS: Normal AROM mjr joints.  No obvious deformity Abdomen: Soft.  Nondistended.  Mildly tender at incisions only.  R sided ileostomy flat pink w scant gas in bag.   No evidence of peritonitis.  No incarcerated hernias. Ext:  SCDs BLE.  No mjr edema.  No cyanosis Skin: No petechiae / purpura  Results:   Diagnosis 1. Ovary and fallopian tube, right - BENIGN FALLOPIAN TUBE. - OVARIAN TISSUE NOT IDENTIFIED. 2. Colon, segmental resection for tumor, recto-sigmoid - BENIGN COLONIC MUCOSA WITH DIVERTICULOSIS. - FOCAL MESORECTAL INFLAMMATION AND REACTIVE CHANGES. - NINE OF NINE LYMPH NODES NEGATIVE FOR MALIGNANCY (0/9). - NO DYSPLASIA OR MALIGNANCY. - SEE COMMENT. 3. Adnexa - ovary +/- tube, non-neoplastic, left - BENIGN SEROUS CYSTADENOMA. - BENIGN FALLOPIAN TUBE WITH PARATUBAL CYSTS. - FIBROUS ADHESIONS. Microscopic Comment 2. The fat was cleared and the total lymph node count is nine for this specimen. [name] MD Pathologist, Electronic Signature (Case signed 07/06/2016) Specimen [name] and Clinical Information Specimen(s) Obtained: 1. Ovary and fallopian tube, right 2. Colon, segmental resection for tumor, recto-sigmoid 3. Adnexa - ovary +/- tube, non-neoplastic, left Specimen Clinical Information 1. rectal cancer [rd] 1 of 2 FINAL for [name], [name] ([id number]) [name] 1. Received in formalin, clinically labeled "right fallopian tube and ovary", is a 4.8 cm in length and 0.3 cm in diameter segment of fallopian tube with possible fimbria at one end. There is pink to hyperemic serosa with few scattered adhesions and unremarkable cut surfaces. There is also a 2.0 x 1.8 x 0.4 cm aggregate of tan red to dark red soft to rubbery tissue with unremarkable  cut surfaces. No ovarian tissue is identified. Block Summary: A = sections of tube including presumed distal end B = remaining tissue fragments Total: Two blocks 2. Received in formalin is a 19 cm in length portion of intestine, clinically recto-sigmoid, suture at proximal margin. The distal margin is irregular. Mesorectum is sparse and incomplete. On opening, there is tan pink smooth, soft mucosa with normal intestinal folds. No mass lesions are identified. The wall of the rectum is up to 0.8 cm thick, and has tan-white dense fibrotic tissue. The rectum and distal sigmoid also have several unperforated diverticula. Found within the fat are six possible lymph nodes ranging from 0.1 to 0.4 cm. Following fixation of fat in clearing solution, three additional possible nodes are submitted. The external surface of the rectum is inked black, and the distal margin is inked orange. Block Summary: A = shave of proximal margin B-F = radial sections of distal margin Mcpherson-Q = thickened wall of rectum R = three possible nodes S = three possible nodes T = three possible nodes found after fat clearing Total: 20 blocks submitted 3. Received in formalin is a 6.2 x 4.2 x 3.5 cm ovary, clinically left, which has a fimbriated segment of fallopian tube adherent to one aspect. There are also scattered fibromembranous and fatty adhesions. On sectioning, the ovary contains a 4.0 cm smooth serous cyst. The  remaining cut surfaces of ovary are unremarkable. The segment of tube is 7.4 cm in length, up to 0.4 cm in diameter, has a pink to hyperemic serosa with few scattered adhesions, and unremarkable cut surfaces. Block Summary: A-C = sections of ovary including cyst D = sections of tube E = distal end of tube Total: Five blocks submitted (SW:ds 07/02/16) Report signed out from the following location(s) Technical component and interpretation was performed at [facility],  [place], [place] [address]. CLIA #: [id number], 2 of 2  Labs: Results for orders placed or performed during the hospital encounter of 07/01/16 (from the past 48 hour(s))  Urinalysis, Routine w reflex microscopic (not at [facility])     Status: None   Collection Time: 07/08/16  1:50 AM  Result Value Ref Range   Color, Urine YELLOW YELLOW   APPearance CLEAR CLEAR   Specific Gravity, Urine 1.006 1.005 - 1.030   pH 7.0 5.0 - 8.0   Glucose, UA NEGATIVE NEGATIVE mg/dL   Hgb urine dipstick NEGATIVE NEGATIVE   Bilirubin Urine NEGATIVE NEGATIVE   Ketones, ur NEGATIVE NEGATIVE mg/dL   Protein, ur NEGATIVE NEGATIVE mg/dL   Nitrite NEGATIVE NEGATIVE   Leukocytes, UA NEGATIVE NEGATIVE    Comment: MICROSCOPIC NOT DONE ON URINES WITH NEGATIVE PROTEIN, BLOOD, LEUKOCYTES, NITRITE, OR GLUCOSE <1000 mg/dL.  Creatinine, serum     Status: None   Collection Time: 07/08/16  5:30 AM  Result Value Ref Range   Creatinine, Ser 0.70 0.44 - 1.00 mg/dL   GFR calc non Af Amer >60 >60 mL/min   GFR calc Af Amer >60 >60 mL/min    Comment: (NOTE) The eGFR has been calculated using the CKD EPI equation. This calculation has not been validated in all clinical situations. eGFR's persistently <60 mL/min signify possible Chronic Kidney Disease.   Potassium     Status: None   Collection Time: 07/08/16  5:30 AM  Result Value Ref Range   Potassium 4.0 3.5 - 5.1 mmol/L    Imaging / Studies: No results found.  Medications / Allergies: per chart  Antibiotics: Anti-infectives    Start     Dose/Rate Route Frequency Ordered Stop   07/03/16 0000  piperacillin-tazobactam (ZOSYN) IVPB 3.375 Mcpherson     3.375 Mcpherson 12.5 mL/hr over 240 Minutes Intravenous Every 8 hours 07/02/16 1910 07/07/16 1933   07/02/16 2000  piperacillin-tazobactam (ZOSYN) IVPB 3.375 Mcpherson  Status:  Discontinued     3.375 Mcpherson 12.5 mL/hr over 240 Minutes Intravenous Every 8 hours 07/02/16 1910 07/02/16 1910   07/02/16 1600  piperacillin-tazobactam (ZOSYN) IVPB 3.375 Mcpherson      3.375 Mcpherson 100 mL/hr over 30 Minutes Intravenous  Once 07/02/16 1602 07/02/16 1545   07/02/16 1515  clindamycin (CLEOCIN) 900 mg, gentamicin (GARAMYCIN) 240 mg in sodium chloride 0.9 % 1,000 mL for intraperitoneal lavage      Intraperitoneal To Surgery 07/02/16 1505 07/02/16 1653   07/02/16 1000  clindamycin (CLEOCIN) 900 mg, gentamicin (GARAMYCIN) 240 mg in sodium chloride 0.9 % 1,000 mL for intraperitoneal lavage  Status:  Discontinued      Intraperitoneal To Surgery 06/30/16 1345 06/30/16 1347   07/01/16 2200  cefoTEtan (CEFOTAN) 2 Mcpherson in dextrose 5 % 50 mL IVPB     2 Mcpherson 100 mL/hr over 30 Minutes Intravenous Every 12 hours 07/01/16 1804 07/01/16 2210   07/01/16 1024  clindamycin (CLEOCIN) 900 mg, gentamicin (GARAMYCIN) 240 mg in sodium chloride 0.9 % 1,000 mL for intraperitoneal lavage  Status:  Discontinued       As needed 07/01/16 1025 07/01/16 1605   07/01/16 0700  clindamycin (CLEOCIN) 900 mg, gentamicin (GARAMYCIN) 240 mg in sodium chloride 0.9 % 1,000 mL for intraperitoneal lavage  Status:  Discontinued      Intraperitoneal To Surgery 06/30/16 1334 06/30/16 1344   07/01/16 0700  clindamycin (CLEOCIN) 900 mg, gentamicin (GARAMYCIN) 240 mg in sodium chloride 0.9 % 1,000 mL for intraperitoneal lavage  Status:  Discontinued      Intraperitoneal To Surgery 06/30/16 1347 07/01/16 1741   07/01/16 0649  cefoTEtan (CEFOTAN) 2 Mcpherson in dextrose 5 % 50 mL IVPB     2 Mcpherson 100 mL/hr over 30 Minutes Intravenous On call to O.R. 07/01/16 6728 07/01/16 0835   07/01/16 0649  neomycin (MYCIFRADIN) tablet 1,000 mg  Status:  Discontinued     1,000 mg Oral 3 times per day 07/01/16 0650 07/01/16 0713   07/01/16 0649  metroNIDAZOLE (FLAGYL) tablet 1,000 mg  Status:  Discontinued     1,000 mg Oral 3 times per day 07/01/16 0650 07/01/16 9791        Note: Portions of this report may have been transcribed using voice recognition software. Every effort was made to ensure accuracy; however, inadvertent computerized  transcription errors may be present.   Any transcriptional errors that result from this process are unintentional.     [name], M.D., F.A.C.S. Gastrointestinal and Minimally Invasive Surgery [facility] [address], [place], [place] [address] [phone] Main / Paging   07/08/2016

## 2016-07-09 LAB — CBC
HEMATOCRIT: 24.2 % — AB (ref 36.0–46.0)
Hemoglobin: 7.8 g/dL — ABNORMAL LOW (ref 12.0–15.0)
MCH: 28.7 pg (ref 26.0–34.0)
MCHC: 32.2 g/dL (ref 30.0–36.0)
MCV: 89 fL (ref 78.0–100.0)
Platelets: 361 10*3/uL (ref 150–400)
RBC: 2.72 MIL/uL — ABNORMAL LOW (ref 3.87–5.11)
RDW: 14.9 % (ref 11.5–15.5)
WBC: 8.8 10*3/uL (ref 4.0–10.5)

## 2016-07-09 LAB — BASIC METABOLIC PANEL
Anion gap: 8 (ref 5–15)
BUN: 6 mg/dL (ref 6–20)
CHLORIDE: 107 mmol/L (ref 101–111)
CO2: 24 mmol/L (ref 22–32)
Calcium: 8.6 mg/dL — ABNORMAL LOW (ref 8.9–10.3)
Creatinine, Ser: 0.63 mg/dL (ref 0.44–1.00)
GFR calc Af Amer: >60 mL/min (ref >60)
GFR calc non Af Amer: >60 mL/min (ref >60)
Glucose, Bld: 94 mg/dL (ref 65–99)
POTASSIUM: 3.7 mmol/L (ref 3.5–5.1)
SODIUM: 139 mmol/L (ref 135–145)

## 2016-07-09 NOTE — Progress Notes (Signed)
[place]  [address] [place], [place] [address] Phone: [phone] FAX: [phone]   CHUDNEY SCHEFFLER [id number] Jun 23, 1956  CARE TEAM:  PCP: [name], NP  Outpatient Care Team: Patient Care Team: [name], NP as PCP - General (Obstetrics and Gynecology) [name], MD as Consulting Physician (General Surgery) [name], MD as Consulting Physician (Gastroenterology) [name], MD as Consulting Physician (Gynecology) Tania Ade, RN as Registered Nurse  Inpatient Treatment Team: Treatment Team: Attending Provider: [name], MD; Technician: Sueanne Margarita, NT; Registered Nurse: Bailey Mech, RN; Technician: Tenna Child, NT; Registered Nurse: Mertha Baars, RN  Problem List:   Principal Problem:   Rectal cancer s/p redo robotic LAR with coloanal anastomosis 07/01/2016 Active Problems:   Ovarian cyst s/p BSO 07/01/2016   Family history of ovarian cancer s/p BSO 07/01/2016   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   Uterine fibromyoma   Obesity   Hypokalemia   Hypomagnesemia  SURGERY 07/01/2016  POST-OPERATIVE DIAGNOSIS:    RECTAL CANCER LEFT OVARIAN MASS STRONG FAMILY HISTORY OF OVARIAN CANCER  PROCEDURE:  : XI ROBOTIC ASSISTED REDO LOWER ANTERIOR  RECTO-SIGMOID RESECTION  COLOANAL Handsewn ANASTOMOSIS  SPLENIC FLEXURE MOBILIZATION BILATERAL SALPINGO-OOPHORECTOMY LAPAROSCOPIC & ROBOTIC LYSIS OF ADHESIONS X 2 HOURS (33% of case) DIVERTING LOOP ILEOSTOMY Cystoscopy with placement of bilateral lighted ureteral stents    SURGEON:  [name], MD  7 Days Post-Op  07/02/2016  POST-OPERATIVE DIAGNOSIS:  DELAYED SMALL BOWEL PERFORATION  PROCEDURE:   LAPAROSCOPY DIAGNOSTIC SMALL BOWEL RESECTION SEROSAL REPAIR SMALL BOWEL  OMENTOPEXY  Surgeon(s): [name], MD  Assessment  Stabilizing  Plan:  -Keep on liquids -keep x 10 days minimum for surgical pelvic & RUQ  drains -IV ABx x 5/5 days - follow off now -wean off IVF -pain control.  -pathology with out +LN nor residual tumor.   -Ostomy care.  OK to remove -antidiarrheal regimen PRN.  If high output, may need PICC & IVF 1L LR or NS q MWF x 6 weeks -wound vac to help wound close down - watch LUQ port w former bile.  Pack w wick -VTE prophylaxis- SCDs, etc -mobilize as tolerated to help recovery  I updated the patient's status to the patient & sister.  Recommendations were made.  Questions were answered.  She expressed understanding & appreciation.   [name], M.D., F.A.C.S. Gastrointestinal and Minimally Invasive Surgery [facility] [address], [place], [place] [address] [phone] Main / Paging   07/09/2016  Subjective:  Walking Nauseated with pureed Sister in room    Objective:  Vital signs:  Vitals:   07/08/16 0540 07/08/16 1557 07/08/16 2033 07/09/16 0529  BP: 118/65 134/78 (!) 135/91 128/67  Pulse: 81 75 76 88  Resp: 15 16 16 16   Temp: 97.5 F (36.4 C) 98.2 F (36.8 C) 98.9 F (37.2 C) 98.9 F (37.2 C)  TempSrc: Oral Oral Oral Oral  SpO2: 100% 99% 95% 97%  Weight:      Height:        Last BM Date:  (pta)  Intake/Output   Yesterday:  11/08 0701 - 11/09 0700 In: 380 [P.O.:240; I.V.:60; IV Piggyback:80] Out: 2335 [Urine:2150; Drains:135; Stool:50] This shift:  No intake/output data recorded.  Bowel function:  Flatus: Scant  BM:  YES - dark syrupy bilious  RLQ drain (pelvic):Serosanguinous  LUQ drain (to RUQ) - serous   Physical Exam:  General: Pt awake/alert/oriented  x4 in No acute distress.  Alert, smiling Eyes: PERRL, normal EOM.  Sclera clear.  No icterus Neuro: CN II-XII intact w/o focal sensory/motor deficits. Lymph: No head/neck/groin lymphadenopathy Psych:  No delerium/psychosis/paranoia HENT: Normocephalic, Mucus membranes moist.  No thrush Neck: Supple, No tracheal deviation Chest: No chest  wall pain w good excursion CV:  Pulses intact.  Regular rhythm MS: Normal AROM mjr joints.  No obvious deformity Abdomen: Soft. Obese Mildy distended.  Mildly tender at incisions only.  R sided ileostomy flat pink w scant gas in bag.   RUQ port with drainage - I repacked.  No evidence of peritonitis.  No incarcerated hernias. Ext:  SCDs BLE.  No mjr edema.  No cyanosis Skin: No petechiae / purpura  Results:   Diagnosis 1. Ovary and fallopian tube, right - BENIGN FALLOPIAN TUBE. - OVARIAN TISSUE NOT IDENTIFIED. 2. Colon, segmental resection for tumor, recto-sigmoid - BENIGN COLONIC MUCOSA WITH DIVERTICULOSIS. - FOCAL MESORECTAL INFLAMMATION AND REACTIVE CHANGES. - NINE OF NINE LYMPH NODES NEGATIVE FOR MALIGNANCY (0/9). - NO DYSPLASIA OR MALIGNANCY. - SEE COMMENT. 3. Adnexa - ovary +/- tube, non-neoplastic, left - BENIGN SEROUS CYSTADENOMA. - BENIGN FALLOPIAN TUBE WITH PARATUBAL CYSTS. - FIBROUS ADHESIONS. Microscopic Comment 2. The fat was cleared and the total lymph node count is nine for this specimen. [name] MD Pathologist, Electronic Signature (Case signed 07/06/2016) Specimen Grant Henkes and Clinical Information Specimen(s) Obtained: 1. Ovary and fallopian tube, right 2. Colon, segmental resection for tumor, recto-sigmoid 3. Adnexa - ovary +/- tube, non-neoplastic, left Specimen Clinical Information 1. rectal cancer [rd] 1 of 2 FINAL for Pinkerton, Luara G ([id number]) Shacoria Latif 1. Received in formalin, clinically labeled "right fallopian tube and ovary", is a 4.8 cm in length and 0.3 cm in diameter segment of fallopian tube with possible fimbria at one end. There is pink to hyperemic serosa with few scattered adhesions and unremarkable cut surfaces. There is also a 2.0 x 1.8 x 0.4 cm aggregate of tan red to dark red soft to rubbery tissue with unremarkable cut surfaces. No ovarian tissue is identified. Block Summary: A = sections of tube including presumed distal end B =  remaining tissue fragments Total: Two blocks 2. Received in formalin is a 19 cm in length portion of intestine, clinically recto-sigmoid, suture at proximal margin. The distal margin is irregular. Mesorectum is sparse and incomplete. On opening, there is tan pink smooth, soft mucosa with normal intestinal folds. No mass lesions are identified. The wall of the rectum is up to 0.8 cm thick, and has tan-white dense fibrotic tissue. The rectum and distal sigmoid also have several unperforated diverticula. Found within the fat are six possible lymph nodes ranging from 0.1 to 0.4 cm. Following fixation of fat in clearing solution, three additional possible nodes are submitted. The external surface of the rectum is inked black, and the distal margin is inked orange. Block Summary: A = shave of proximal margin B-F = radial sections of distal margin G-Q = thickened wall of rectum R = three possible nodes S = three possible nodes T = three possible nodes found after fat clearing Total: 20 blocks submitted 3. Received in formalin is a 6.2 x 4.2 x 3.5 cm ovary, clinically left, which has a fimbriated segment of fallopian tube adherent to one aspect. There are also scattered fibromembranous and fatty adhesions. On sectioning, the ovary contains a 4.0 cm smooth serous cyst. The remaining cut surfaces of ovary are unremarkable. The segment of tube is 7.4 cm in  length, up to 0.4 cm in diameter, has a pink to hyperemic serosa with few scattered adhesions, and unremarkable cut surfaces. Block Summary: A-C = sections of ovary including cyst D = sections of tube E = distal end of tube Total: Five blocks submitted (SW:ds 07/02/16) Report signed out from the following location(s) Technical component and interpretation was performed at [facility], [place], [place] [address]. CLIA #: [id number], 2 of 2  Labs: Results for orders placed or performed during the hospital  encounter of 07/01/16 (from the past 48 hour(s))  Urinalysis, Routine w reflex microscopic (not at [facility])     Status: None   Collection Time: 07/08/16  1:50 AM  Result Value Ref Range   Color, Urine YELLOW YELLOW   APPearance CLEAR CLEAR   Specific Gravity, Urine 1.006 1.005 - 1.030   pH 7.0 5.0 - 8.0   Glucose, UA NEGATIVE NEGATIVE mg/dL   Hgb urine dipstick NEGATIVE NEGATIVE   Bilirubin Urine NEGATIVE NEGATIVE   Ketones, ur NEGATIVE NEGATIVE mg/dL   Protein, ur NEGATIVE NEGATIVE mg/dL   Nitrite NEGATIVE NEGATIVE   Leukocytes, UA NEGATIVE NEGATIVE    Comment: MICROSCOPIC NOT DONE ON URINES WITH NEGATIVE PROTEIN, BLOOD, LEUKOCYTES, NITRITE, OR GLUCOSE <1000 mg/dL.  Creatinine, serum     Status: None   Collection Time: 07/08/16  5:30 AM  Result Value Ref Range   Creatinine, Ser 0.70 0.44 - 1.00 mg/dL   GFR calc non Af Amer >60 >60 mL/min   GFR calc Af Amer >60 >60 mL/min    Comment: (NOTE) The eGFR has been calculated using the CKD EPI equation. This calculation has not been validated in all clinical situations. eGFR's persistently <60 mL/min signify possible Chronic Kidney Disease.   Potassium     Status: None   Collection Time: 07/08/16  5:30 AM  Result Value Ref Range   Potassium 4.0 3.5 - 5.1 mmol/L  Basic metabolic panel     Status: Abnormal   Collection Time: 07/09/16  4:26 AM  Result Value Ref Range   Sodium 139 135 - 145 mmol/L   Potassium 3.7 3.5 - 5.1 mmol/L   Chloride 107 101 - 111 mmol/L   CO2 24 22 - 32 mmol/L   Glucose, Bld 94 65 - 99 mg/dL   BUN 6 6 - 20 mg/dL   Creatinine, Ser 0.63 0.44 - 1.00 mg/dL   Calcium 8.6 (L) 8.9 - 10.3 mg/dL   GFR calc non Af Amer >60 >60 mL/min   GFR calc Af Amer >60 >60 mL/min    Comment: (NOTE) The eGFR has been calculated using the CKD EPI equation. This calculation has not been validated in all clinical situations. eGFR's persistently <60 mL/min signify possible Chronic Kidney Disease.    Anion gap 8 5 - 15  CBC      Status: Abnormal   Collection Time: 07/09/16  4:26 AM  Result Value Ref Range   WBC 8.8 4.0 - 10.5 K/uL   RBC 2.72 (L) 3.87 - 5.11 MIL/uL   Hemoglobin 7.8 (L) 12.0 - 15.0 g/dL   HCT 24.2 (L) 36.0 - 46.0 %   MCV 89.0 78.0 - 100.0 fL   MCH 28.7 26.0 - 34.0 pg   MCHC 32.2 30.0 - 36.0 g/dL   RDW 14.9 11.5 - 15.5 %   Platelets 361 150 - 400 K/uL    Imaging / Studies: No results found.  Medications / Allergies: per chart  Antibiotics: Anti-infectives  Start     Dose/Rate Route Frequency Ordered Stop   07/03/16 0000  piperacillin-tazobactam (ZOSYN) IVPB 3.375 g     3.375 g 12.5 mL/hr over 240 Minutes Intravenous Every 8 hours 07/02/16 1910 07/07/16 1933   07/02/16 2000  piperacillin-tazobactam (ZOSYN) IVPB 3.375 g  Status:  Discontinued     3.375 g 12.5 mL/hr over 240 Minutes Intravenous Every 8 hours 07/02/16 1910 07/02/16 1910   07/02/16 1600  piperacillin-tazobactam (ZOSYN) IVPB 3.375 g     3.375 g 100 mL/hr over 30 Minutes Intravenous  Once 07/02/16 1602 07/02/16 1545   07/02/16 1515  clindamycin (CLEOCIN) 900 mg, gentamicin (GARAMYCIN) 240 mg in sodium chloride 0.9 % 1,000 mL for intraperitoneal lavage      Intraperitoneal To Surgery 07/02/16 1505 07/02/16 1653   07/02/16 1000  clindamycin (CLEOCIN) 900 mg, gentamicin (GARAMYCIN) 240 mg in sodium chloride 0.9 % 1,000 mL for intraperitoneal lavage  Status:  Discontinued      Intraperitoneal To Surgery 06/30/16 1345 06/30/16 1347   07/01/16 2200  cefoTEtan (CEFOTAN) 2 g in dextrose 5 % 50 mL IVPB     2 g 100 mL/hr over 30 Minutes Intravenous Every 12 hours 07/01/16 1804 07/01/16 2210   07/01/16 1024  clindamycin (CLEOCIN) 900 mg, gentamicin (GARAMYCIN) 240 mg in sodium chloride 0.9 % 1,000 mL for intraperitoneal lavage  Status:  Discontinued       As needed 07/01/16 1025 07/01/16 1605   07/01/16 0700  clindamycin (CLEOCIN) 900 mg, gentamicin (GARAMYCIN) 240 mg in sodium chloride 0.9 % 1,000 mL for intraperitoneal lavage  Status:   Discontinued      Intraperitoneal To Surgery 06/30/16 1334 06/30/16 1344   07/01/16 0700  clindamycin (CLEOCIN) 900 mg, gentamicin (GARAMYCIN) 240 mg in sodium chloride 0.9 % 1,000 mL for intraperitoneal lavage  Status:  Discontinued      Intraperitoneal To Surgery 06/30/16 1347 07/01/16 1741   07/01/16 0649  cefoTEtan (CEFOTAN) 2 g in dextrose 5 % 50 mL IVPB     2 g 100 mL/hr over 30 Minutes Intravenous On call to O.R. 07/01/16 4287 07/01/16 0835   07/01/16 0649  neomycin (MYCIFRADIN) tablet 1,000 mg  Status:  Discontinued     1,000 mg Oral 3 times per day 07/01/16 0650 07/01/16 0713   07/01/16 0649  metroNIDAZOLE (FLAGYL) tablet 1,000 mg  Status:  Discontinued     1,000 mg Oral 3 times per day 07/01/16 0650 07/01/16 6811        Note: Portions of this report may have been transcribed using voice recognition software. Every effort was made to ensure accuracy; however, inadvertent computerized transcription errors may be present.   Any transcriptional errors that result from this process are unintentional.     [name], M.D., F.A.C.S. Gastrointestinal and Minimally Invasive Surgery [facility] [address], [place], [place] [address] [phone] Main / Paging   07/09/2016

## 2016-07-10 DIAGNOSIS — E43 Unspecified severe protein-calorie malnutrition: Secondary | ICD-10-CM | POA: Insufficient documentation

## 2016-07-10 MED ORDER — LACTATED RINGERS IV BOLUS (SEPSIS): 1000.0000 mL | Freq: Three times a day (TID) | INTRAVENOUS | Status: AC | PRN

## 2016-07-10 MED ORDER — PROMETHAZINE HCL 12.5 MG PO TABS
6.2500 mg | ORAL_TABLET | Freq: Four times a day (QID) | ORAL | Status: DC | PRN
  Filled 2016-07-10: qty 1

## 2016-07-10 MED ORDER — LACTATED RINGERS IV BOLUS (SEPSIS)
1000.0000 mL | Freq: Once | INTRAVENOUS | Status: AC
  Administered 2016-07-10: 1000 mL via INTRAVENOUS

## 2016-07-10 MED ORDER — LACTATED RINGERS IV SOLN
INTRAVENOUS | Status: DC
  Administered 2016-07-10: 75 mL/h via INTRAVENOUS

## 2016-07-10 MED ORDER — METOCLOPRAMIDE HCL 5 MG PO TABS: 5.0000 mg | ORAL_TABLET | Freq: Four times a day (QID) | ORAL | Status: DC | PRN

## 2016-07-10 MED ORDER — METOCLOPRAMIDE HCL 5 MG/ML IJ SOLN
5.0000 mg | Freq: Four times a day (QID) | INTRAMUSCULAR | Status: DC | PRN
  Administered 2016-07-10: 5 mg via INTRAVENOUS
  Filled 2016-07-10: qty 2

## 2016-07-10 MED ORDER — FENTANYL CITRATE (PF) 100 MCG/2ML IJ SOLN
25.0000 ug | INTRAMUSCULAR | Status: DC | PRN
Start: 2016-07-10 — End: 2016-07-14
  Administered 2016-07-10: 25 ug via INTRAVENOUS
  Administered 2016-07-10: 50 ug via INTRAVENOUS
  Administered 2016-07-10 (×3): 25 ug via INTRAVENOUS
  Administered 2016-07-11 – 2016-07-13 (×21): 50 ug via INTRAVENOUS
  Filled 2016-07-10 (×26): qty 2

## 2016-07-10 MED ORDER — PROMETHAZINE HCL 25 MG/ML IJ SOLN
6.2500 mg | INTRAMUSCULAR | Status: DC | PRN
  Filled 2016-07-10: qty 1

## 2016-07-10 MED ORDER — KCL IN DEXTROSE-NACL 40-5-0.45 MEQ/L-%-% IV SOLN
INTRAVENOUS | Status: DC
  Administered 2016-07-11 – 2016-07-13 (×6): via INTRAVENOUS
  Administered 2016-07-14: 75 mL/h via INTRAVENOUS
  Filled 2016-07-10 (×8): qty 1000

## 2016-07-10 MED ORDER — ONDANSETRON HCL 4 MG/2ML IJ SOLN
4.0000 mg | Freq: Four times a day (QID) | INTRAMUSCULAR | Status: DC | PRN
  Administered 2016-07-11 – 2016-07-13 (×3): 4 mg via INTRAVENOUS
  Filled 2016-07-10 (×4): qty 2

## 2016-07-10 MED ORDER — SODIUM CHLORIDE 0.9 % IV SOLN
8.0000 mg | Freq: Four times a day (QID) | INTRAVENOUS | Status: DC | PRN
  Filled 2016-07-10: qty 4

## 2016-07-10 NOTE — Progress Notes (Signed)
[address] [place], [place] [address] Phone: [phone] FAX: [phone]   [name] [id number] August 27, 1956  CARE TEAM:  PCP: [name], NP  Outpatient Care Team: Patient Care Team: [name], NP as PCP - General (Obstetrics and Gynecology) [name], MD as Consulting Physician (General Surgery) [name], MD as Consulting Physician (Gastroenterology) [name], MD as Consulting Physician (Gynecology) Tania Ade, RN as Registered Nurse  Inpatient Treatment Team: Treatment Team: Attending Provider: [name], MD; Technician: Sueanne Margarita, NT; Registered Nurse: Bailey Mech, RN; Technician: Tenna Child, NT; Registered Nurse: Mertha Baars, RN; Registered Nurse: Coralie Common, RN  Problem List:   Principal Problem:   Rectal cancer s/p redo robotic LAR with coloanal anastomosis 07/01/2016 Active Problems:   Ovarian cyst s/p BSO 07/01/2016   Family history of ovarian cancer s/p BSO 07/01/2016   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   Uterine fibromyoma   Obesity   Hypokalemia   Hypomagnesemia  SURGERY 07/01/2016  POST-OPERATIVE DIAGNOSIS:    RECTAL CANCER LEFT OVARIAN MASS STRONG FAMILY HISTORY OF OVARIAN CANCER  PROCEDURE:  : XI ROBOTIC ASSISTED REDO LOWER ANTERIOR  RECTO-SIGMOID RESECTION  COLOANAL Handsewn ANASTOMOSIS  SPLENIC FLEXURE MOBILIZATION BILATERAL SALPINGO-OOPHORECTOMY LAPAROSCOPIC & ROBOTIC LYSIS OF ADHESIONS X 2 HOURS (33% of case) DIVERTING LOOP ILEOSTOMY Cystoscopy with placement of bilateral lighted ureteral stents    SURGEON:  [name], MD  8 Days Post-Op  07/02/2016  POST-OPERATIVE DIAGNOSIS:  DELAYED SMALL BOWEL PERFORATION  PROCEDURE:   LAPAROSCOPY DIAGNOSTIC SMALL BOWEL RESECTION SEROSAL REPAIR SMALL BOWEL  OMENTOPEXY  Surgeon(s): [name], MD  Assessment  FAir   Plan:  -switch pain/nausea  meds -Keep on liquids until ileus reliably resolved with no nausea & regular ileostomy output -keep drains x 10 days minimum for surgical pelvic & RUQ drains -IV ABx x 5/5 days - follow off now -wean off IVF  -pathology with no LN involvement nor residual tumor.   -Ostomy care.  OK to remove -antidiarrheal regimen PRN.  If high output, may need PICC & IVF 1L LR or NS q MWF x 6 weeks -wound vac to help wound close down - watch LUQ port w former bile.  Pack w wick -VTE prophylaxis- SCDs, etc -mobilize as tolerated to help recovery  I updated the patient's status to the patient & sister.  Recommendations were made.  Questions were answered.  She expressed understanding & appreciation.   [name], M.D., F.A.C.S. Gastrointestinal and Minimally Invasive Surgery [facility] [address] [place], [place] [address] [phone] Main / Paging   07/10/2016  Subjective:  Walking Nauseated this AM Sister in room  RN Dawn in room  Objective:  Vital signs:  Vitals:   07/09/16 1300 07/09/16 1347 07/09/16 2157 07/10/16 0543  BP: 128/75  127/69 136/83  Pulse: 81  74 78  Resp:  16 16 16   Temp:  98.6 F (37 C) 98.2 F (36.8 C) 98.4 F (36.9 C)  TempSrc:  Oral Oral Oral  SpO2:  98% 98% 99%  Weight: 85 kg (187 lb 6.3 oz)     Height:        Last BM Date:  (pta)  Intake/Output   Yesterday:  11/09 0701 - 11/10 0700 In: 680 [P.O.:360; I.V.:240; IV Piggyback:80] Out: 3267 [Urine:1750; Drains:90; Stool:6] This shift:  No intake/output data recorded.  Bowel function:  Flatus: YES  BM:  YES - dark syrupy bilious  RLQ drain (pelvic):Serosanguinous  LUQ drain (to RUQ) - serous   Physical Exam:  General: Pt awake/alert/oriented x4 in No acute distress. Tired/nauseated Eyes: PERRL, normal EOM.  Sclera clear.  No icterus Neuro: CN II-XII intact w/o focal sensory/motor deficits. Lymph: No head/neck/groin lymphadenopathy Psych:  No  delerium/psychosis/paranoia HENT: Normocephalic, Mucus membranes moist.  No thrush Neck: Supple, No tracheal deviation Chest: No chest wall pain w good excursion CV:  Pulses intact.  Regular rhythm MS: Normal AROM mjr joints.  No obvious deformity Abdomen: Soft. Obese Mildy distended.  Mildly tender at incisions only.  R sided ileostomy flat pink w scant gas in bag.   RUQ port with drainage - I repacked.  No evidence of peritonitis.  No incarcerated hernias. Ext:  SCDs BLE.  No mjr edema.  No cyanosis Skin: No petechiae / purpura  Results:   Diagnosis 1. Ovary and fallopian tube, right - BENIGN FALLOPIAN TUBE. - OVARIAN TISSUE NOT IDENTIFIED. 2. Colon, segmental resection for tumor, recto-sigmoid - BENIGN COLONIC MUCOSA WITH DIVERTICULOSIS. - FOCAL MESORECTAL INFLAMMATION AND REACTIVE CHANGES. - NINE OF NINE LYMPH NODES NEGATIVE FOR MALIGNANCY (0/9). - NO DYSPLASIA OR MALIGNANCY. - SEE COMMENT. 3. Adnexa - ovary +/- tube, non-neoplastic, left - BENIGN SEROUS CYSTADENOMA. - BENIGN FALLOPIAN TUBE WITH PARATUBAL CYSTS. - FIBROUS ADHESIONS. Microscopic Comment 2. The fat was cleared and the total lymph node count is nine for this specimen. [name] MD Pathologist, Electronic Signature (Case signed 07/06/2016) Specimen Hamza Empson and Clinical Information Specimen(s) Obtained: 1. Ovary and fallopian tube, right 2. Colon, segmental resection for tumor, recto-sigmoid 3. Adnexa - ovary +/- tube, non-neoplastic, left Specimen Clinical Information 1. rectal cancer [rd] 1 of 2 FINAL for [facility], [name] ([id number]) Rodric Punch 1. Received in formalin, clinically labeled "right fallopian tube and ovary", is a 4.8 cm in length and 0.3 cm in diameter segment of fallopian tube with possible fimbria at one end. There is pink to hyperemic serosa with few scattered adhesions and unremarkable cut surfaces. There is also a 2.0 x 1.8 x 0.4 cm aggregate of tan red to dark red soft to rubbery tissue  with unremarkable cut surfaces. No ovarian tissue is identified. Block Summary: A = sections of tube including presumed distal end B = remaining tissue fragments Total: Two blocks 2. Received in formalin is a 19 cm in length portion of intestine, clinically recto-sigmoid, suture at proximal margin. The distal margin is irregular. Mesorectum is sparse and incomplete. On opening, there is tan pink smooth, soft mucosa with normal intestinal folds. No mass lesions are identified. The wall of the rectum is up to 0.8 cm thick, and has tan-white dense fibrotic tissue. The rectum and distal sigmoid also have several unperforated diverticula. Found within the fat are six possible lymph nodes ranging from 0.1 to 0.4 cm. Following fixation of fat in clearing solution, three additional possible nodes are submitted. The external surface of the rectum is inked black, and the distal margin is inked orange. Block Summary: A = shave of proximal margin B-F = radial sections of distal margin Mcpherson-Q = thickened wall of rectum R = three possible nodes S = three possible nodes T = three possible nodes found after fat clearing Total: 20 blocks submitted 3. Received in formalin is a 6.2 x 4.2 x 3.5 cm ovary, clinically left, which has a fimbriated segment of fallopian tube adherent to one aspect. There are also scattered fibromembranous and fatty adhesions. On sectioning, the ovary contains  a 4.0 cm smooth serous cyst. The remaining cut surfaces of ovary are unremarkable. The segment of tube is 7.4 cm in length, up to 0.4 cm in diameter, has a pink to hyperemic serosa with few scattered adhesions, and unremarkable cut surfaces. Block Summary: A-C = sections of ovary including cyst D = sections of tube E = distal end of tube Total: Five blocks submitted (SW:ds 07/02/16) Report signed out from the following location(s) Technical component and interpretation was performed at [facility] [place], [place], [place] [address]. CLIA #: [id number], 2 of 2  Labs: Results for orders placed or performed during the hospital encounter of 07/01/16 (from the past 48 hour(s))  Basic metabolic panel     Status: Abnormal   Collection Time: 07/09/16  4:26 AM  Result Value Ref Range   Sodium 139 135 - 145 mmol/L   Potassium 3.7 3.5 - 5.1 mmol/L   Chloride 107 101 - 111 mmol/L   CO2 24 22 - 32 mmol/L   Glucose, Bld 94 65 - 99 mg/dL   BUN 6 6 - 20 mg/dL   Creatinine, Ser 0.63 0.44 - 1.00 mg/dL   Calcium 8.6 (L) 8.9 - 10.3 mg/dL   GFR calc non Af Amer >60 >60 mL/min   GFR calc Af Amer >60 >60 mL/min    Comment: (NOTE) The eGFR has been calculated using the CKD EPI equation. This calculation has not been validated in all clinical situations. eGFR's persistently <60 mL/min signify possible Chronic Kidney Disease.    Anion gap 8 5 - 15  CBC     Status: Abnormal   Collection Time: 07/09/16  4:26 AM  Result Value Ref Range   WBC 8.8 4.0 - 10.5 K/uL   RBC 2.72 (L) 3.87 - 5.11 MIL/uL   Hemoglobin 7.8 (L) 12.0 - 15.0 Mcpherson/dL   HCT 24.2 (L) 36.0 - 46.0 %   MCV 89.0 78.0 - 100.0 fL   MCH 28.7 26.0 - 34.0 pg   MCHC 32.2 30.0 - 36.0 Mcpherson/dL   RDW 14.9 11.5 - 15.5 %   Platelets 361 150 - 400 K/uL    Imaging / Studies: No results found.  Medications / Allergies: per chart  Antibiotics: Anti-infectives    Start     Dose/Rate Route Frequency Ordered Stop   07/03/16 0000  piperacillin-tazobactam (ZOSYN) IVPB 3.375 Mcpherson     3.375 Mcpherson 12.5 mL/hr over 240 Minutes Intravenous Every 8 hours 07/02/16 1910 07/07/16 1933   07/02/16 2000  piperacillin-tazobactam (ZOSYN) IVPB 3.375 Mcpherson  Status:  Discontinued     3.375 Mcpherson 12.5 mL/hr over 240 Minutes Intravenous Every 8 hours 07/02/16 1910 07/02/16 1910   07/02/16 1600  piperacillin-tazobactam (ZOSYN) IVPB 3.375 Mcpherson     3.375 Mcpherson 100 mL/hr over 30 Minutes Intravenous  Once 07/02/16 1602 07/02/16 1545   07/02/16 1515  clindamycin (CLEOCIN) 900 mg, gentamicin  (GARAMYCIN) 240 mg in sodium chloride 0.9 % 1,000 mL for intraperitoneal lavage      Intraperitoneal To Surgery 07/02/16 1505 07/02/16 1653   07/02/16 1000  clindamycin (CLEOCIN) 900 mg, gentamicin (GARAMYCIN) 240 mg in sodium chloride 0.9 % 1,000 mL for intraperitoneal lavage  Status:  Discontinued      Intraperitoneal To Surgery 06/30/16 1345 06/30/16 1347   07/01/16 2200  cefoTEtan (CEFOTAN) 2 Mcpherson in dextrose 5 % 50 mL IVPB     2 Mcpherson 100 mL/hr over 30 Minutes Intravenous Every 12 hours 07/01/16 1804 07/01/16 2210  07/01/16 1024  clindamycin (CLEOCIN) 900 mg, gentamicin (GARAMYCIN) 240 mg in sodium chloride 0.9 % 1,000 mL for intraperitoneal lavage  Status:  Discontinued       As needed 07/01/16 1025 07/01/16 1605   07/01/16 0700  clindamycin (CLEOCIN) 900 mg, gentamicin (GARAMYCIN) 240 mg in sodium chloride 0.9 % 1,000 mL for intraperitoneal lavage  Status:  Discontinued      Intraperitoneal To Surgery 06/30/16 1334 06/30/16 1344   07/01/16 0700  clindamycin (CLEOCIN) 900 mg, gentamicin (GARAMYCIN) 240 mg in sodium chloride 0.9 % 1,000 mL for intraperitoneal lavage  Status:  Discontinued      Intraperitoneal To Surgery 06/30/16 1347 07/01/16 1741   07/01/16 0649  cefoTEtan (CEFOTAN) 2 Mcpherson in dextrose 5 % 50 mL IVPB     2 Mcpherson 100 mL/hr over 30 Minutes Intravenous On call to O.R. 07/01/16 1031 07/01/16 0835   07/01/16 0649  neomycin (MYCIFRADIN) tablet 1,000 mg  Status:  Discontinued     1,000 mg Oral 3 times per day 07/01/16 0650 07/01/16 0713   07/01/16 0649  metroNIDAZOLE (FLAGYL) tablet 1,000 mg  Status:  Discontinued     1,000 mg Oral 3 times per day 07/01/16 0650 07/01/16 2811        Note: Portions of this report may have been transcribed using voice recognition software. Every effort was made to ensure accuracy; however, inadvertent computerized transcription errors may be present.   Any transcriptional errors that result from this process are unintentional.     [name], M.D.,  F.A.C.S. Gastrointestinal and Minimally Invasive Surgery [facility] [address] [place], [place] [address] [phone] Main / Paging   07/10/2016

## 2016-07-10 NOTE — Progress Notes (Signed)
Dr Johney Maine paged through office after patient vomitted 500 cc.  Await response

## 2016-07-10 NOTE — Progress Notes (Signed)
Initial Nutrition Assessment  DOCUMENTATION CODES:   Severe malnutrition in context of acute illness/injury, Obesity unspecified  INTERVENTION:  Continue advancing diet per surgery as tolerated.  As it is now day 9 of inadequate intake, patient is severely malnourished, and patient is not tolerating FLD, recommend initiating TF through NG tube already present. Can advance tube post-pyloric via IR if there is concern for intolerance. Recommend initiating Vital AF 1.2 @ 20 ml/hr, then advancing by 15 ml/hr Q8hrs to goal of Vital AF 1.2 @ 65 ml/hr. Goal rate provides 1872 kcal, 117 grams protein, and 1264 ml H2O daily. Dr. Johney Maine aware of recommendations.  When diet advanced back to FLD recommend Ensure Enlive po BID to supplement intake.  Can provide education on ileostomy nutrition therapy as diet advanced and patient is less groggy.  NUTRITION DIAGNOSIS:   Inadequate oral intake related to poor appetite, vomiting, nausea, other (see comment) (abdominal pain) as evidenced by meal completion < 50%, per patient/family report, 9 percent weight loss over 2 months.  GOAL:   Patient will meet greater than or equal to 90% of their needs  MONITOR:   PO intake, Supplement acceptance, Diet advancement, Weight trends, Labs, I & O's  REASON FOR ASSESSMENT:   LOS    ASSESSMENT:   60 year old female with medical history significant for ovarian mass and rectal cancer (diagnosed 05/14/2016). Patient now s/p rectosigmoid resection, coloanal anastomosis, bilateral salpingo-oophorectomy, lysis of adhesions, and diverting loop ileostomy on 11/1.    -Patient had 550 ml emesis this morning and now with NG tube to low suction.  -No ileostomy output recorded today, but yesterday had 6 ml output. -Diet advanced to CLD on 11/6 and FLD on 11/9. Concern for possible post-op ileus.  Spoke with patient and daughter at bedside. Patient is very groggy (likely in setting of pain medications). She reports poor  appetite, N/V, and abdominal pain. She threw up this morning and is just back on ice chips at this time. Poor appetite was occurring PTA as well - for the past 2 months (daughter reports she felt sick after certain medications added in preparation for surgery). During admission she has only been able to tolerate small sips of potato soup, milkshake, or other liquids. Patient reports to RD she does not think she will be able to tolerate ONS even if diet advanced back to FLD.  UBW 205 lbs and patient reports she has been losing weight. Per chart, patient has lost 19 lbs (9% body weight) over 2 months, which is significant for time frame.  Medications reviewed and include: multivitamin with minerals, pantoprazole, LR @ 75 ml/hr, fentanyl injection PRN, Dilaudid injection PRN, Reglan injection Q6hrs PRN for N/V.  Labs reviewed: Potassium WNL today.  Nutrition-Focused physical exam completed. Findings are no fat depletion, moderate muscle depletion, and no edema. Moderate loss of muscle mass noted on temporalis muscle and clavicle bone region. Subcutaneous fat may be preventing ability to see loss of muscle mass in other areas. Patient reports she feels significantly weaker with the weight loss.  Patient meets criteria for severe acute malnutrition in setting of 9% weight loss over 2 months, intake </=50% estimated energy requirement for >/= 5 days, moderate muscle depletion.  Discussed with RN.   Diet Order:  Diet - low sodium heart healthy Diet full liquid Room service appropriate? Yes; Fluid consistency: Thin  Skin:  Wound (see comment) (Closed incision abdomen (wound vac) and rectum)  Last BM:  PTA  Height:   Ht  Readings from Last 1 Encounters:  07/01/16 5\' 3"  (1.6 m)    Weight:   Wt Readings from Last 1 Encounters:  07/09/16 187 lb 6.3 oz (85 kg)    Ideal Body Weight:  52.27 kg  BMI:  Body mass index is 33.19 kg/m.  Estimated Nutritional Needs:   Kcal:  1820-2100 kcal (MSJ x  1.3-1.5)  Protein:  110-130 grams (1.3-1.5 grams/kg)  Fluid:  1.8-2.1 L/day  EDUCATION NEEDS:   Education needs no appropriate at this time  Willey Blade, MS, RD, LDN Pager: [phone] After Hours Pager: [phone]
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
## 2016-07-11 NOTE — Progress Notes (Signed)
9 Days Post-Op  Subjective: Back pain (chronic) is main complaint. Feels better after NG tube placed yesterday. No major abdominal pain, just sore. Has some ileostomy output. A padded bed and chair.  Objective: Vital signs in last 24 hours: Temp:  [98.6 F (37 C)-99.2 F (37.3 C)] 99 F (37.2 C) (11/11 0517) Pulse Rate:  [87-99] 99 (11/11 0517) Resp:  [14-18] 18 (11/11 0517) BP: (128-152)/(69-83) 128/69 (11/11 0517) SpO2:  [98 %-100 %] 99 % (11/11 0517) Weight:  [80.8 kg (178 lb 1.6 oz)] 80.8 kg (178 lb 1.6 oz) (11/11 0546) Last BM Date: 07/10/16 (via ileostomy)  Intake/Output from previous day: 11/10 0701 - 11/11 0700 In: [id number] [P.O.:300; I.V.:1335] Out: [id number] [Urine:2000; Emesis/NG output:2000; Drains:70; Stool:600] Intake/Output this shift: No intake/output data recorded.  General appearance: alert, cooperative and no distress GI: No significant distention and nontender. JP drain with serosanguineous drainage. Bilious drainage and NG Incision/Wound: VAC dressing in place without erythema or unusual drainage  Lab Results:   Recent Labs  07/09/16 0426  WBC 8.8  HGB 7.8*  HCT 24.2*  PLT 361   BMET  Recent Labs  07/09/16 0426  NA 139  K 3.7  CL 107  CO2 24  GLUCOSE 94  BUN 6  CREATININE 0.63  CALCIUM 8.6*     Studies/Results: No results found.  Anti-infectives: Anti-infectives    Start     Dose/Rate Route Frequency Ordered Stop   07/03/16 0000  piperacillin-tazobactam (ZOSYN) IVPB 3.375 g     3.375 g 12.5 mL/hr over 240 Minutes Intravenous Every 8 hours 07/02/16 1910 07/07/16 1933   07/02/16 2000  piperacillin-tazobactam (ZOSYN) IVPB 3.375 g  Status:  Discontinued     3.375 g 12.5 mL/hr over 240 Minutes Intravenous Every 8 hours 07/02/16 1910 07/02/16 1910   07/02/16 1600  piperacillin-tazobactam (ZOSYN) IVPB 3.375 g     3.375 g 100 mL/hr over 30 Minutes Intravenous  Once 07/02/16 1602 07/02/16 1545   07/02/16 1515  clindamycin (CLEOCIN) 900 mg,  gentamicin (GARAMYCIN) 240 mg in sodium chloride 0.9 % 1,000 mL for intraperitoneal lavage      Intraperitoneal To Surgery 07/02/16 1505 07/02/16 1653   07/02/16 1000  clindamycin (CLEOCIN) 900 mg, gentamicin (GARAMYCIN) 240 mg in sodium chloride 0.9 % 1,000 mL for intraperitoneal lavage  Status:  Discontinued      Intraperitoneal To Surgery 06/30/16 1345 06/30/16 1347   07/01/16 2200  cefoTEtan (CEFOTAN) 2 g in dextrose 5 % 50 mL IVPB     2 g 100 mL/hr over 30 Minutes Intravenous Every 12 hours 07/01/16 1804 07/01/16 2210   07/01/16 1024  clindamycin (CLEOCIN) 900 mg, gentamicin (GARAMYCIN) 240 mg in sodium chloride 0.9 % 1,000 mL for intraperitoneal lavage  Status:  Discontinued       As needed 07/01/16 1025 07/01/16 1605   07/01/16 0700  clindamycin (CLEOCIN) 900 mg, gentamicin (GARAMYCIN) 240 mg in sodium chloride 0.9 % 1,000 mL for intraperitoneal lavage  Status:  Discontinued      Intraperitoneal To Surgery 06/30/16 1334 06/30/16 1344   07/01/16 0700  clindamycin (CLEOCIN) 900 mg, gentamicin (GARAMYCIN) 240 mg in sodium chloride 0.9 % 1,000 mL for intraperitoneal lavage  Status:  Discontinued      Intraperitoneal To Surgery 06/30/16 1347 07/01/16 1741   07/01/16 0649  cefoTEtan (CEFOTAN) 2 g in dextrose 5 % 50 mL IVPB     2 g 100 mL/hr over 30 Minutes Intravenous On call to O.R. 07/01/16 [id number] 07/01/16 [id number]  07/01/16 0649  neomycin (MYCIFRADIN) tablet 1,000 mg  Status:  Discontinued     1,000 mg Oral 3 times per day 07/01/16 0650 07/01/16 0713   07/01/16 0649  metroNIDAZOLE (FLAGYL) tablet 1,000 mg  Status:  Discontinued     1,000 mg Oral 3 times per day 07/01/16 0650 07/01/16 0713      Assessment/Plan: s/p Procedure(s): LAPAROSCOPY DIAGNOSTIC, LAPAROSCOPIC LYSIS OF ADHESIONS, SEROSAL REPAIR SMALL BOWEL RESECTION, OMENTOPEXY APPLICATION OF WOUND VAC LAPAROSCOPIC LYSIS OF ADHESIONS SMALL BOWEL RESECTION APPLICATION OF WOUND VAC NG replaced yesterday for ileus. More comfortable  today. No evidence of infection currently. Check labs and KUB tomorrow. No change in therapy today.   LOS: 10 days    [name] T 11/11/2017Patient ID: [name], female   DOB: 24-Dec-1955, 60 y.o.   MRN: [id number]

## 2016-07-12 ENCOUNTER — Inpatient Hospital Stay (HOSPITAL_COMMUNITY): Payer: Managed Care, Other (non HMO)

## 2016-07-12 LAB — CBC WITH DIFFERENTIAL/PLATELET
Basophils Absolute: 0 10*3/uL (ref 0.0–0.1)
Basophils Relative: 0 %
EOS ABS: 0.2 10*3/uL (ref 0.0–0.7)
EOS PCT: 2 %
HCT: 26.7 % — ABNORMAL LOW (ref 36.0–46.0)
HEMOGLOBIN: 8.6 g/dL — AB (ref 12.0–15.0)
LYMPHS ABS: 1.8 10*3/uL (ref 0.7–4.0)
Lymphocytes Relative: 15 %
MCH: 28.6 pg (ref 26.0–34.0)
MCHC: 32.2 g/dL (ref 30.0–36.0)
MCV: 88.7 fL (ref 78.0–100.0)
MONO ABS: 1.4 10*3/uL — AB (ref 0.1–1.0)
MONOS PCT: 11 %
Neutro Abs: 9.1 10*3/uL — ABNORMAL HIGH (ref 1.7–7.7)
Neutrophils Relative %: 72 %
PLATELETS: 432 10*3/uL — AB (ref 150–400)
RBC: 3.01 MIL/uL — ABNORMAL LOW (ref 3.87–5.11)
RDW: 15.2 % (ref 11.5–15.5)
WBC: 12.5 10*3/uL — ABNORMAL HIGH (ref 4.0–10.5)

## 2016-07-12 LAB — BASIC METABOLIC PANEL
Anion gap: 5 (ref 5–15)
BUN: 8 mg/dL (ref 6–20)
CHLORIDE: 108 mmol/L (ref 101–111)
CO2: 28 mmol/L (ref 22–32)
CREATININE: 0.71 mg/dL (ref 0.44–1.00)
Calcium: 8.6 mg/dL — ABNORMAL LOW (ref 8.9–10.3)
GFR calc Af Amer: >60 mL/min (ref >60)
GFR calc non Af Amer: >60 mL/min (ref >60)
Glucose, Bld: 131 mg/dL — ABNORMAL HIGH (ref 65–99)
Potassium: 4.4 mmol/L (ref 3.5–5.1)
SODIUM: 141 mmol/L (ref 135–145)

## 2016-07-12 NOTE — Progress Notes (Signed)
Patient ID: MEDDIE VENNEMAN, female   DOB: 29-Sep-1955, 60 y.o.   MRN: [id number] 10 Days Post-Op  Subjective: Back pain which is chronic and is her major complaint. This is why she is using the pain medication. No significant abdominal pain. Ileostomy has been functioning.  Objective: Vital signs in last 24 hours: Temp:  [97.5 F (36.4 C)-98.3 F (36.8 C)] 97.5 F (36.4 C) (11/12 0512) Pulse Rate:  [77-87] 77 (11/12 0512) Resp:  [15-18] 15 (11/12 0512) BP: (134-147)/(74-82) 134/74 (11/12 0512) SpO2:  [97 %] 97 % (11/12 0512) Weight:  [80.3 kg (177 lb)] 80.3 kg (177 lb) (11/12 0652) Last BM Date: 07/11/16  Intake/Output from previous day: 11/11 0701 - 11/12 0700 In: 2520 [P.O.:120; I.V.:2400] Out: 2732 [Urine:1475; Emesis/NG output:1150; Drains:7; Stool:100] Intake/Output this shift: No intake/output data recorded.  General appearance: alert, cooperative and no distress GI: normal findings: soft, non-tender and Nondistended Incision/Wound: No erythema or drainage  Lab Results:   KUB pending  Recent Labs  07/12/16 0419  WBC 12.5*  HGB 8.6*  HCT 26.7*  PLT 432*   BMET  Recent Labs  07/12/16 0419  NA 141  K 4.4  CL 108  CO2 28  GLUCOSE 131*  BUN 8  CREATININE 0.71  CALCIUM 8.6*     Studies/Results: No results found.  Anti-infectives: Anti-infectives    Start     Dose/Rate Route Frequency Ordered Stop   07/03/16 0000  piperacillin-tazobactam (ZOSYN) IVPB 3.375 g     3.375 g 12.5 mL/hr over 240 Minutes Intravenous Every 8 hours 07/02/16 1910 07/07/16 1933   07/02/16 2000  piperacillin-tazobactam (ZOSYN) IVPB 3.375 g  Status:  Discontinued     3.375 g 12.5 mL/hr over 240 Minutes Intravenous Every 8 hours 07/02/16 1910 07/02/16 1910   07/02/16 1600  piperacillin-tazobactam (ZOSYN) IVPB 3.375 g     3.375 g 100 mL/hr over 30 Minutes Intravenous  Once 07/02/16 1602 07/02/16 1545   07/02/16 1515  clindamycin (CLEOCIN) 900 mg, gentamicin (GARAMYCIN) 240 mg  in sodium chloride 0.9 % 1,000 mL for intraperitoneal lavage      Intraperitoneal To Surgery 07/02/16 1505 07/02/16 1653   07/02/16 1000  clindamycin (CLEOCIN) 900 mg, gentamicin (GARAMYCIN) 240 mg in sodium chloride 0.9 % 1,000 mL for intraperitoneal lavage  Status:  Discontinued      Intraperitoneal To Surgery 06/30/16 1345 06/30/16 1347   07/01/16 2200  cefoTEtan (CEFOTAN) 2 g in dextrose 5 % 50 mL IVPB     2 g 100 mL/hr over 30 Minutes Intravenous Every 12 hours 07/01/16 1804 07/01/16 2210   07/01/16 1024  clindamycin (CLEOCIN) 900 mg, gentamicin (GARAMYCIN) 240 mg in sodium chloride 0.9 % 1,000 mL for intraperitoneal lavage  Status:  Discontinued       As needed 07/01/16 1025 07/01/16 1605   07/01/16 0700  clindamycin (CLEOCIN) 900 mg, gentamicin (GARAMYCIN) 240 mg in sodium chloride 0.9 % 1,000 mL for intraperitoneal lavage  Status:  Discontinued      Intraperitoneal To Surgery 06/30/16 1334 06/30/16 1344   07/01/16 0700  clindamycin (CLEOCIN) 900 mg, gentamicin (GARAMYCIN) 240 mg in sodium chloride 0.9 % 1,000 mL for intraperitoneal lavage  Status:  Discontinued      Intraperitoneal To Surgery 06/30/16 1347 07/01/16 1741   07/01/16 0649  cefoTEtan (CEFOTAN) 2 g in dextrose 5 % 50 mL IVPB     2 g 100 mL/hr over 30 Minutes Intravenous On call to O.R. 07/01/16 [id number] 07/01/16 [id number]   07/01/16 [id number]  neomycin (MYCIFRADIN) tablet 1,000 mg  Status:  Discontinued     1,000 mg Oral 3 times per day 07/01/16 0650 07/01/16 0713   07/01/16 0649  metroNIDAZOLE (FLAGYL) tablet 1,000 mg  Status:  Discontinued     1,000 mg Oral 3 times per day 07/01/16 0650 07/01/16 0713      Assessment/Plan: s/p Procedure(s): LAPAROSCOPY DIAGNOSTIC, LAPAROSCOPIC LYSIS OF ADHESIONS, SEROSAL REPAIR SMALL BOWEL RESECTION, OMENTOPEXY APPLICATION OF WOUND VAC LAPAROSCOPIC LYSIS OF ADHESIONS SMALL BOWEL RESECTION APPLICATION OF WOUND VAC Postoperative ileus. Still has significant NG output but ileostomy is functioning and  abdomen is soft. X-rays pending. Try clamping NG today.   LOS: 11 days    Camren Lipsett T 07/12/2016

## 2016-07-12 NOTE — Progress Notes (Signed)
Dr. Excell Seltzer aware via phone that pt's NGT was clamped this am til 11 then began to c/o heartburn. NGT unclamped. Currently 225 ml of yellow fluid in cannister. Denies nausea or heartburn at this time. See new orders received.

## 2016-07-12 NOTE — Consult Note (Signed)
[facility] Nurse ostomy consult note Stoma type/location: RLQ loop ileostomy. Pouch applied on Friday is intact.  Plan for pouch change in am. Stomal assessment/size: 1 and 3/8 inches. Peristomal assessment: Not seen today Treatment options for stomal/peristomal skin: skin barrier ring Output brown/green liquid stool.  NGT in place and currently clamped for 4 hours. Ostomy pouching: 1pc. With skin barrier ring  Education provided: None.  Patient to be emptying pouching system with stand by assist. Enrolled patient in [facility] program: Yes [facility] nursing team will follow, and will remain available to this patient, the nursing, surgical and medical teams.   Thanks, Maudie Flakes, MSN, RN, [place], Arther Abbott  Pager# [phone]

## 2016-07-12 NOTE — Progress Notes (Signed)
Nutrition Follow-up  DOCUMENTATION CODES:   Severe malnutrition in context of acute illness/injury, Obesity unspecified  INTERVENTION:   Diet advancement per MD  As it is now day 11 of inadequate intake, patient is severely malnourished. Once medically appropriate, Recommend initiating TF through NG tube already present. Can advance tube post-pyloric via IR if there is concern for intolerance. Recommend initiating Vital AF 1.2 @ 20 ml/hr, then advancing by 15 ml/hr Q8hrs to goal of Vital AF 1.2 @ 65 ml/hr. Goal rate provides 1872 kcal, 117 grams protein, and 1264 ml H2O daily. Dr. Johney Maine aware of recommendations.  Upon diet advancement back to FLD recommend Ensure Enlive po BID to supplement intake.  Can provide education on ileostomy nutrition therapy prior to discharge  RD to continue to monitor  NUTRITION DIAGNOSIS:   Inadequate oral intake related to poor appetite, vomiting, nausea, other (see comment) (abdominal pain) as evidenced by meal completion < 50%, per patient/family report, percent weight loss.  Ongoing.  GOAL:   Patient will meet greater than or equal to 90% of their needs  Not meeting.  MONITOR:   PO intake, Supplement acceptance, Diet advancement, Weight trends, Labs, I & O's  ASSESSMENT:   60 year old female with medical history significant for ovarian mass and rectal cancer (diagnosed 05/14/2016). Patient now s/p rectosigmoid resection, coloanal anastomosis, bilateral salpingo-oophorectomy, lysis of adhesions, and diverting loop ileostomy on 11/1.   Patient in room with husband at bedside. NGT is back to suction after clamping trial. Per husband, pt became nauseous within 30 minutes of clamping. Output already 100 ml during time of visit. Pt states she still feels nauseous and has pain at this time.  Given severe malnutrition, would consider initiation of nutrition support. Patient's weight is down 10 lb since 11/9.  Labs reviewed. Medications:D5 and .45%  NaCl w/ KCl infusion at 100 ml/hr- provides 408 kcal, Zofran IV PRN  Diet Order:  Diet - low sodium heart healthy Diet NPO time specified Except for: Ice Chips  Skin:  Wound (see comment) (Closed incision abdomen (wound vac) and rectum)  Last BM:  11/11  Height:   Ht Readings from Last 1 Encounters:  07/01/16 5\' 3"  (1.6 m)    Weight:   Wt Readings from Last 1 Encounters:  07/12/16 177 lb (80.3 kg)    Ideal Body Weight:  52.27 kg  BMI:  Body mass index is 31.35 kg/m.  Estimated Nutritional Needs:   Kcal:  1820-2100 kcal (MSJ x 1.3-1.5)  Protein:  110-130 grams (1.3-1.5 grams/kg)  Fluid:  1.8-2.1 L/day  EDUCATION NEEDS:   Education needs no appropriate at this time  Clayton Bibles, MS, RD, LDN Pager: [phone] After Hours Pager: [phone]

## 2016-07-13 DIAGNOSIS — Z1379 Encounter for other screening for genetic and chromosomal anomalies: Secondary | ICD-10-CM | POA: Insufficient documentation

## 2016-07-13 NOTE — Progress Notes (Signed)
[facility]  [address] [place], [place] [address] Phone: [phone] FAX: [phone]   [name] [id number] September 04, 1955  CARE TEAM:  PCP: Linda Cote, NP  Outpatient Care Team: Patient Care Team: Linda Cote, NP as PCP - General (Obstetrics and Gynecology) Linda Boston, MD as Consulting Physician (General Surgery) Linda Ada, MD as Consulting Physician (Gastroenterology) Linda Mass, MD as Consulting Physician (Gynecology) Linda Ade, RN as Registered Nurse  Inpatient Treatment Team: Treatment Team: Attending Provider: [name], MD; Technician: [name], NT; Registered Nurse: Linda Mech, RN; Technician: [name], NT; Registered Nurse: Linda Baars, RN; Technician: [name], NT  Problem List:   Principal Problem:   Rectal cancer s/p redo robotic LAR with coloanal anastomosis 07/01/2016 Active Problems:   Ovarian cyst s/p BSO 07/01/2016   Family history of ovarian cancer s/p BSO 07/01/2016   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   Uterine fibromyoma   Obesity   Hypokalemia   Hypomagnesemia   Protein-calorie malnutrition, severe  SURGERY 07/01/2016  POST-OPERATIVE DIAGNOSIS:    RECTAL CANCER LEFT OVARIAN Mcpherson STRONG FAMILY HISTORY OF OVARIAN CANCER  PROCEDURE:  : XI ROBOTIC ASSISTED REDO LOWER ANTERIOR  RECTO-SIGMOID RESECTION  COLOANAL Handsewn ANASTOMOSIS  SPLENIC FLEXURE MOBILIZATION BILATERAL SALPINGO-OOPHORECTOMY LAPAROSCOPIC & ROBOTIC LYSIS OF ADHESIONS X 2 HOURS (33% of case) DIVERTING LOOP ILEOSTOMY Cystoscopy with placement of bilateral lighted ureteral stents    SURGEON:  Linda Boston, MD  11 Days Post-Op  07/02/2016  POST-OPERATIVE DIAGNOSIS:  DELAYED SMALL BOWEL PERFORATION  PROCEDURE:   LAPAROSCOPY DIAGNOSTIC SMALL BOWEL RESECTION SEROSAL REPAIR SMALL BOWEL  OMENTOPEXY  Surgeon(s): Linda Boston, MD  Assessment  Ileus hopefully  resolving   Plan:  -retry liquids w NGT clamped since lower output, AXR w/o SBO/ileus, nausea gone, etc.   If tolerates AND ILEOSTOMY OUTPUT, then d/c NGT later today. -CT scan if not opened up or worse. -IVF until ileus resolves.  TNA if CT scan concerning or pt worse.  -pathology with no LN involvement nor residual tumor.   -Ostomy care.   -d/c final drain -wound vac to help wound upper midline incision close down -Pack LUQ port w wick -VTE prophylaxis- SCDs, etc -mobilize as tolerated to help recovery  I updated the patient's status to the patient & sister.  Recommendations were made.  Questions were answered.  She expressed understanding & appreciation.   [name], M.D., F.A.C.S. Gastrointestinal and Minimally Invasive Surgery [facility] [address], [place], [place] [address] [phone] Main / Paging   07/13/2016  Subjective:  Walking much more C/o back pain Having flatus in ileostomy bag Mild HB.  No nausea w NGT clamped RN in room Sister in room   Objective:  Vital signs:  Vitals:   07/12/16 0652 07/12/16 1400 07/12/16 2300 07/13/16 0421  BP:  128/79 119/66 135/69  Pulse:  68 80 75  Resp:    16  Temp:  97.7 F (36.5 C) 98.4 F (36.9 C) 97.9 F (36.6 C)  TempSrc:  Oral Oral Oral  SpO2:  97% 100% 99%  Weight: 80.3 kg (177 lb)     Height:        Last BM Date: 07/11/16  Intake/Output   Yesterday:  11/12 0701 - 11/13 0700 In: 2700 [P.O.:180; I.V.:2400; NG/GT:120] Out: 2365 [Urine:1400; Emesis/NG output:900; Drains:15; Stool:50] This shift:  No intake/output data recorded.  Bowel function:  Flatus: YES  BM:  YES - dark syrupy bilious  LUQ drain (to RUQ) - serous   Physical Exam:  General: Pt awake/alert/oriented x4 in No acute distress. Alert, smiling Eyes: PERRL, normal EOM.  Sclera clear.  No icterus Neuro: CN II-XII intact w/o focal sensory/motor deficits. Lymph: No head/neck/groin  lymphadenopathy Psych:  No delerium/psychosis/paranoia HENT: Normocephalic, Mucus membranes moist.  No thrush Neck: Supple, No tracheal deviation Chest: No chest wall pain w good excursion CV:  Pulses intact.  Regular rhythm MS: Normal AROM mjr joints.  No obvious deformity Abdomen: Soft. Obese Nondistended.  Tenderness at ileostomy mild.  R sided ileostomy flat pink w scant gas in bag.   RUQ port with drainage - I repacked.  No evidence of peritonitis.  No incarcerated hernias. Ext:  SCDs BLE.  No mjr edema.  No cyanosis Skin: No petechiae / purpura  Results:   Diagnosis 1. Ovary and fallopian tube, right - BENIGN FALLOPIAN TUBE. - OVARIAN TISSUE NOT IDENTIFIED. 2. Colon, segmental resection for tumor, recto-sigmoid - BENIGN COLONIC MUCOSA WITH DIVERTICULOSIS. - FOCAL MESORECTAL INFLAMMATION AND REACTIVE CHANGES. - NINE OF NINE LYMPH NODES NEGATIVE FOR MALIGNANCY (0/9). - NO DYSPLASIA OR MALIGNANCY. - SEE COMMENT. 3. Adnexa - ovary +/- tube, non-neoplastic, left - BENIGN SEROUS CYSTADENOMA. - BENIGN FALLOPIAN TUBE WITH PARATUBAL CYSTS. - FIBROUS ADHESIONS. Microscopic Comment 2. The fat was cleared and the total lymph node count is nine for this specimen. Linda Males MD Pathologist, Electronic Signature (Case signed 07/06/2016) Specimen [name] and Clinical Information Specimen(s) Obtained: 1. Ovary and fallopian tube, right 2. Colon, segmental resection for tumor, recto-sigmoid 3. Adnexa - ovary +/- tube, non-neoplastic, left Specimen Clinical Information 1. rectal cancer [rd] 1 of 2 FINAL for [name] ([id number]) [name] 1. Received in formalin, clinically labeled "right fallopian tube and ovary", is a 4.8 cm in length and 0.3 cm in diameter segment of fallopian tube with possible fimbria at one end. There is pink to hyperemic serosa with few scattered adhesions and unremarkable cut surfaces. There is also a 2.0 x 1.8 x 0.4 cm aggregate of tan red to dark red soft  to rubbery tissue with unremarkable cut surfaces. No ovarian tissue is identified. Block Summary: A = sections of tube including presumed distal end B = remaining tissue fragments Total: Two blocks 2. Received in formalin is a 19 cm in length portion of intestine, clinically recto-sigmoid, suture at proximal margin. The distal margin is irregular. Mesorectum is sparse and incomplete. On opening, there is tan pink smooth, soft mucosa with normal intestinal folds. No Mcpherson lesions are identified. The wall of the rectum is up to 0.8 cm thick, and has tan-white dense fibrotic tissue. The rectum and distal sigmoid also have several unperforated diverticula. Found within the fat are six possible lymph nodes ranging from 0.1 to 0.4 cm. Following fixation of fat in clearing solution, three additional possible nodes are submitted. The external surface of the rectum is inked black, and the distal margin is inked orange. Block Summary: A = shave of proximal margin B-F = radial sections of distal margin Mcpherson-Q = thickened wall of rectum R = three possible nodes S = three possible nodes T = three possible nodes found after fat clearing Total: 20 blocks submitted 3. Received in formalin is a 6.2 x 4.2 x 3.5 cm ovary, clinically left, which has a fimbriated segment of fallopian tube adherent to one aspect. There are also scattered fibromembranous and fatty adhesions. On sectioning, the ovary contains a 4.0 cm  smooth serous cyst. The remaining cut surfaces of ovary are unremarkable. The segment of tube is 7.4 cm in length, up to 0.4 cm in diameter, has a pink to hyperemic serosa with few scattered adhesions, and unremarkable cut surfaces. Block Summary: A-C = sections of ovary including cyst D = sections of tube E = distal end of tube Total: Five blocks submitted (SW:ds 07/02/16) Report signed out from the following location(s) Technical component and interpretation was performed at [facility] [place], [place], [place] [address]. CLIA #: [id number], 2 of 2  Labs: Results for orders placed or performed during the hospital encounter of 07/01/16 (from the past 48 hour(s))  CBC with Differential/Platelet     Status: Abnormal   Collection Time: 07/12/16  4:19 AM  Result Value Ref Range   WBC 12.5 (H) 4.0 - 10.5 K/uL   RBC 3.01 (L) 3.87 - 5.11 MIL/uL   Hemoglobin 8.6 (L) 12.0 - 15.0 Mcpherson/dL   HCT 26.7 (L) 36.0 - 46.0 %   MCV 88.7 78.0 - 100.0 fL   MCH 28.6 26.0 - 34.0 pg   MCHC 32.2 30.0 - 36.0 Mcpherson/dL   RDW 15.2 11.5 - 15.5 %   Platelets 432 (H) 150 - 400 K/uL   Neutrophils Relative % 72 %   Neutro Abs 9.1 (H) 1.7 - 7.7 K/uL   Lymphocytes Relative 15 %   Lymphs Abs 1.8 0.7 - 4.0 K/uL   Monocytes Relative 11 %   Monocytes Absolute 1.4 (H) 0.1 - 1.0 K/uL   Eosinophils Relative 2 %   Eosinophils Absolute 0.2 0.0 - 0.7 K/uL   Basophils Relative 0 %   Basophils Absolute 0.0 0.0 - 0.1 K/uL  Basic metabolic panel     Status: Abnormal   Collection Time: 07/12/16  4:19 AM  Result Value Ref Range   Sodium 141 135 - 145 mmol/L   Potassium 4.4 3.5 - 5.1 mmol/L   Chloride 108 101 - 111 mmol/L   CO2 28 22 - 32 mmol/L   Glucose, Bld 131 (H) 65 - 99 mg/dL   BUN 8 6 - 20 mg/dL   Creatinine, Ser 0.71 0.44 - 1.00 mg/dL   Calcium 8.6 (L) 8.9 - 10.3 mg/dL   GFR calc non Af Amer >60 >60 mL/min   GFR calc Af Amer >60 >60 mL/min    Comment: (NOTE) The eGFR has been calculated using the CKD EPI equation. This calculation has not been validated in all clinical situations. eGFR's persistently <60 mL/min signify possible Chronic Kidney Disease.    Anion gap 5 5 - 15    Imaging / Studies: Dg Abd 2 Views  Result Date: 07/12/2016 CLINICAL DATA:  Status post low anterior rectosigmoid resection for rectal cancer with loop ileostomy and small bowel resection. EXAM: ABDOMEN - 2 VIEW COMPARISON:  None. FINDINGS: Nasogastric tube extends into the distal stomach. Surgical drain crosses  the midline from the left an terminates in the right lateral abdomen. No evidence of significant ileus or bowel obstruction. No free air identified. Thoracolumbar scoliosis present. No abnormal calcifications. IMPRESSION: No evidence of bowel obstruction, ileus or free air. Electronically Signed   By: Aletta Edouard M.D.   On: 07/12/2016 09:17    Medications / Allergies: per chart  Antibiotics: Anti-infectives    Start     Dose/Rate Route Frequency Ordered Stop   07/03/16 0000  piperacillin-tazobactam (ZOSYN) IVPB 3.375 Mcpherson     3.375 Mcpherson 12.5 mL/hr over 240 Minutes  Intravenous Every 8 hours 07/02/16 1910 07/07/16 1933   07/02/16 2000  piperacillin-tazobactam (ZOSYN) IVPB 3.375 Mcpherson  Status:  Discontinued     3.375 Mcpherson 12.5 mL/hr over 240 Minutes Intravenous Every 8 hours 07/02/16 1910 07/02/16 1910   07/02/16 1600  piperacillin-tazobactam (ZOSYN) IVPB 3.375 Mcpherson     3.375 Mcpherson 100 mL/hr over 30 Minutes Intravenous  Once 07/02/16 1602 07/02/16 1545   07/02/16 1515  clindamycin (CLEOCIN) 900 mg, gentamicin (GARAMYCIN) 240 mg in sodium chloride 0.9 % 1,000 mL for intraperitoneal lavage      Intraperitoneal To Surgery 07/02/16 1505 07/02/16 1653   07/02/16 1000  clindamycin (CLEOCIN) 900 mg, gentamicin (GARAMYCIN) 240 mg in sodium chloride 0.9 % 1,000 mL for intraperitoneal lavage  Status:  Discontinued      Intraperitoneal To Surgery 06/30/16 1345 06/30/16 1347   07/01/16 2200  cefoTEtan (CEFOTAN) 2 Mcpherson in dextrose 5 % 50 mL IVPB     2 Mcpherson 100 mL/hr over 30 Minutes Intravenous Every 12 hours 07/01/16 1804 07/01/16 2210   07/01/16 1024  clindamycin (CLEOCIN) 900 mg, gentamicin (GARAMYCIN) 240 mg in sodium chloride 0.9 % 1,000 mL for intraperitoneal lavage  Status:  Discontinued       As needed 07/01/16 1025 07/01/16 1605   07/01/16 0700  clindamycin (CLEOCIN) 900 mg, gentamicin (GARAMYCIN) 240 mg in sodium chloride 0.9 % 1,000 mL for intraperitoneal lavage  Status:  Discontinued      Intraperitoneal To Surgery  06/30/16 1334 06/30/16 1344   07/01/16 0700  clindamycin (CLEOCIN) 900 mg, gentamicin (GARAMYCIN) 240 mg in sodium chloride 0.9 % 1,000 mL for intraperitoneal lavage  Status:  Discontinued      Intraperitoneal To Surgery 06/30/16 1347 07/01/16 1741   07/01/16 0649  cefoTEtan (CEFOTAN) 2 Mcpherson in dextrose 5 % 50 mL IVPB     2 Mcpherson 100 mL/hr over 30 Minutes Intravenous On call to O.R. 07/01/16 [id number] 07/01/16 0835   07/01/16 0649  neomycin (MYCIFRADIN) tablet 1,000 mg  Status:  Discontinued     1,000 mg Oral 3 times per day 07/01/16 0650 07/01/16 0713   07/01/16 0649  metroNIDAZOLE (FLAGYL) tablet 1,000 mg  Status:  Discontinued     1,000 mg Oral 3 times per day 07/01/16 0650 07/01/16 3810        Note: Portions of this report may have been transcribed using voice recognition software. Every effort was made to ensure accuracy; however, inadvertent computerized transcription errors may be present.   Any transcriptional errors that result from this process are unintentional.     [name], M.D., F.A.C.S. Gastrointestinal and Minimally Invasive Surgery [facility] [address] [place], [place] [address] [phone] Main / Paging   07/13/2016

## 2016-07-13 NOTE — Progress Notes (Signed)
GENETIC TEST RESULT  HPI: Ms. Tates was previously seen in the [facility] clinic due to a personal history of rectal cancer, family history of ovarian, colorectal, uterine, and breast cancers, and concerns regarding a hereditary predisposition to cancer. Please refer to our prior cancer genetics clinic note from June 09, 2016 for more information regarding Ms. Finlay's medical, social and family histories, and our assessment and recommendations, at the time. Ms. Winnick recent genetic test results were disclosed to her, as were recommendations warranted by these results. These results and recommendations are discussed in more detail below.  GENETIC TEST RESULTS: At the time of Ms. Terwilliger's visit on 06/09/16, we recommended she pursue genetic testing of the 42-gene [employer] Common Hereditary Cancers Panel (Breast, Gyn, GI) through [facility]. The 42-gene [employer] Common Hereditary Cancers Panel (Breast, Gyn, GI) performed by [facility], [place]) includes sequencing and/or deletion/duplication analysis for the following genes: APC, ATM, AXIN2, BARD1, BMPR1A, BRCA1, BRCA2, BRIP1, CDH1, CDKN2A, CHEK2, DICER1, EPCAM, GREM1, KIT, MEN1, MLH1, MSH2, MSH6, MUTYH, NBN, NF1, PALB2, PDGFRA, PMS2, POLD1, POLE, PTEN, RAD50, RAD51C, RAD51D, SDHA, SDHB, SDHC, SDHD, SMAD4, SMARCA4, STK11, TP53, TSC1, TSC2, and VHL.  Those results are now back, the report date for which is June 15, 2016.  Genetic testing was normal, and did not reveal a deleterious mutation in these genes.  Additionally, no variants of uncertain significance (VUSes) were found.  The test report will be scanned into EPIC and will be located under the Results Review tab in the Pathology>Molecular Pathology section.   We discussed with Ms. Kubin that since the current genetic testing is not perfect, it is possible there may be a gene mutation in one of these genes that current testing cannot detect, but that  chance is small. We also discussed, that it is possible that another gene that has not yet been discovered, or that we have not yet tested, is responsible for the cancer diagnoses in the family, and it is, therefore, important to remain in touch with cancer genetics in the future so that we can continue to offer Ms. Wint the most up-to-date genetic testing.   CANCER SCREENING RECOMMENDATIONS: While we still do not have an explanation for the personal and family history of cancer, this result may be reassuring and indicate that Ms. Macchia likely does not have an increased risk for a future cancer due to a mutation in one of these genes. This normal test also suggests that Ms. Welty's cancer was most likely not due to an inherited predisposition associated with one of these genes.  Most cancers happen by chance and this negative test suggests that her cancer falls into this category.  We, therefore, recommended she continue to follow the cancer management and screening guidelines provided by her oncology and primary healthcare providers.   RECOMMENDATIONS FOR FAMILY MEMBERS: Men and women in this family might be at some increased risk of developing cancer, over the general population risk, simply due to the family history of cancer. We recommended women in this family have a yearly mammogram beginning at age 72, or 31 years younger than the earliest onset of cancer, an annual clinical breast exam, and perform monthly breast self-exams. Women in this family should also have a gynecological exam as recommended by their primary provider. All family members should have a colonoscopy by age 63.  Ms. Foskey siblings should be having colonscopies.  Her children can get their first colonoscopy at the age of 42.  Based  on Ms. Messamore family history of ovarian cancer, we recommended her brother and sister have genetic counseling and testing, as there could be a mutation in the family that Ms. Mayhall herself just did  not inherit. Ms. Krukowski will let us know if we can be of any assistance in coordinating genetic counseling and/or testing for this family member.   FOLLOW-UP: Lastly, we discussed with Ms. Peach that cancer genetics is a rapidly advancing field and it is possible that new genetic tests will be appropriate for her and/or her family members in the future. We encouraged her to remain in contact with cancer genetics on an annual basis so we can update her personal and family histories and let her know of advances in cancer genetics that may benefit this family.   Our contact number was provided. Ms. Marzano questions were answered to her satisfaction, and she knows she is welcome to call us at anytime with additional questions or concerns.   Jeanine Luz, MS, [facility] Certified Genetic Counselor [email or web address] Phone: [phone]

## 2016-07-13 NOTE — Consult Note (Signed)
[facility] Nurse wound follow up Wound type: Midline surgical incision with stab wound at 7 o'clock measuring 0.4cm x 1cm x 1.2cm Measurement:9.5cm x 2.5cm x 2.4cm Wound bed:red with small islands of subcutaneous fat evident, somewhat dry Drainage (amount, consistency, odor) scant serous Periwound:intact, clear Dressing procedure/placement/frequency: Dressings removed and wound cleansed with NS. Stab wound re-filled with gauze packing strip. One piece of black foam fit to wound, covered with drape. Attached to suction and an immediate seal is achieved at 141mmHg continuous negative pressure. Patient tolerated procedure well.   [facility] Nurse ostomy follow up Stoma type/location: RLQ loop ileostomy Stomal assessment/size: 1 and 3/8 inches round slightly budded Peristomal assessment: intact, clear Treatment options for stomal/peristomal skin: skin barrier ring Output: brown liquid effluent Ostomy pouching: 1pc. With skin barrier ring  Education provided: Patient is able to empty pouch independently today.  Is able to verbalize the steps for ostomy pouch application:  Removal, cleansing, preparation, placement of ring, removal of plastic backing from pouch, placement of pouch, placing hand on top of pouch for several minutes, removal of tape borders. Enrolled patient in [place] Start Discharge program: Yes  [facility] nursing team will follow, and will remain available to this patient, the nursing, surgical and medical teams.   Thanks, Maudie Flakes, MSN, RN, [place], Arther Abbott  Pager# [phone]

## 2016-07-13 NOTE — Progress Notes (Signed)
Nutrition Follow-up  DOCUMENTATION CODES:   Severe malnutrition in context of acute illness/injury, Obesity unspecified  INTERVENTION:   Diet advancement per MD  As it is now day 12 of inadequate intake, patient is severely malnourished. Once medically appropriate, Recommend initiating TF through NG tube already present. Can advance tube post-pyloric via IR if there is concern for intolerance. Recommend initiating Vital AF 1.2 @ 20 ml/hr, then advancing by 15 ml/hr Q8hrs to goal of Vital AF 1.2 @ 65 ml/hr. Goal rate provides 1872 kcal, 117 grams protein, and 1264 ml H2O daily. Dr. Johney Maine aware of recommendations.  Upon diet advancement back to FLD recommend Ensure Enlive po BID to supplement intake.  Can provide education on ileostomy nutrition therapy prior to discharge  RD to continue to monitor  NUTRITION DIAGNOSIS:   Inadequate oral intake related to poor appetite, vomiting, nausea, other (see comment) (abdominal pain) as evidenced by meal completion < 50%, per patient/family report, percent weight loss.  Ongoing.  GOAL:   Patient will meet greater than or equal to 90% of their needs  Not meeting.  MONITOR:   PO intake, Supplement acceptance, Diet advancement, Weight trends, Labs, I & O's  ASSESSMENT:   60 year old female with medical history significant for ovarian mass and rectal cancer (diagnosed 05/14/2016). Patient now s/p rectosigmoid resection, coloanal anastomosis, bilateral salpingo-oophorectomy, lysis of adhesions, and diverting loop ileostomy on 11/1.   RD has attempted to talk to patient x 2, both attempts pt was working with Therapist, sports and staff.  Noted plan per Dr. Johney Maine' note: NGT clamped, If tolerates and has ileostomy output, then NGT may be d/c. Depending on CT results, TPN in consideration. Please note recommendations above for TF.  Will continue to monitor plan.  Labs reviewed. Medications: D5 and .45% NaCl w/ KCl infusion at 75 ml/hr- provides 306 kcal,  Zofran IV PRN,  Diet Order:  Diet - low sodium heart healthy Diet clear liquid Room service appropriate? Yes; Fluid consistency: Thin  Skin:  Wound (see comment) (Closed incision abdomen (wound vac) and rectum)  Last BM:  11/11  Height:   Ht Readings from Last 1 Encounters:  07/01/16 5\' 3"  (1.6 m)    Weight:   Wt Readings from Last 1 Encounters:  07/12/16 177 lb (80.3 kg)    Ideal Body Weight:  52.27 kg  BMI:  Body mass index is 31.35 kg/m.  Estimated Nutritional Needs:   Kcal:  1820-2100 kcal (MSJ x 1.3-1.5)  Protein:  110-130 grams (1.3-1.5 grams/kg)  Fluid:  1.8-2.1 L/day  EDUCATION NEEDS:   Education needs no appropriate at this time  Clayton Bibles, MS, RD, LDN Pager: [phone] After Hours Pager: [phone]

## 2016-07-13 NOTE — Progress Notes (Signed)
Notified [facility] of delay in start of care, patient with ileus and not ready for d/c.

## 2016-07-14 LAB — CBC
HCT: 28.3 % — ABNORMAL LOW (ref 36.0–46.0)
HEMOGLOBIN: 8.8 g/dL — AB (ref 12.0–15.0)
MCH: 27.6 pg (ref 26.0–34.0)
MCHC: 31.1 g/dL (ref 30.0–36.0)
MCV: 88.7 fL (ref 78.0–100.0)
PLATELETS: 427 10*3/uL — AB (ref 150–400)
RBC: 3.19 MIL/uL — AB (ref 3.87–5.11)
RDW: 14.8 % (ref 11.5–15.5)
WBC: 9.9 10*3/uL (ref 4.0–10.5)

## 2016-07-14 LAB — BASIC METABOLIC PANEL
ANION GAP: 8 (ref 5–15)
BUN: 7 mg/dL (ref 6–20)
CHLORIDE: 107 mmol/L (ref 101–111)
CO2: 24 mmol/L (ref 22–32)
Calcium: 8.8 mg/dL — ABNORMAL LOW (ref 8.9–10.3)
Creatinine, Ser: 0.72 mg/dL (ref 0.44–1.00)
GFR calc Af Amer: >60 mL/min (ref >60)
GLUCOSE: 115 mg/dL — AB (ref 65–99)
POTASSIUM: 4.2 mmol/L (ref 3.5–5.1)
SODIUM: 139 mmol/L (ref 135–145)

## 2016-07-14 MED ORDER — RESOURCE INSTANT PROTEIN PO PWD PACKET
1.0000 | Freq: Three times a day (TID) | ORAL | Status: DC
  Administered 2016-07-14 – 2016-07-15 (×4): 6 g via ORAL
  Filled 2016-07-14 (×6): qty 6

## 2016-07-14 MED ORDER — SODIUM CHLORIDE 0.9 % IV SOLN: 250.0000 mL | INTRAVENOUS | Status: DC | PRN

## 2016-07-14 MED ORDER — FENTANYL CITRATE (PF) 100 MCG/2ML IJ SOLN: 25.0000 ug | INTRAMUSCULAR | Status: DC | PRN

## 2016-07-14 MED ORDER — SODIUM CHLORIDE 0.9% FLUSH: 3.0000 mL | INTRAVENOUS | Status: DC | PRN

## 2016-07-14 MED ORDER — OXYCODONE HCL 5 MG PO TABS
5.0000 mg | ORAL_TABLET | ORAL | Status: DC | PRN
  Administered 2016-07-14: 5 mg via ORAL
  Administered 2016-07-15: 10 mg via ORAL
  Filled 2016-07-14: qty 2
  Filled 2016-07-14: qty 1

## 2016-07-14 MED ORDER — LOPERAMIDE HCL 2 MG PO CAPS: 2.0000 mg | ORAL_CAPSULE | Freq: Three times a day (TID) | ORAL | Status: DC | PRN

## 2016-07-14 MED ORDER — SODIUM CHLORIDE 0.9% FLUSH
3.0000 mL | Freq: Two times a day (BID) | INTRAVENOUS | Status: DC
  Administered 2016-07-15: 3 mL via INTRAVENOUS

## 2016-07-14 MED ORDER — LACTATED RINGERS IV BOLUS (SEPSIS)
1000.0000 mL | Freq: Three times a day (TID) | INTRAVENOUS | Status: DC | PRN
  Administered 2016-07-15: 1000 mL via INTRAVENOUS
  Filled 2016-07-14: qty 1000

## 2016-07-14 NOTE — Progress Notes (Signed)
[facility]  [address] [place], [place] [address] Phone: [phone] FAX: [phone]   JULLIAN PREVITI [id number] 1956/05/16  CARE TEAM:  PCP: [name], NP  Outpatient Care Team: Patient Care Team: [name], NP as PCP - General (Obstetrics and Gynecology) [name], MD as Consulting Physician (General Surgery) [name], MD as Consulting Physician (Gastroenterology) [name], MD as Consulting Physician (Gynecology) Tania Ade, RN as Registered Nurse  Inpatient Treatment Team: Treatment Team: Attending Provider: [name], MD; Technician: Sueanne Margarita, NT; Registered Nurse: Bailey Mech, RN; Technician: Tenna Child, NT; Registered Nurse: Mertha Baars, RN; Technician: Raylene Everts, NT; Technician: Abbe Amsterdam, NT  Problem List:   Principal Problem:   Rectal cancer s/p redo robotic LAR with coloanal anastomosis 07/01/2016 Active Problems:   Ovarian cyst s/p BSO 07/01/2016   Family history of ovarian cancer s/p BSO 07/01/2016   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   Uterine fibromyoma   Obesity   Hypokalemia   Hypomagnesemia   Protein-calorie malnutrition, severe  SURGERY 07/01/2016  POST-OPERATIVE DIAGNOSIS:    RECTAL CANCER LEFT OVARIAN MASS STRONG FAMILY HISTORY OF OVARIAN CANCER  PROCEDURE:  : XI ROBOTIC ASSISTED REDO LOWER ANTERIOR  RECTO-SIGMOID RESECTION  COLOANAL Handsewn ANASTOMOSIS  SPLENIC FLEXURE MOBILIZATION BILATERAL SALPINGO-OOPHORECTOMY LAPAROSCOPIC & ROBOTIC LYSIS OF ADHESIONS X 2 HOURS (33% of case) DIVERTING LOOP ILEOSTOMY Cystoscopy with placement of bilateral lighted ureteral stents    SURGEON:  [name], MD  12 Days Post-Op  07/02/2016  POST-OPERATIVE DIAGNOSIS:  DELAYED SMALL BOWEL PERFORATION  PROCEDURE:   LAPAROSCOPY DIAGNOSTIC SMALL BOWEL RESECTION SEROSAL REPAIR SMALL BOWEL  OMENTOPEXY  Surgeon(s): [name],  MD  Assessment  Ileus resolving   Plan:  -adv to fulls, then soft later today. -CT scan if not opened up or worse. -stop IVF w PRN backup.  TNA if CT scan concerning or pt worse.  -pathology with no LN involvement nor residual tumor.   -Ostomy care.   -wound vac to help wound upper midline incision close down -Pack LUQ port w wick -VTE prophylaxis- SCDs, etc -mobilize as tolerated to help recovery  I updated the patient's status to the patient & sister.  Recommendations were made.  Questions were answered.  She expressed understanding & appreciation.   [name], M.D., F.A.C.S. Gastrointestinal and Minimally Invasive Surgery [facility] [address] [place], [place] [address] [phone] Main / Paging   07/14/2016  Subjective:  Tol liquids w NGT out Walking well Mild "burning" in mid- abdomen.  "I think I'm just hungry" Sister in room   Objective:  Vital signs:  Vitals:   07/13/16 0421 07/13/16 1336 07/13/16 2212 07/14/16 0332  BP: 135/69 127/80 115/79   Pulse: 75 81 89   Resp: _0 Temp: 97.9 F (36.6 C) 98.4 F (36.9 C) 98.2 F (36.8 C)   TempSrc: Oral Oral Oral   SpO2: 99% 98% 99%   Weight:    79.6 kg (175 lb 8 oz)  Height:        Last BM Date: 07/11/16  Intake/Output   Yesterday:  11/13 0701 - 11/14 0700 In: 1167.9 [P.O.:220; I.V.:947.9] Out: 1300 [Urine:650; Emesis/NG output:300; Stool:350] This shift:  No intake/output data recorded.  Bowel function:  Flatus: YES  BM:  YES - mixed solid/liquid   Physical Exam:  General: Pt awake/alert/oriented x4 in No acute distress. Alert, smiling  Eyes: PERRL, normal EOM.  Sclera clear.  No icterus Neuro: CN II-XII intact w/o focal sensory/motor deficits. Lymph: No head/neck/groin lymphadenopathy Psych:  No delerium/psychosis/paranoia HENT: Normocephalic, Mucus membranes moist.  No thrush Neck: Supple, No tracheal deviation Chest: No chest  wall pain w good excursion CV:  Pulses intact.  Regular rhythm MS: Normal AROM mjr joints.  No obvious deformity Abdomen: Soft. Obese Nondistended.  Tenderness at ileostomy mild.  R sided ileostomy flat pink w # gas & succus in bag.   RUQ port site more clean.  No evidence of peritonitis.  No incarcerated hernias. Ext:  SCDs BLE.  No mjr edema.  No cyanosis Skin: No petechiae / purpura  Results:   Diagnosis 1. Ovary and fallopian tube, right - BENIGN FALLOPIAN TUBE. - OVARIAN TISSUE NOT IDENTIFIED. 2. Colon, segmental resection for tumor, recto-sigmoid - BENIGN COLONIC MUCOSA WITH DIVERTICULOSIS. - FOCAL MESORECTAL INFLAMMATION AND REACTIVE CHANGES. - NINE OF NINE LYMPH NODES NEGATIVE FOR MALIGNANCY (0/9). - NO DYSPLASIA OR MALIGNANCY. - SEE COMMENT. 3. Adnexa - ovary +/- tube, non-neoplastic, left - BENIGN SEROUS CYSTADENOMA. - BENIGN FALLOPIAN TUBE WITH PARATUBAL CYSTS. - FIBROUS ADHESIONS. Microscopic Comment 2. The fat was cleared and the total lymph node count is nine for this specimen. [name] MD Pathologist, Electronic Signature (Case signed 07/06/2016) Specimen  and Clinical Information Specimen(s) Obtained: 1. Ovary and fallopian tube, right 2. Colon, segmental resection for tumor, recto-sigmoid 3. Adnexa - ovary +/- tube, non-neoplastic, left Specimen Clinical Information 1. rectal cancer [rd] 1 of 2 FINAL for Kadel, Flannery G ([id number])  1. Received in formalin, clinically labeled "right fallopian tube and ovary", is a 4.8 cm in length and 0.3 cm in diameter segment of fallopian tube with possible fimbria at one end. There is pink to hyperemic serosa with few scattered adhesions and unremarkable cut surfaces. There is also a 2.0 x 1.8 x 0.4 cm aggregate of tan red to dark red soft to rubbery tissue with unremarkable cut surfaces. No ovarian tissue is identified. Block Summary: A = sections of tube including presumed distal end B = remaining  tissue fragments Total: Two blocks 2. Received in formalin is a 19 cm in length portion of intestine, clinically recto-sigmoid, suture at proximal margin. The distal margin is irregular. Mesorectum is sparse and incomplete. On opening, there is tan pink smooth, soft mucosa with normal intestinal folds. No mass lesions are identified. The wall of the rectum is up to 0.8 cm thick, and has tan-white dense fibrotic tissue. The rectum and distal sigmoid also have several unperforated diverticula. Found within the fat are six possible lymph nodes ranging from 0.1 to 0.4 cm. Following fixation of fat in clearing solution, three additional possible nodes are submitted. The external surface of the rectum is inked black, and the distal margin is inked orange. Block Summary: A = shave of proximal margin B-F = radial sections of distal margin G-Q = thickened wall of rectum R = three possible nodes S = three possible nodes T = three possible nodes found after fat clearing Total: 20 blocks submitted 3. Received in formalin is a 6.2 x 4.2 x 3.5 cm ovary, clinically left, which has a fimbriated segment of fallopian tube adherent to one aspect. There are also scattered fibromembranous and fatty adhesions. On sectioning, the ovary contains a 4.0 cm smooth serous cyst. The remaining cut surfaces of ovary are unremarkable. The segment of tube is 7.4 cm in length, up to 0.4 cm in diameter, has a pink  to hyperemic serosa with few scattered adhesions, and unremarkable cut surfaces. Block Summary: A-C = sections of ovary including cyst D = sections of tube E = distal end of tube Total: Five blocks submitted (SW:ds 07/02/16) Report signed out from the following location(s) Technical component and interpretation was performed at [facility] [place], [place], [place] [address]. CLIA #: [id number], 2 of 2  Labs: Results for orders placed or performed during the hospital encounter of  07/01/16 (from the past 48 hour(s))  CBC     Status: Abnormal   Collection Time: 07/14/16  4:31 AM  Result Value Ref Range   WBC 9.9 4.0 - 10.5 K/uL   RBC 3.19 (L) 3.87 - 5.11 MIL/uL   Hemoglobin 8.8 (L) 12.0 - 15.0 g/dL   HCT 28.3 (L) 36.0 - 46.0 %   MCV 88.7 78.0 - 100.0 fL   MCH 27.6 26.0 - 34.0 pg   MCHC 31.1 30.0 - 36.0 g/dL   RDW 14.8 11.5 - 15.5 %   Platelets 427 (H) 150 - 400 K/uL  Basic metabolic panel     Status: Abnormal   Collection Time: 07/14/16  4:31 AM  Result Value Ref Range   Sodium 139 135 - 145 mmol/L   Potassium 4.2 3.5 - 5.1 mmol/L   Chloride 107 101 - 111 mmol/L   CO2 24 22 - 32 mmol/L   Glucose, Bld 115 (H) 65 - 99 mg/dL   BUN 7 6 - 20 mg/dL   Creatinine, Ser 0.72 0.44 - 1.00 mg/dL   Calcium 8.8 (L) 8.9 - 10.3 mg/dL   GFR calc non Af Amer >60 >60 mL/min   GFR calc Af Amer >60 >60 mL/min    Comment: (NOTE) The eGFR has been calculated using the CKD EPI equation. This calculation has not been validated in all clinical situations. eGFR's persistently <60 mL/min signify possible Chronic Kidney Disease.    Anion gap 8 5 - 15    Imaging / Studies: Dg Abd 2 Views  Result Date: 07/12/2016 CLINICAL DATA:  Status post low anterior rectosigmoid resection for rectal cancer with loop ileostomy and small bowel resection. EXAM: ABDOMEN - 2 VIEW COMPARISON:  None. FINDINGS: Nasogastric tube extends into the distal stomach. Surgical drain crosses the midline from the left an terminates in the right lateral abdomen. No evidence of significant ileus or bowel obstruction. No free air identified. Thoracolumbar scoliosis present. No abnormal calcifications. IMPRESSION: No evidence of bowel obstruction, ileus or free air. Electronically Signed   By: Aletta Edouard M.D.   On: 07/12/2016 09:17    Medications / Allergies: per chart  Antibiotics: Anti-infectives    Start     Dose/Rate Route Frequency Ordered Stop   07/03/16 0000  piperacillin-tazobactam (ZOSYN) IVPB 3.375  g     3.375 g 12.5 mL/hr over 240 Minutes Intravenous Every 8 hours 07/02/16 1910 07/07/16 1933   07/02/16 2000  piperacillin-tazobactam (ZOSYN) IVPB 3.375 g  Status:  Discontinued     3.375 g 12.5 mL/hr over 240 Minutes Intravenous Every 8 hours 07/02/16 1910 07/02/16 1910   07/02/16 1600  piperacillin-tazobactam (ZOSYN) IVPB 3.375 g     3.375 g 100 mL/hr over 30 Minutes Intravenous  Once 07/02/16 1602 07/02/16 1545   07/02/16 1515  clindamycin (CLEOCIN) 900 mg, gentamicin (GARAMYCIN) 240 mg in sodium chloride 0.9 % 1,000 mL for intraperitoneal lavage      Intraperitoneal To Surgery 07/02/16 1505 07/02/16 1653   07/02/16 1000  clindamycin (  CLEOCIN) 900 mg, gentamicin (GARAMYCIN) 240 mg in sodium chloride 0.9 % 1,000 mL for intraperitoneal lavage  Status:  Discontinued      Intraperitoneal To Surgery 06/30/16 1345 06/30/16 1347   07/01/16 2200  cefoTEtan (CEFOTAN) 2 g in dextrose 5 % 50 mL IVPB     2 g 100 mL/hr over 30 Minutes Intravenous Every 12 hours 07/01/16 1804 07/01/16 2210   07/01/16 1024  clindamycin (CLEOCIN) 900 mg, gentamicin (GARAMYCIN) 240 mg in sodium chloride 0.9 % 1,000 mL for intraperitoneal lavage  Status:  Discontinued       As needed 07/01/16 1025 07/01/16 1605   07/01/16 0700  clindamycin (CLEOCIN) 900 mg, gentamicin (GARAMYCIN) 240 mg in sodium chloride 0.9 % 1,000 mL for intraperitoneal lavage  Status:  Discontinued      Intraperitoneal To Surgery 06/30/16 1334 06/30/16 1344   07/01/16 0700  clindamycin (CLEOCIN) 900 mg, gentamicin (GARAMYCIN) 240 mg in sodium chloride 0.9 % 1,000 mL for intraperitoneal lavage  Status:  Discontinued      Intraperitoneal To Surgery 06/30/16 1347 07/01/16 1741   07/01/16 0649  cefoTEtan (CEFOTAN) 2 g in dextrose 5 % 50 mL IVPB     2 g 100 mL/hr over 30 Minutes Intravenous On call to O.R. 07/01/16 8115 07/01/16 0835   07/01/16 0649  neomycin (MYCIFRADIN) tablet 1,000 mg  Status:  Discontinued     1,000 mg Oral 3 times per day 07/01/16  0650 07/01/16 0713   07/01/16 0649  metroNIDAZOLE (FLAGYL) tablet 1,000 mg  Status:  Discontinued     1,000 mg Oral 3 times per day 07/01/16 0650 07/01/16 7262        Note: Portions of this report may have been transcribed using voice recognition software. Every effort was made to ensure accuracy; however, inadvertent computerized transcription errors may be present.   Any transcriptional errors that result from this process are unintentional.     [name], M.D., F.A.C.S. Gastrointestinal and Minimally Invasive Surgery [facility] [address] [place], [place] [address] [phone] Main / Paging   07/14/2016

## 2016-07-14 NOTE — Progress Notes (Signed)
Nutrition Follow-up  DOCUMENTATION CODES:   Severe malnutrition in context of acute illness/injury, Obesity unspecified  INTERVENTION:   Provide Beneprotein TID, each packet provides 25 kcal and 6g protein Encourage PO intakes   Will provide education on ileostomy nutrition therapy prior to discharge  RD to continue to monitor   NUTRITION DIAGNOSIS:   Inadequate oral intake related to poor appetite, vomiting, nausea, other (see comment) (abdominal pain) as evidenced by meal completion < 50%, per patient/family report, percent weight loss.  Ongoing but improving.  GOAL:   Patient will meet greater than or equal to 90% of their needs  Progressing.  MONITOR:   PO intake, Supplement acceptance, Diet advancement, Weight trends, Labs, I & O's  ASSESSMENT:   60 year old female with medical history significant for ovarian mass and rectal cancer (diagnosed 05/14/2016). Patient now s/p rectosigmoid resection, coloanal anastomosis, bilateral salpingo-oophorectomy, lysis of adhesions, and diverting loop ileostomy on 11/1.   Pt reports tolerating liquids. She had 1/2 cup of coffee and an [place] icee this morning. Pt trying to decide what to eat for lunch, provided guidance as pt reports not wanting a lot of sweet liquids. Pt decided on potato soup and maybe some ice cream. Pt is willing to try Beneprotein powder to add to her liquids to provide added protein. She denies any nausea at this time but states she has a sensitive gag reflex.  Pt's weight has decreased 11 lb since admission on 11/1.  Will continue to work with pt regarding PO intake and supplement options.   Labs reviewed. Medications: IV Zofran PRN  Diet Order:  Diet - low sodium heart healthy Diet full liquid Room service appropriate? Yes; Fluid consistency: Thin  Skin:  Wound (see comment) (Closed incision abdomen (wound vac) and rectum)  Last BM:  11/11  Height:   Ht Readings from Last 1 Encounters:  07/01/16 5'  3" (1.6 m)    Weight:   Wt Readings from Last 1 Encounters:  07/14/16 175 lb 8 oz (79.6 kg)    Ideal Body Weight:  52.27 kg  BMI:  Body mass index is 31.09 kg/m.  Estimated Nutritional Needs:   Kcal:  1820-2100 kcal (MSJ x 1.3-1.5)  Protein:  110-130 grams (1.3-1.5 grams/kg)  Fluid:  1.8-2.1 L/day  EDUCATION NEEDS:   Education needs no appropriate at this time  Clayton Bibles, MS, RD, LDN Pager: [phone] After Hours Pager: [phone]

## 2016-07-15 LAB — CBC
HCT: 29.3 % — ABNORMAL LOW (ref 36.0–46.0)
Hemoglobin: 9.2 g/dL — ABNORMAL LOW (ref 12.0–15.0)
MCH: 27.7 pg (ref 26.0–34.0)
MCHC: 31.4 g/dL (ref 30.0–36.0)
MCV: 88.3 fL (ref 78.0–100.0)
PLATELETS: 389 10*3/uL (ref 150–400)
RBC: 3.32 MIL/uL — ABNORMAL LOW (ref 3.87–5.11)
RDW: 14.7 % (ref 11.5–15.5)
WBC: 8 10*3/uL (ref 4.0–10.5)

## 2016-07-15 LAB — BASIC METABOLIC PANEL
Anion gap: 7 (ref 5–15)
BUN: 13 mg/dL (ref 6–20)
CALCIUM: 9.1 mg/dL (ref 8.9–10.3)
CO2: 27 mmol/L (ref 22–32)
CREATININE: 0.95 mg/dL (ref 0.44–1.00)
Chloride: 105 mmol/L (ref 101–111)
GFR calc Af Amer: >60 mL/min (ref >60)
Glucose, Bld: 102 mg/dL — ABNORMAL HIGH (ref 65–99)
POTASSIUM: 4.2 mmol/L (ref 3.5–5.1)
SODIUM: 139 mmol/L (ref 135–145)

## 2016-07-15 MED ORDER — SODIUM CHLORIDE 0.9% FLUSH
10.0000 mL | INTRAVENOUS | Status: DC | PRN
  Administered 2016-07-15: 10 mL
  Filled 2016-07-15: qty 40

## 2016-07-15 MED ORDER — HEPARIN SOD (PORK) LOCK FLUSH 100 UNIT/ML IV SOLN
250.0000 [IU] | INTRAVENOUS | Status: AC | PRN
  Administered 2016-07-15: 250 [IU]

## 2016-07-15 NOTE — Progress Notes (Signed)
Nutrition Follow-up  DOCUMENTATION CODES:   Severe malnutrition in context of acute illness/injury, Obesity unspecified  INTERVENTION:   Continue Beneprotein TID, each packet provides 25 kcal and 6g protein Provide Magic cup TID with meals, each supplement provides 290 kcal and 9 grams of protein Encourage PO intakes   Provided "Ileostomy Nutrition Therapy" handout from [employer].  RD to continue to monitor   NUTRITION DIAGNOSIS:   Inadequate oral intake related to poor appetite, vomiting, nausea, other (see comment) (abdominal pain) as evidenced by meal completion < 50%, per patient/family report, percent weight loss.  Improving.  GOAL:   Patient will meet greater than or equal to 90% of their needs  Progressing.  MONITOR:   PO intake, Supplement acceptance, Diet advancement, Weight trends, Labs, I & O's  ASSESSMENT:   60 year old female with medical history significant for ovarian mass and rectal cancer (diagnosed 05/14/2016). Patient now s/p rectosigmoid resection, coloanal anastomosis, bilateral salpingo-oophorectomy, lysis of adhesions, and diverting loop ileostomy on 11/1.   Patient in room with no family at bedside. Pt reports eating a pancake, a cup of potatoes, 2-3 bites of peaches, a carton of milk and coffee this morning for breakfast. She states she feels fine but cannot eat that much at one time. She states she received a Magic cup and liked it. Will order for pt today. Reviewed protein options for pt when she goes home.  Provided "Ileostomy Nutrition Therapy" handout. Reviewed recommendations with patient.  Patient's weight continues to decrease, now down 13 lb since admission 11/1.  Labs reviewed. Medications reviewed.  Diet Order:  Diet - low sodium heart healthy Diet Heart Room service appropriate? Yes; Fluid consistency: Thin  Skin:  Wound (see comment) (Closed incision abdomen (wound vac) and rectum)  Last BM:   11/14  Height:   Ht Readings from Last 1 Encounters:  07/01/16 5\' 3"  (1.6 m)    Weight:   Wt Readings from Last 1 Encounters:  07/15/16 173 lb 11.6 oz (78.8 kg)    Ideal Body Weight:  52.27 kg  BMI:  Body mass index is 30.77 kg/m.  Estimated Nutritional Needs:   Kcal:  1820-2100 kcal (MSJ x 1.3-1.5)  Protein:  110-130 grams (1.3-1.5 grams/kg)  Fluid:  1.8-2.1 L/day  EDUCATION NEEDS:   Education needs no appropriate at this time  Clayton Bibles, MS, RD, LDN Pager: [phone] After Hours Pager: [phone]
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
## 2016-07-15 NOTE — Progress Notes (Signed)
[facility]  [address] [place], [place] [address] Phone: [phone] FAX: [phone]   [name] [id number] December 23, 1955  CARE TEAM:  PCP: [name], NP  Outpatient Care Team: Patient Care Team: [name], NP as PCP - General (Obstetrics and Gynecology) [name], MD as Consulting Physician (General Surgery) [name], MD as Consulting Physician (Gastroenterology) [name], MD as Consulting Physician (Gynecology) Tania Ade, RN as Registered Nurse  Inpatient Treatment Team: Treatment Team: Attending Provider: [name], MD; Technician: Sueanne Margarita, NT; Registered Nurse: Bailey Mech, RN; Technician: Tenna Child, NT; Registered Nurse: Mertha Baars, RN; Technician: Raylene Everts, NT; Technician: Abbe Amsterdam, NT; Technician: Resa Miner, NT; Registered Nurse: Coralie Common, RN  Problem List:   Principal Problem:   Rectal cancer s/p redo robotic LAR with coloanal anastomosis 07/01/2016 Active Problems:   Ovarian cyst s/p BSO 07/01/2016   Family history of ovarian cancer s/p BSO 07/01/2016   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   Uterine fibromyoma   Obesity   Hypokalemia   Hypomagnesemia   Protein-calorie malnutrition, severe  SURGERY 07/01/2016  POST-OPERATIVE DIAGNOSIS:    RECTAL CANCER LEFT OVARIAN MASS STRONG FAMILY HISTORY OF OVARIAN CANCER  PROCEDURE:  : XI ROBOTIC ASSISTED REDO LOWER ANTERIOR  RECTO-SIGMOID RESECTION  COLOANAL Handsewn ANASTOMOSIS  SPLENIC FLEXURE MOBILIZATION BILATERAL SALPINGO-OOPHORECTOMY LAPAROSCOPIC & ROBOTIC LYSIS OF ADHESIONS X 2 HOURS (33% of case) DIVERTING LOOP ILEOSTOMY Cystoscopy with placement of bilateral lighted ureteral stents    SURGEON:  [name], MD  13 Days Post-Op  07/02/2016  POST-OPERATIVE DIAGNOSIS:  DELAYED SMALL BOWEL PERFORATION  PROCEDURE:   LAPAROSCOPY DIAGNOSTIC SMALL BOWEL  RESECTION SEROSAL REPAIR SMALL BOWEL  OMENTOPEXY  Surgeon(s): [name], MD  Assessment  Improved   Plan:  -PICC placement & set up qMWF 2L isotonic crystalloid infusuions to avoid dehydration/ARF -d/c with HH IVFs, ileostomy care, wound vac  -pathology with no LN involvement nor residual tumor.   -Ostomy care.   -wound vac to help wound upper midline incision close down -Pack LUQ port w wick -VTE prophylaxis- SCDs, etc -mobilize as tolerated to help recovery  I updated the patient's status to the patient & RN.  Recommendations were made.  Questions were answered.  They expressed understanding & appreciation.   [name], M.D., F.A.C.S. Gastrointestinal and Minimally Invasive Surgery [facility] [address] [place], [place] [address] [phone] Main / Paging   07/15/2016  Subjective:  Hungry Hard to drink a lot Denies much pain  Objective:  Vital signs:  Vitals:   07/14/16 0332 07/14/16 1409 07/14/16 2213 07/15/16 0526  BP:  119/70 110/69 103/74  Pulse:  (!) 101 68 73  Resp:  _0 Temp:  97.8 F (36.6 C) 98 F (36.7 C) 97.9 F (36.6 C)  TempSrc:  Oral Oral Oral  SpO2:  100% 99% 98%  Weight: 79.6 kg (175 lb 8 oz)   78.8 kg (173 lb 11.6 oz)  Height:        Last BM Date: 07/11/16  Intake/Output   Yesterday:  11/14 0701 - 11/15 0700 In: 612 [P.O.:600; I.V.:12] Out: 1700 [Urine:800; Stool:900] This shift:  No intake/output data recorded.  Bowel function:  Flatus: YES  BM:  YES - more oatmeal think   Physical Exam:  General: Pt awake/alert/oriented x4 in No acute distress. Alert, smiling Eyes: PERRL, normal EOM.  Sclera  clear.  No icterus Neuro: CN II-XII intact w/o focal sensory/motor deficits. Lymph: No head/neck/groin lymphadenopathy Psych:  No delerium/psychosis/paranoia HENT: Normocephalic, Mucus membranes moist.  No thrush Neck: Supple, No tracheal deviation Chest: No chest  wall pain w good excursion CV:  Pulses intact.  Regular rhythm MS: Normal AROM mjr joints.  No obvious deformity Abdomen: Soft. Obese Nondistended.  Tenderness at ileostomy mild.  R sided ileostomy flat pink w # gas & succus in bag.   RUQ port site more clean.  No evidence of peritonitis.  No incarcerated hernias. Ext:  SCDs BLE.  No mjr edema.  No cyanosis Skin: No petechiae / purpura  Results:   Diagnosis 1. Ovary and fallopian tube, right - BENIGN FALLOPIAN TUBE. - OVARIAN TISSUE NOT IDENTIFIED. 2. Colon, segmental resection for tumor, recto-sigmoid - BENIGN COLONIC MUCOSA WITH DIVERTICULOSIS. - FOCAL MESORECTAL INFLAMMATION AND REACTIVE CHANGES. - NINE OF NINE LYMPH NODES NEGATIVE FOR MALIGNANCY (0/9). - NO DYSPLASIA OR MALIGNANCY. - SEE COMMENT. 3. Adnexa - ovary +/- tube, non-neoplastic, left - BENIGN SEROUS CYSTADENOMA. - BENIGN FALLOPIAN TUBE WITH PARATUBAL CYSTS. - FIBROUS ADHESIONS. Microscopic Comment 2. The fat was cleared and the total lymph node count is nine for this specimen. [name] MD Pathologist, Electronic Signature (Case signed 07/06/2016) Specimen Gross and Clinical Information Specimen(s) Obtained: 1. Ovary and fallopian tube, right 2. Colon, segmental resection for tumor, recto-sigmoid 3. Adnexa - ovary +/- tube, non-neoplastic, left Specimen Clinical Information 1. rectal cancer [rd] 1 of 2 FINAL for Messmer, Tinamarie G ([id number]) Gross 1. Received in formalin, clinically labeled "right fallopian tube and ovary", is a 4.8 cm in length and 0.3 cm in diameter segment of fallopian tube with possible fimbria at one end. There is pink to hyperemic serosa with few scattered adhesions and unremarkable cut surfaces. There is also a 2.0 x 1.8 x 0.4 cm aggregate of tan red to dark red soft to rubbery tissue with unremarkable cut surfaces. No ovarian tissue is identified. Block Summary: A = sections of tube including presumed distal end B = remaining  tissue fragments Total: Two blocks 2. Received in formalin is a 19 cm in length portion of intestine, clinically recto-sigmoid, suture at proximal margin. The distal margin is irregular. Mesorectum is sparse and incomplete. On opening, there is tan pink smooth, soft mucosa with normal intestinal folds. No mass lesions are identified. The wall of the rectum is up to 0.8 cm thick, and has tan-white dense fibrotic tissue. The rectum and distal sigmoid also have several unperforated diverticula. Found within the fat are six possible lymph nodes ranging from 0.1 to 0.4 cm. Following fixation of fat in clearing solution, three additional possible nodes are submitted. The external surface of the rectum is inked black, and the distal margin is inked orange. Block Summary: A = shave of proximal margin B-F = radial sections of distal margin G-Q = thickened wall of rectum R = three possible nodes S = three possible nodes T = three possible nodes found after fat clearing Total: 20 blocks submitted 3. Received in formalin is a 6.2 x 4.2 x 3.5 cm ovary, clinically left, which has a fimbriated segment of fallopian tube adherent to one aspect. There are also scattered fibromembranous and fatty adhesions. On sectioning, the ovary contains a 4.0 cm smooth serous cyst. The remaining cut surfaces of ovary are unremarkable. The segment of tube is 7.4 cm in length, up to 0.4 cm in diameter, has a pink to hyperemic serosa with few scattered  adhesions, and unremarkable cut surfaces. Block Summary: A-C = sections of ovary including cyst D = sections of tube E = distal end of tube Total: Five blocks submitted (SW:ds 07/02/16) Report signed out from the following location(s) Technical component and interpretation was performed at [facility], [place], [place] [address]. CLIA #: [id number], 2 of 2  Labs: Results for orders placed or performed during the hospital encounter of  07/01/16 (from the past 48 hour(s))  CBC     Status: Abnormal   Collection Time: 07/14/16  4:31 AM  Result Value Ref Range   WBC 9.9 4.0 - 10.5 K/uL   RBC 3.19 (L) 3.87 - 5.11 MIL/uL   Hemoglobin 8.8 (L) 12.0 - 15.0 g/dL   HCT 28.3 (L) 36.0 - 46.0 %   MCV 88.7 78.0 - 100.0 fL   MCH 27.6 26.0 - 34.0 pg   MCHC 31.1 30.0 - 36.0 g/dL   RDW 14.8 11.5 - 15.5 %   Platelets 427 (H) 150 - 400 K/uL  Basic metabolic panel     Status: Abnormal   Collection Time: 07/14/16  4:31 AM  Result Value Ref Range   Sodium 139 135 - 145 mmol/L   Potassium 4.2 3.5 - 5.1 mmol/L   Chloride 107 101 - 111 mmol/L   CO2 24 22 - 32 mmol/L   Glucose, Bld 115 (H) 65 - 99 mg/dL   BUN 7 6 - 20 mg/dL   Creatinine, Ser 0.72 0.44 - 1.00 mg/dL   Calcium 8.8 (L) 8.9 - 10.3 mg/dL   GFR calc non Af Amer >60 >60 mL/min   GFR calc Af Amer >60 >60 mL/min    Comment: (NOTE) The eGFR has been calculated using the CKD EPI equation. This calculation has not been validated in all clinical situations. eGFR's persistently <60 mL/min signify possible Chronic Kidney Disease.    Anion gap 8 5 - 15  Basic metabolic panel     Status: Abnormal   Collection Time: 07/15/16  4:39 AM  Result Value Ref Range   Sodium 139 135 - 145 mmol/L   Potassium 4.2 3.5 - 5.1 mmol/L   Chloride 105 101 - 111 mmol/L   CO2 27 22 - 32 mmol/L   Glucose, Bld 102 (H) 65 - 99 mg/dL   BUN 13 6 - 20 mg/dL   Creatinine, Ser 0.95 0.44 - 1.00 mg/dL   Calcium 9.1 8.9 - 10.3 mg/dL   GFR calc non Af Amer >60 >60 mL/min   GFR calc Af Amer >60 >60 mL/min    Comment: (NOTE) The eGFR has been calculated using the CKD EPI equation. This calculation has not been validated in all clinical situations. eGFR's persistently <60 mL/min signify possible Chronic Kidney Disease.    Anion gap 7 5 - 15  CBC     Status: Abnormal   Collection Time: 07/15/16  4:39 AM  Result Value Ref Range   WBC 8.0 4.0 - 10.5 K/uL   RBC 3.32 (L) 3.87 - 5.11 MIL/uL   Hemoglobin 9.2  (L) 12.0 - 15.0 g/dL   HCT 29.3 (L) 36.0 - 46.0 %   MCV 88.3 78.0 - 100.0 fL   MCH 27.7 26.0 - 34.0 pg   MCHC 31.4 30.0 - 36.0 g/dL   RDW 14.7 11.5 - 15.5 %   Platelets 389 150 - 400 K/uL    Imaging / Studies: No results found.  Medications / Allergies: per chart  Antibiotics: Anti-infectives  Start     Dose/Rate Route Frequency Ordered Stop   07/03/16 0000  piperacillin-tazobactam (ZOSYN) IVPB 3.375 g     3.375 g 12.5 mL/hr over 240 Minutes Intravenous Every 8 hours 07/02/16 1910 07/07/16 1933   07/02/16 2000  piperacillin-tazobactam (ZOSYN) IVPB 3.375 g  Status:  Discontinued     3.375 g 12.5 mL/hr over 240 Minutes Intravenous Every 8 hours 07/02/16 1910 07/02/16 1910   07/02/16 1600  piperacillin-tazobactam (ZOSYN) IVPB 3.375 g     3.375 g 100 mL/hr over 30 Minutes Intravenous  Once 07/02/16 1602 07/02/16 1545   07/02/16 1515  clindamycin (CLEOCIN) 900 mg, gentamicin (GARAMYCIN) 240 mg in sodium chloride 0.9 % 1,000 mL for intraperitoneal lavage      Intraperitoneal To Surgery 07/02/16 1505 07/02/16 1653   07/02/16 1000  clindamycin (CLEOCIN) 900 mg, gentamicin (GARAMYCIN) 240 mg in sodium chloride 0.9 % 1,000 mL for intraperitoneal lavage  Status:  Discontinued      Intraperitoneal To Surgery 06/30/16 1345 06/30/16 1347   07/01/16 2200  cefoTEtan (CEFOTAN) 2 g in dextrose 5 % 50 mL IVPB     2 g 100 mL/hr over 30 Minutes Intravenous Every 12 hours 07/01/16 1804 07/01/16 2210   07/01/16 1024  clindamycin (CLEOCIN) 900 mg, gentamicin (GARAMYCIN) 240 mg in sodium chloride 0.9 % 1,000 mL for intraperitoneal lavage  Status:  Discontinued       As needed 07/01/16 1025 07/01/16 1605   07/01/16 0700  clindamycin (CLEOCIN) 900 mg, gentamicin (GARAMYCIN) 240 mg in sodium chloride 0.9 % 1,000 mL for intraperitoneal lavage  Status:  Discontinued      Intraperitoneal To Surgery 06/30/16 1334 06/30/16 1344   07/01/16 0700  clindamycin (CLEOCIN) 900 mg, gentamicin (GARAMYCIN) 240 mg in  sodium chloride 0.9 % 1,000 mL for intraperitoneal lavage  Status:  Discontinued      Intraperitoneal To Surgery 06/30/16 1347 07/01/16 1741   07/01/16 0649  cefoTEtan (CEFOTAN) 2 g in dextrose 5 % 50 mL IVPB     2 g 100 mL/hr over 30 Minutes Intravenous On call to O.R. 07/01/16 9622 07/01/16 0835   07/01/16 0649  neomycin (MYCIFRADIN) tablet 1,000 mg  Status:  Discontinued     1,000 mg Oral 3 times per day 07/01/16 0650 07/01/16 0713   07/01/16 0649  metroNIDAZOLE (FLAGYL) tablet 1,000 mg  Status:  Discontinued     1,000 mg Oral 3 times per day 07/01/16 0650 07/01/16 2979        Note: Portions of this report may have been transcribed using voice recognition software. Every effort was made to ensure accuracy; however, inadvertent computerized transcription errors may be present.   Any transcriptional errors that result from this process are unintentional.     [name], M.D., F.A.C.S. Gastrointestinal and Minimally Invasive Surgery [facility] [address] [place], [place] [address] [phone] Main / Paging   07/15/2016

## 2016-07-15 NOTE — Progress Notes (Signed)
Peripherally Inserted Central Catheter/Midline Placement  The IV Nurse has discussed with the patient and/or persons authorized to consent for the patient, the purpose of this procedure and the potential benefits and risks involved with this procedure.  The benefits include less needle sticks, lab draws from the catheter, and the patient may be discharged home with the catheter. Risks include, but not limited to, infection, bleeding, blood clot (thrombus formation), and puncture of an artery; nerve damage and irregular heartbeat and possibility to perform a PICC exchange if needed/ordered by physician.  Alternatives to this procedure were also discussed.  [facility] PICC patient education guide, fact sheet on infection prevention and patient information card has been provided to patient /or left at bedside.    PICC/Midline Placement Documentation        Addysen Louth, Nicolette Bang 07/15/2016, 2:04 PM

## 2016-07-15 NOTE — Progress Notes (Addendum)
Contacted [facility], faxed updated orders to them. Start of care will be 11/16.

## 2016-07-15 NOTE — Patient Care Conference (Signed)
Patient given bolus of LR 1021mL due to output being less than 181mL within 4 hours.

## 2016-07-22 ENCOUNTER — Telehealth: Payer: Self-pay | Admitting: Hematology

## 2016-07-22 ENCOUNTER — Ambulatory Visit (HOSPITAL_BASED_OUTPATIENT_CLINIC_OR_DEPARTMENT_OTHER): Payer: Managed Care, Other (non HMO) | Admitting: Hematology

## 2016-07-22 ENCOUNTER — Encounter: Payer: Self-pay | Admitting: *Deleted

## 2016-07-22 VITALS — BP 126/80 | HR 84 | Temp 98.0°F | Resp 17 | Ht 63.0 in | Wt 178.4 lb

## 2016-07-22 DIAGNOSIS — C20 Malignant neoplasm of rectum: Secondary | ICD-10-CM

## 2016-07-22 NOTE — Telephone Encounter (Signed)
Appointments scheduled per 11/22 LOS. Patient had to leave before she was able to schedule her appointments. Called patient to schedule future appointments.
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
## 2016-07-22 NOTE — Progress Notes (Signed)
Oncology Nurse Navigator Documentation  Oncology Nurse Navigator Flowsheets 07/22/2016  Navigator Location [facility]  Referral date to RadOnc/MedOnc -  Navigator Encounter Type Follow-up Appt  Telephone -  Abnormal Finding Date -  Confirmed Diagnosis Date -  Surgery Date 07/02/2016  Patient Visit Type -  Treatment Phase -  Barriers/Navigation Needs -Called to room by MD to provide ostomy supplies--patient is running low and has not gotten delivery yet from supplier. Provided ostomy bags and barrier rings from supply closet. Hopes to have her ostomy reversed in 3 months.  Education -  Interventions -  Referrals -  Education Method -  Support Groups/Services -  Specialty Items/DME Ostomy supplies  Acuity Level 1  Time Spent with Patient 15

## 2016-07-23 ENCOUNTER — Encounter: Payer: Self-pay | Admitting: Hematology

## 2016-08-10 ENCOUNTER — Ambulatory Visit: Payer: Self-pay | Admitting: Surgery

## 2016-08-17 ENCOUNTER — Other Ambulatory Visit: Payer: Self-pay | Admitting: Surgery

## 2016-08-17 DIAGNOSIS — Z932 Ileostomy status: Secondary | ICD-10-CM

## 2016-09-07 ENCOUNTER — Ambulatory Visit
Admission: RE | Admit: 2016-09-07 | Discharge: 2016-09-07 | Disposition: A | Discharge status: Home / Self Care | Payer: Managed Care, Other (non HMO) | Source: Ambulatory Visit | Attending: Surgery | Admitting: Surgery

## 2016-09-07 DIAGNOSIS — Z932 Ileostomy status: Secondary | ICD-10-CM

## 2016-09-09 ENCOUNTER — Other Ambulatory Visit: Payer: Self-pay | Admitting: Surgery

## 2016-09-09 DIAGNOSIS — Z932 Ileostomy status: Secondary | ICD-10-CM

## 2016-09-09 DIAGNOSIS — C20 Malignant neoplasm of rectum: Secondary | ICD-10-CM

## 2016-09-10 ENCOUNTER — Other Ambulatory Visit: Payer: Self-pay | Admitting: Radiology

## 2016-09-10 DIAGNOSIS — Z91041 Radiographic dye allergy status: Secondary | ICD-10-CM

## 2016-09-10 MED ORDER — PREDNISONE 50 MG PO TABS: 50.0000 mg | ORAL_TABLET | ORAL | 0 refills | Status: DC

## 2016-09-10 MED ORDER — BENADRYL 25 MG PO TABS: 50.0000 mg | ORAL_TABLET | ORAL | 0 refills | Status: DC

## 2016-09-11 ENCOUNTER — Ambulatory Visit
Admission: RE | Admit: 2016-09-11 | Discharge: 2016-09-11 | Disposition: A | Discharge status: Home / Self Care | Payer: Managed Care, Other (non HMO) | Source: Ambulatory Visit | Attending: Surgery | Admitting: Surgery

## 2016-09-11 ENCOUNTER — Other Ambulatory Visit: Payer: Managed Care, Other (non HMO)

## 2016-09-11 DIAGNOSIS — Z932 Ileostomy status: Secondary | ICD-10-CM

## 2016-09-11 DIAGNOSIS — C20 Malignant neoplasm of rectum: Secondary | ICD-10-CM

## 2016-09-11 MED ORDER — IOPAMIDOL (ISOVUE-300) INJECTION 61%
100.0000 mL | Freq: Once | INTRAVENOUS | Status: AC | PRN
  Administered 2016-09-11: 100 mL via INTRAVENOUS

## 2016-09-14 ENCOUNTER — Ambulatory Visit: Payer: Self-pay | Admitting: Surgery

## 2016-10-20 NOTE — Pre-Procedure Instructions (Signed)
[name]  10/20/2016      [id number] - [place], [place] - [address] [place] [address] Phone: [phone] Fax: [phone]    Your procedure is scheduled on Friday February 23.  Report to [facility] Admitting at 11:00 A.M.  Call this number if you have problems the morning of surgery:  [phone]   Remember:  Do not eat food or drink liquids after midnight.  Take these medicines the morning of surgery with A SIP OF WATER: acetaminophen (tylenol) if needed, oxycodone if needed  7 days prior to surgery STOP taking any Aspirin, Aleve, Naproxen, Ibuprofen, Motrin, Advil, Goody's, BC's, all herbal medications, fish oil, and all vitamins    Do not wear jewelry, make-up or nail polish.  Do not wear lotions, powders, or perfumes, or deoderant.  Do not shave 48 hours prior to surgery.  Men may shave face and neck.  Do not bring valuables to the hospital.  [facility] is not responsible for any belongings or valuables.  Contacts, dentures or bridgework may not be worn into surgery.  Leave your suitcase in the car.  After surgery it may be brought to your room.  For patients admitted to the hospital, discharge time will be determined by your treatment team.  Patients discharged the day of surgery will not be allowed to drive home.    Special instructions:    [place]- Preparing For Surgery  Before surgery, you can play an important role. Because skin is not sterile, your skin needs to be as free of germs as possible. You can reduce the number of germs on your skin by washing with CHG (chlorahexidine gluconate) Soap before surgery.  CHG is an antiseptic cleaner which kills germs and bonds with the skin to continue killing germs even after washing.  Please do not use if you have an allergy to CHG or antibacterial soaps. If your skin becomes reddened/irritated stop using the CHG.  Do not shave (including  legs and underarms) for at least 48 hours prior to first CHG shower. It is OK to shave your face.  Please follow these instructions carefully.   1. Shower the NIGHT BEFORE SURGERY and the MORNING OF SURGERY with CHG.   2. If you chose to wash your hair, wash your hair first as usual with your normal shampoo.  3. After you shampoo, rinse your hair and body thoroughly to remove the shampoo.  4. Use CHG as you would any other liquid soap. You can apply CHG directly to the skin and wash gently with a scrungie or a clean washcloth.   5. Apply the CHG Soap to your body ONLY FROM THE NECK DOWN.  Do not use on open wounds or open sores. Avoid contact with your eyes, ears, mouth and genitals (private parts). Wash genitals (private parts) with your normal soap.  6. Wash thoroughly, paying special attention to the area where your surgery will be performed.  7. Thoroughly rinse your body with warm water from the neck down.  8. DO NOT shower/wash with your normal soap after using and rinsing off the CHG Soap.  9. Pat yourself dry with a CLEAN TOWEL.   10. Wear CLEAN PAJAMAS   11. Place CLEAN SHEETS on your bed the night of your first shower and DO NOT SLEEP WITH PETS.    Day of Surgery: Do not apply any deodorants/lotions. Please wear clean clothes  to the hospital/surgery center.
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
## 2016-10-21 ENCOUNTER — Other Ambulatory Visit: Payer: Self-pay

## 2016-10-21 ENCOUNTER — Encounter (HOSPITAL_COMMUNITY)
Admission: RE | Admit: 2016-10-21 | Discharge: 2016-10-21 | Disposition: A | Discharge status: Home / Self Care | Payer: Managed Care, Other (non HMO) | Source: Ambulatory Visit | Attending: Surgery | Admitting: Surgery

## 2016-10-21 ENCOUNTER — Telehealth: Payer: Self-pay | Admitting: Hematology

## 2016-10-21 ENCOUNTER — Encounter: Payer: Self-pay | Admitting: Hematology

## 2016-10-21 ENCOUNTER — Encounter (HOSPITAL_COMMUNITY): Payer: Self-pay

## 2016-10-21 ENCOUNTER — Ambulatory Visit (HOSPITAL_BASED_OUTPATIENT_CLINIC_OR_DEPARTMENT_OTHER): Payer: Managed Care, Other (non HMO) | Admitting: Hematology

## 2016-10-21 ENCOUNTER — Other Ambulatory Visit (HOSPITAL_BASED_OUTPATIENT_CLINIC_OR_DEPARTMENT_OTHER): Payer: Managed Care, Other (non HMO)

## 2016-10-21 VITALS — BP 132/75 | HR 65 | Temp 98.1°F | Resp 18 | Ht 63.0 in | Wt 189.0 lb

## 2016-10-21 DIAGNOSIS — Z01810 Encounter for preprocedural cardiovascular examination: Secondary | ICD-10-CM | POA: Insufficient documentation

## 2016-10-21 DIAGNOSIS — Z932 Ileostomy status: Secondary | ICD-10-CM

## 2016-10-21 DIAGNOSIS — C20 Malignant neoplasm of rectum: Secondary | ICD-10-CM | POA: Diagnosis not present

## 2016-10-21 DIAGNOSIS — E876 Hypokalemia: Secondary | ICD-10-CM | POA: Insufficient documentation

## 2016-10-21 DIAGNOSIS — Z01812 Encounter for preprocedural laboratory examination: Secondary | ICD-10-CM | POA: Insufficient documentation

## 2016-10-21 DIAGNOSIS — E669 Obesity, unspecified: Secondary | ICD-10-CM

## 2016-10-21 LAB — CBC
HEMATOCRIT: 37.6 % (ref 36.0–46.0)
Hemoglobin: 11.8 g/dL — ABNORMAL LOW (ref 12.0–15.0)
MCH: 26 pg (ref 26.0–34.0)
MCHC: 31.4 g/dL (ref 30.0–36.0)
MCV: 83 fL (ref 78.0–100.0)
Platelets: 264 10*3/uL (ref 150–400)
RBC: 4.53 MIL/uL (ref 3.87–5.11)
RDW: 16.1 % — AB (ref 11.5–15.5)
WBC: 9.4 10*3/uL (ref 4.0–10.5)

## 2016-10-21 LAB — BASIC METABOLIC PANEL
Anion gap: 10 (ref 5–15)
BUN: 21 mg/dL — ABNORMAL HIGH (ref 6–20)
CALCIUM: 9.6 mg/dL (ref 8.9–10.3)
CO2: 26 mmol/L (ref 22–32)
CREATININE: 0.69 mg/dL (ref 0.44–1.00)
Chloride: 106 mmol/L (ref 101–111)
GFR calc Af Amer: >60 mL/min (ref >60)
GLUCOSE: 99 mg/dL (ref 65–99)
Potassium: 3.8 mmol/L (ref 3.5–5.1)
Sodium: 142 mmol/L (ref 135–145)

## 2016-10-21 LAB — CBC WITH DIFFERENTIAL/PLATELET
BASO%: 0.6 % (ref 0.0–2.0)
BASOS ABS: 0.1 10*3/uL (ref 0.0–0.1)
EOS%: 1.2 % (ref 0.0–7.0)
Eosinophils Absolute: 0.1 10*3/uL (ref 0.0–0.5)
HEMATOCRIT: 36.1 % (ref 34.8–46.6)
HGB: 11.9 g/dL (ref 11.6–15.9)
LYMPH%: 22.6 % (ref 14.0–49.7)
MCH: 26.7 pg (ref 25.1–34.0)
MCHC: 33.1 g/dL (ref 31.5–36.0)
MCV: 80.6 fL (ref 79.5–101.0)
MONO#: 0.7 10*3/uL (ref 0.1–0.9)
MONO%: 7.8 % (ref 0.0–14.0)
NEUT#: 6 10*3/uL (ref 1.5–6.5)
NEUT%: 67.8 % (ref 38.4–76.8)
PLATELETS: 292 10*3/uL (ref 145–400)
RBC: 4.47 10*6/uL (ref 3.70–5.45)
RDW: 16.4 % — ABNORMAL HIGH (ref 11.2–14.5)
WBC: 8.8 10*3/uL (ref 3.9–10.3)
lymph#: 2 10*3/uL (ref 0.9–3.3)

## 2016-10-21 LAB — CEA (IN HOUSE-CHCC): CEA (CHCC-IN HOUSE): 1.5 ng/mL (ref 0.00–5.00)

## 2016-10-21 LAB — COMPREHENSIVE METABOLIC PANEL
ALT: 18 U/L (ref 0–55)
ANION GAP: 8 meq/L (ref 3–11)
AST: 17 U/L (ref 5–34)
Albumin: 3.7 g/dL (ref 3.5–5.0)
Alkaline Phosphatase: 74 U/L (ref 40–150)
BUN: 23.9 mg/dL (ref 7.0–26.0)
CHLORIDE: 108 meq/L (ref 98–109)
CO2: 26 mEq/L (ref 22–29)
Calcium: 10 mg/dL (ref 8.4–10.4)
Creatinine: 0.8 mg/dL (ref 0.6–1.1)
EGFR: 84 mL/min/{1.73_m2} — ABNORMAL LOW (ref >90)
Glucose: 97 mg/dl (ref 70–140)
POTASSIUM: 4.1 meq/L (ref 3.5–5.1)
Sodium: 142 mEq/L (ref 136–145)
Total Bilirubin: 0.38 mg/dL (ref 0.20–1.20)
Total Protein: 7.4 g/dL (ref 6.4–8.3)

## 2016-10-21 MED ORDER — CHLORHEXIDINE GLUCONATE CLOTH 2 % EX PADS: 6.0000 | MEDICATED_PAD | Freq: Once | CUTANEOUS | Status: DC

## 2016-10-21 NOTE — Telephone Encounter (Signed)
Appointments scheduled per 2/21 LOS. Patient given AVS report and calendars with future scheduled appointments. °

## 2016-10-21 NOTE — Progress Notes (Signed)
PCP: Elon Alas  No cardiologist currently. Pt states she was checked out by a cardiologist >5 years ago for flutters in chest. Pt states she wore a heart monitor, stress test and EKG were done and everything was normal. Pt does not remember which MD she saw, but it was "across the street from [facility]". Pt states she still has these flutters but no dizziness, shortness of breath or chest pain with them. Pt says they last a few seconds then go away, and had flutters on the way to PAT appointment this AM.   VSS, no chest pain, flutters, SOB or dizziness at PAT appointment, no signs of infection per pt. Pt states her gums feel like they are "numb" and her hair has been falling out easier recently. Pt states she will call PCP about this today.   EKG: 10/21/16 Pt was here for surgery in November and reports no problems with anesthesia. Discussed with Dr. Lissa Hoard briefly about "flutters", and no need to talk to pt at PAT appointment. Will forward to anesthesia for review of EKG and chart.

## 2016-10-22 MED ORDER — ACETAMINOPHEN 500 MG PO TABS
1000.0000 mg | ORAL_TABLET | ORAL | Status: AC
Start: 2016-10-23 — End: 2016-10-23
  Administered 2016-10-23: 1000 mg via ORAL
  Filled 2016-10-22: qty 2

## 2016-10-22 MED ORDER — SODIUM CHLORIDE 0.9 % IV SOLN
INTRAVENOUS | Status: DC
  Filled 2016-10-22: qty 6

## 2016-10-22 MED ORDER — BUPIVACAINE LIPOSOME 1.3 % IJ SUSP
20.0000 mL | INTRAMUSCULAR | Status: AC
  Administered 2016-10-23: 20 mL
  Filled 2016-10-22: qty 20

## 2016-10-22 MED ORDER — CELECOXIB 200 MG PO CAPS
400.0000 mg | ORAL_CAPSULE | ORAL | Status: AC
  Administered 2016-10-23: 400 mg via ORAL
  Filled 2016-10-22: qty 2

## 2016-10-22 MED ORDER — GABAPENTIN 300 MG PO CAPS
300.0000 mg | ORAL_CAPSULE | ORAL | Status: AC
  Administered 2016-10-23: 300 mg via ORAL
  Filled 2016-10-22: qty 1

## 2016-10-22 MED ORDER — CEFOTETAN DISODIUM-DEXTROSE 2-2.08 GM-% IV SOLR
2.0000 g | INTRAVENOUS | Status: AC
  Administered 2016-10-23: 2 g via INTRAVENOUS
  Filled 2016-10-22: qty 50
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
## 2016-10-23 ENCOUNTER — Encounter (HOSPITAL_COMMUNITY)
Admission: RE | Disposition: A | Discharge status: Home Health | Payer: Self-pay | Source: Ambulatory Visit | Attending: Surgery

## 2016-10-23 ENCOUNTER — Inpatient Hospital Stay (HOSPITAL_COMMUNITY): Payer: Managed Care, Other (non HMO) | Admitting: Anesthesiology

## 2016-10-23 ENCOUNTER — Inpatient Hospital Stay (HOSPITAL_COMMUNITY)
Admission: RE | Admit: 2016-10-23 | Discharge: 2016-10-25 | DRG: 331 | Disposition: A | Discharge status: Home Health | Payer: Managed Care, Other (non HMO) | Source: Ambulatory Visit | Attending: Surgery | Admitting: Surgery

## 2016-10-23 ENCOUNTER — Encounter (HOSPITAL_COMMUNITY): Payer: Self-pay | Admitting: *Deleted

## 2016-10-23 ENCOUNTER — Inpatient Hospital Stay (HOSPITAL_COMMUNITY): Payer: Managed Care, Other (non HMO) | Admitting: Vascular Surgery

## 2016-10-23 DIAGNOSIS — Z85048 Personal history of other malignant neoplasm of rectum, rectosigmoid junction, and anus: Secondary | ICD-10-CM

## 2016-10-23 DIAGNOSIS — Z932 Ileostomy status: Secondary | ICD-10-CM

## 2016-10-23 DIAGNOSIS — C20 Malignant neoplasm of rectum: Secondary | ICD-10-CM | POA: Diagnosis present

## 2016-10-23 DIAGNOSIS — Z800 Family history of malignant neoplasm of digestive organs: Secondary | ICD-10-CM | POA: Diagnosis not present

## 2016-10-23 DIAGNOSIS — Z6831 Body mass index (BMI) 31.0-31.9, adult: Secondary | ICD-10-CM | POA: Diagnosis not present

## 2016-10-23 DIAGNOSIS — E669 Obesity, unspecified: Secondary | ICD-10-CM | POA: Diagnosis present

## 2016-10-23 DIAGNOSIS — Z432 Encounter for attention to ileostomy: Principal | ICD-10-CM

## 2016-10-23 DIAGNOSIS — Z9889 Other specified postprocedural states: Secondary | ICD-10-CM

## 2016-10-23 HISTORY — DX: Benign neoplasm of rectum: D12.8

## 2016-10-23 HISTORY — PX: ILEOSTOMY CLOSURE: SHX1784

## 2016-10-23 HISTORY — DX: Ileostomy status: Z93.2

## 2016-10-23 SURGERY — CLOSURE, ILEOSTOMY
Anesthesia: General | Site: Abdomen

## 2016-10-23 MED ORDER — SODIUM CHLORIDE 0.9 % IJ SOLN
INTRAMUSCULAR | Status: DC | PRN
  Administered 2016-10-23: 40 mL

## 2016-10-23 MED ORDER — SACCHAROMYCES BOULARDII 250 MG PO CAPS
250.0000 mg | ORAL_CAPSULE | Freq: Two times a day (BID) | ORAL | Status: DC
  Administered 2016-10-24 (×2): 250 mg via ORAL
  Filled 2016-10-23 (×2): qty 1

## 2016-10-23 MED ORDER — DEXTROSE 5 % IV SOLN
INTRAVENOUS | Status: DC | PRN
  Administered 2016-10-23: 20 ug/min via INTRAVENOUS

## 2016-10-23 MED ORDER — MENTHOL 3 MG MT LOZG: 1.0000 | LOZENGE | OROMUCOSAL | Status: DC | PRN

## 2016-10-23 MED ORDER — ONDANSETRON HCL 4 MG/2ML IJ SOLN
INTRAMUSCULAR | Status: DC | PRN
  Administered 2016-10-23: 4 mg via INTRAVENOUS

## 2016-10-23 MED ORDER — LACTATED RINGERS IV SOLN
INTRAVENOUS | Status: DC
  Administered 2016-10-23 – 2016-10-24 (×2): via INTRAVENOUS

## 2016-10-23 MED ORDER — ADULT MULTIVITAMIN W/MINERALS CH
1.0000 | ORAL_TABLET | ORAL | Status: DC
  Administered 2016-10-23: 1 via ORAL
  Filled 2016-10-23: qty 1

## 2016-10-23 MED ORDER — SUGAMMADEX SODIUM 200 MG/2ML IV SOLN
INTRAVENOUS | Status: AC
  Filled 2016-10-23: qty 2

## 2016-10-23 MED ORDER — ROCURONIUM BROMIDE 10 MG/ML (PF) SYRINGE
PREFILLED_SYRINGE | INTRAVENOUS | Status: DC | PRN
  Administered 2016-10-23: 50 mg via INTRAVENOUS
  Administered 2016-10-23: 20 mg via INTRAVENOUS

## 2016-10-23 MED ORDER — ACETAMINOPHEN 500 MG PO TABS
1000.0000 mg | ORAL_TABLET | Freq: Three times a day (TID) | ORAL | Status: DC
  Administered 2016-10-23 – 2016-10-25 (×5): 1000 mg via ORAL
  Filled 2016-10-23 (×5): qty 2

## 2016-10-23 MED ORDER — MAGIC MOUTHWASH: 15.0000 mL | Freq: Four times a day (QID) | ORAL | Status: DC | PRN

## 2016-10-23 MED ORDER — PROPOFOL 1000 MG/100ML IV EMUL
INTRAVENOUS | Status: AC
  Filled 2016-10-23: qty 200

## 2016-10-23 MED ORDER — SUGAMMADEX SODIUM 200 MG/2ML IV SOLN
INTRAVENOUS | Status: DC | PRN
  Administered 2016-10-23: 180 mg via INTRAVENOUS

## 2016-10-23 MED ORDER — HYDROMORPHONE HCL 1 MG/ML IJ SOLN
INTRAMUSCULAR | Status: AC
  Filled 2016-10-23: qty 0.5

## 2016-10-23 MED ORDER — DIPHENHYDRAMINE HCL 50 MG/ML IJ SOLN: 12.5000 mg | Freq: Four times a day (QID) | INTRAMUSCULAR | Status: DC | PRN

## 2016-10-23 MED ORDER — PROPOFOL 500 MG/50ML IV EMUL
INTRAVENOUS | Status: AC
  Filled 2016-10-23: qty 100

## 2016-10-23 MED ORDER — PHENYLEPHRINE 40 MCG/ML (10ML) SYRINGE FOR IV PUSH (FOR BLOOD PRESSURE SUPPORT)
PREFILLED_SYRINGE | INTRAVENOUS | Status: DC | PRN
  Administered 2016-10-23: 80 ug via INTRAVENOUS

## 2016-10-23 MED ORDER — MEPERIDINE HCL 25 MG/ML IJ SOLN: 6.2500 mg | INTRAMUSCULAR | Status: DC | PRN

## 2016-10-23 MED ORDER — IBUPROFEN 400 MG PO TABS
400.0000 mg | ORAL_TABLET | Freq: Three times a day (TID) | ORAL | Status: DC | PRN
  Administered 2016-10-24: 400 mg via ORAL
  Administered 2016-10-25: 600 mg via ORAL
  Filled 2016-10-23: qty 1
  Filled 2016-10-23: qty 2

## 2016-10-23 MED ORDER — ONDANSETRON HCL 4 MG/2ML IJ SOLN
INTRAMUSCULAR | Status: AC
  Filled 2016-10-23: qty 2

## 2016-10-23 MED ORDER — ROCURONIUM BROMIDE 50 MG/5ML IV SOSY
PREFILLED_SYRINGE | INTRAVENOUS | Status: AC
  Filled 2016-10-23: qty 10

## 2016-10-23 MED ORDER — OXYCODONE HCL 5 MG PO TABS: 5.0000 mg | ORAL_TABLET | ORAL | 0 refills | Status: DC | PRN

## 2016-10-23 MED ORDER — LACTATED RINGERS IV BOLUS (SEPSIS): 1000.0000 mL | Freq: Three times a day (TID) | INTRAVENOUS | Status: DC | PRN

## 2016-10-23 MED ORDER — MIDAZOLAM HCL 2 MG/2ML IJ SOLN
INTRAMUSCULAR | Status: AC
  Filled 2016-10-23: qty 2

## 2016-10-23 MED ORDER — OXYCODONE HCL 5 MG PO TABS
5.0000 mg | ORAL_TABLET | ORAL | Status: DC | PRN
  Administered 2016-10-25: 10 mg via ORAL
  Filled 2016-10-23: qty 2

## 2016-10-23 MED ORDER — PROCHLORPERAZINE EDISYLATE 5 MG/ML IJ SOLN
5.0000 mg | INTRAMUSCULAR | Status: DC | PRN
  Administered 2016-10-23: 10 mg via INTRAVENOUS
  Filled 2016-10-23: qty 2

## 2016-10-23 MED ORDER — PROPOFOL 10 MG/ML IV BOLUS
INTRAVENOUS | Status: DC | PRN
  Administered 2016-10-23: 110 mg via INTRAVENOUS

## 2016-10-23 MED ORDER — ENALAPRILAT 1.25 MG/ML IV SOLN
0.6250 mg | Freq: Four times a day (QID) | INTRAVENOUS | Status: DC | PRN
  Filled 2016-10-23: qty 1

## 2016-10-23 MED ORDER — ALUM & MAG HYDROXIDE-SIMETH 200-200-20 MG/5ML PO SUSP: 30.0000 mL | Freq: Four times a day (QID) | ORAL | Status: DC | PRN

## 2016-10-23 MED ORDER — DIPHENHYDRAMINE HCL 12.5 MG/5ML PO ELIX: 12.5000 mg | ORAL_SOLUTION | Freq: Four times a day (QID) | ORAL | Status: DC | PRN

## 2016-10-23 MED ORDER — PROPOFOL 10 MG/ML IV BOLUS
INTRAVENOUS | Status: AC
  Filled 2016-10-23: qty 20

## 2016-10-23 MED ORDER — FENTANYL CITRATE (PF) 100 MCG/2ML IJ SOLN
12.5000 ug | INTRAMUSCULAR | Status: DC | PRN
  Administered 2016-10-23 – 2016-10-24 (×8): 25 ug via INTRAVENOUS
  Filled 2016-10-23 (×8): qty 2

## 2016-10-23 MED ORDER — LACTATED RINGERS IV SOLN
INTRAVENOUS | Status: DC
  Administered 2016-10-23: 12:00:00 via INTRAVENOUS

## 2016-10-23 MED ORDER — LACTATED RINGERS IV SOLN
INTRAVENOUS | Status: DC | PRN
  Administered 2016-10-23: 12:00:00 via INTRAVENOUS

## 2016-10-23 MED ORDER — METHOCARBAMOL 1000 MG/10ML IJ SOLN
1000.0000 mg | Freq: Four times a day (QID) | INTRAMUSCULAR | Status: DC | PRN
  Filled 2016-10-23: qty 10

## 2016-10-23 MED ORDER — EPHEDRINE SULFATE-NACL 50-0.9 MG/10ML-% IV SOSY
PREFILLED_SYRINGE | INTRAVENOUS | Status: DC | PRN
  Administered 2016-10-23: 5 mg via INTRAVENOUS
  Administered 2016-10-23: 10 mg via INTRAVENOUS

## 2016-10-23 MED ORDER — WITCH HAZEL-GLYCERIN EX PADS
1.0000 "application " | MEDICATED_PAD | CUTANEOUS | Status: DC | PRN
  Administered 2016-10-24: 1 via TOPICAL
  Filled 2016-10-23: qty 100

## 2016-10-23 MED ORDER — DEXTROSE 5 % IV SOLN
2.0000 g | Freq: Two times a day (BID) | INTRAVENOUS | Status: AC
  Administered 2016-10-24: 2 g via INTRAVENOUS
  Filled 2016-10-23: qty 2

## 2016-10-23 MED ORDER — PHENOL 1.4 % MT LIQD: 2.0000 | OROMUCOSAL | Status: DC | PRN

## 2016-10-23 MED ORDER — METHOCARBAMOL 500 MG PO TABS: 1000.0000 mg | ORAL_TABLET | Freq: Four times a day (QID) | ORAL | Status: DC | PRN

## 2016-10-23 MED ORDER — HYDROMORPHONE HCL 1 MG/ML IJ SOLN
0.2500 mg | INTRAMUSCULAR | Status: DC | PRN
  Administered 2016-10-23 (×4): 0.5 mg via INTRAVENOUS

## 2016-10-23 MED ORDER — METOCLOPRAMIDE HCL 5 MG/ML IJ SOLN: 10.0000 mg | Freq: Once | INTRAMUSCULAR | Status: DC | PRN

## 2016-10-23 MED ORDER — SODIUM CHLORIDE 0.9 % IV SOLN
0.0125 ug/kg/min | INTRAVENOUS | Status: AC
  Administered 2016-10-23: .2 ug/kg/min via INTRAVENOUS
  Filled 2016-10-23: qty 2000

## 2016-10-23 MED ORDER — ALVIMOPAN 12 MG PO CAPS
12.0000 mg | ORAL_CAPSULE | Freq: Two times a day (BID) | ORAL | Status: DC
  Administered 2016-10-24: 12 mg via ORAL
  Filled 2016-10-23 (×2): qty 1

## 2016-10-23 MED ORDER — LIP MEDEX EX OINT
1.0000 | TOPICAL_OINTMENT | Freq: Two times a day (BID) | CUTANEOUS | Status: DC
Start: 2016-10-23 — End: 2016-10-23
  Filled 2016-10-23: qty 7

## 2016-10-23 MED ORDER — BLISTEX MEDICATED EX OINT
TOPICAL_OINTMENT | CUTANEOUS | Status: DC | PRN
  Filled 2016-10-23: qty 6.3

## 2016-10-23 MED ORDER — SODIUM CHLORIDE 0.9 % IV SOLN
INTRAVENOUS | Status: DC | PRN
  Administered 2016-10-23: 13:00:00 via INTRAVENOUS

## 2016-10-23 MED ORDER — MIDAZOLAM HCL 5 MG/5ML IJ SOLN
INTRAMUSCULAR | Status: DC | PRN
  Administered 2016-10-23: 2 mg via INTRAVENOUS

## 2016-10-23 MED ORDER — ENOXAPARIN SODIUM 40 MG/0.4ML ~~LOC~~ SOLN
40.0000 mg | SUBCUTANEOUS | Status: DC
  Administered 2016-10-24 – 2016-10-25 (×2): 40 mg via SUBCUTANEOUS
  Filled 2016-10-23 (×2): qty 0.4

## 2016-10-23 MED ORDER — HYDRALAZINE HCL 20 MG/ML IJ SOLN: 5.0000 mg | Freq: Four times a day (QID) | INTRAMUSCULAR | Status: DC | PRN

## 2016-10-23 MED ORDER — EPHEDRINE 5 MG/ML INJ
INTRAVENOUS | Status: AC
  Filled 2016-10-23: qty 10

## 2016-10-23 MED ORDER — ONDANSETRON HCL 4 MG PO TABS
4.0000 mg | ORAL_TABLET | Freq: Four times a day (QID) | ORAL | Status: DC | PRN
Start: 2016-10-23 — End: 2016-10-25

## 2016-10-23 MED ORDER — ONDANSETRON HCL 4 MG/2ML IJ SOLN
4.0000 mg | Freq: Four times a day (QID) | INTRAMUSCULAR | Status: DC | PRN
  Administered 2016-10-23 – 2016-10-25 (×2): 4 mg via INTRAVENOUS
  Filled 2016-10-23 (×2): qty 2

## 2016-10-23 MED ORDER — LIDOCAINE 2% (20 MG/ML) 5 ML SYRINGE
INTRAMUSCULAR | Status: DC | PRN
  Administered 2016-10-23: 60 mg via INTRAVENOUS

## 2016-10-23 MED ORDER — METOPROLOL TARTRATE 5 MG/5ML IV SOLN: 5.0000 mg | Freq: Four times a day (QID) | INTRAVENOUS | Status: DC | PRN

## 2016-10-23 MED ORDER — PROPOFOL 500 MG/50ML IV EMUL
INTRAVENOUS | Status: DC | PRN
  Administered 2016-10-23: 75 ug/kg/min via INTRAVENOUS

## 2016-10-23 MED ORDER — FENTANYL CITRATE (PF) 100 MCG/2ML IJ SOLN
INTRAMUSCULAR | Status: AC
Start: 2016-10-23 — End: 2016-10-23
  Filled 2016-10-23: qty 4

## 2016-10-23 MED ORDER — PHENYLEPHRINE 40 MCG/ML (10ML) SYRINGE FOR IV PUSH (FOR BLOOD PRESSURE SUPPORT)
PREFILLED_SYRINGE | INTRAVENOUS | Status: AC
  Filled 2016-10-23: qty 10

## 2016-10-23 MED ORDER — ENSURE ENLIVE PO LIQD
237.0000 mL | Freq: Two times a day (BID) | ORAL | Status: DC
  Administered 2016-10-24: 237 mL via ORAL

## 2016-10-23 MED ORDER — FENTANYL CITRATE (PF) 100 MCG/2ML IJ SOLN
INTRAMUSCULAR | Status: DC | PRN
  Administered 2016-10-23 (×4): 50 ug via INTRAVENOUS

## 2016-10-23 MED ORDER — 0.9 % SODIUM CHLORIDE (POUR BTL) OPTIME
TOPICAL | Status: DC | PRN
  Administered 2016-10-23: 2000 mL

## 2016-10-23 MED ORDER — LIDOCAINE 2% (20 MG/ML) 5 ML SYRINGE
INTRAMUSCULAR | Status: AC
  Filled 2016-10-23: qty 5

## 2016-10-23 SURGICAL SUPPLY — 48 items
BLADE CLIPPER SURG (BLADE) IMPLANT
CANISTER SUCT 3000ML PPV (MISCELLANEOUS) ×2 IMPLANT
COVER SURGICAL LIGHT HANDLE (MISCELLANEOUS) ×2 IMPLANT
DRAPE LAPAROSCOPIC ABDOMINAL (DRAPES) ×2 IMPLANT
DRAPE WARM FLUID 44X44 (DRAPE) ×2 IMPLANT
DRSG OPSITE POSTOP 4X6 (GAUZE/BANDAGES/DRESSINGS) ×1 IMPLANT
ELECT BLADE 6.5 EXT (BLADE) ×1 IMPLANT
ELECT REM PT RETURN 9FT ADLT (ELECTROSURGICAL) ×2
ELECTRODE REM PT RTRN 9FT ADLT (ELECTROSURGICAL) ×1 IMPLANT
GAUZE SPONGE 4X4 12PLY STRL (GAUZE/BANDAGES/DRESSINGS) ×2 IMPLANT
GLOVE BIOGEL PI IND STRL 8 (GLOVE) ×1 IMPLANT
GLOVE BIOGEL PI INDICATOR 8 (GLOVE) ×1
GLOVE ECLIPSE 8.0 STRL XLNG CF (GLOVE) ×4 IMPLANT
GOWN STRL REUS W/ TWL LRG LVL3 (GOWN DISPOSABLE) ×2 IMPLANT
GOWN STRL REUS W/ TWL XL LVL3 (GOWN DISPOSABLE) ×1 IMPLANT
GOWN STRL REUS W/TWL LRG LVL3 (GOWN DISPOSABLE) ×4
GOWN STRL REUS W/TWL XL LVL3 (GOWN DISPOSABLE) ×6
KIT BASIN OR (CUSTOM PROCEDURE TRAY) ×2 IMPLANT
KIT ROOM TURNOVER OR (KITS) ×2 IMPLANT
LIGASURE IMPACT 36 18CM CVD LR (INSTRUMENTS) IMPLANT
NDL 18GX1X1/2 (RX/OR ONLY) (NEEDLE) IMPLANT
NEEDLE 18GX1X1/2 (RX/OR ONLY) (NEEDLE) ×2 IMPLANT
NS IRRIG 1000ML POUR BTL (IV SOLUTION) ×4 IMPLANT
PACK GENERAL/GYN (CUSTOM PROCEDURE TRAY) ×2 IMPLANT
PAD ARMBOARD 7.5X6 YLW CONV (MISCELLANEOUS) ×4 IMPLANT
RELOAD PROXIMATE 75MM BLUE (ENDOMECHANICALS) ×4 IMPLANT
RELOAD STAPLE 75 3.8 BLU REG (ENDOMECHANICALS) IMPLANT
SPECIMEN JAR X LARGE (MISCELLANEOUS) ×1 IMPLANT
SPONGE LAP 18X18 X RAY DECT (DISPOSABLE) ×2 IMPLANT
STAPLER GUN LINEAR PROX 60 (STAPLE) ×1 IMPLANT
STAPLER PROXIMATE 75MM BLUE (STAPLE) ×1 IMPLANT
STAPLER VISISTAT 35W (STAPLE) ×2 IMPLANT
SUT MNCRL AB 4-0 PS2 18 (SUTURE) ×1 IMPLANT
SUT PDS AB 1 TP1 96 (SUTURE) ×4 IMPLANT
SUT SILK 2 0 SH (SUTURE) ×1 IMPLANT
SUT SILK 2 0 SH CR/8 (SUTURE) ×7 IMPLANT
SUT SILK 2 0 TIES 10X30 (SUTURE) ×2 IMPLANT
SUT SILK 3 0 SH CR/8 (SUTURE) ×2 IMPLANT
SUT SILK 3 0 TIES 10X30 (SUTURE) ×2 IMPLANT
SUT VIC AB 2-0 SH 18 (SUTURE) ×1 IMPLANT
SUT VIC AB 3-0 SH 18 (SUTURE) IMPLANT
SYRINGE 60CC LL (MISCELLANEOUS) ×1 IMPLANT
TAPE UMBILICAL COTTON 1/8X30 (MISCELLANEOUS) ×1 IMPLANT
TOWEL OR 17X24 6PK STRL BLUE (TOWEL DISPOSABLE) ×2 IMPLANT
TOWEL OR 17X26 10 PK STRL BLUE (TOWEL DISPOSABLE) ×2 IMPLANT
TRAY FOLEY CATH 14FRSI W/METER (CATHETERS) ×2 IMPLANT
WATER STERILE IRR 1000ML POUR (IV SOLUTION) ×2 IMPLANT
YANKAUER SUCT BULB TIP NO VENT (SUCTIONS) ×2 IMPLANT

## 2016-10-23 NOTE — Anesthesia Procedure Notes (Signed)
Procedure Name: Intubation Date/Time: 10/23/2016 2:01 PM Performed by: Garrison [place] T Pre-anesthesia Checklist: Patient identified, Emergency Drugs available, Suction available and Patient being monitored Patient Re-evaluated:Patient Re-evaluated prior to inductionOxygen Delivery Method: Circle System Utilized Preoxygenation: Pre-oxygenation with 100% oxygen Intubation Type: IV induction Ventilation: Mask ventilation without difficulty Laryngoscope Size: Miller and 2 Grade View: Grade I Tube type: Oral Tube size: 7.5 mm Number of attempts: 1 Airway Equipment and Method: Stylet and Oral airway Placement Confirmation: ETT inserted through vocal cords under direct vision,  positive ETCO2 and breath sounds checked- equal and bilateral Secured at: 22 cm Tube secured with: Tape Dental Injury: Teeth and Oropharynx as per pre-operative assessment

## 2016-10-23 NOTE — Op Note (Signed)
10/23/2016  3:57 PM  PATIENT:  [name]  61 y.o. 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  INDICATION: Pleasant patient status post low anterior resection with diverting loop ileostomy to protect the very distal anastomosis.  The patient has recovered from that surgery with the anastomosis well-healed.  It was felt safe to have the loop ileostomy taken down.  I discussed the procedure with the patient:  The anatomy & physiology of the digestive tract was discussed.  The pathophysiology was discussed.  Possibility of remaining with an ostomy permanently was discussed.  I offered ostomy takedown.  Laparoscopic & open techniques were discussed.   Risks such as bleeding, infection, abscess, leak, reoperation, possible re-ostomy, injury to other organs, hernia, heart attack, death, and other risks were discussed.   I noted a good likelihood this will help address the problem.  Goals of post-operative recovery were discussed as well.  We will work to minimize complications.  Questions were answered.  The patient expresses  understanding & wishes to proceed with surgery.  OR FINDINGS: Moderate adhesions to ileostomy in abdominal wall.  Moderate adhesions to anterior peritoneum.  Normal anatomy.  DESCRIPTION:   Informed consent was confirmed.   The patient received IV antibiotics & underwent general anesthesia without any difficulty.  Foley catheter was sterilely placed. SCDs were active during the entire case.  The abdomen was prepped and draped in a sterile fashion.  A surgical timeout confirmed our plan.  I made a curvilinear incision around the loop ileostomy in the right mid abdomen.  I got into the subcutaneous tissues.  I used careful focused right angle dissection and sharp dissection.  Some focused cautery dissection as well.  That helped to free adhesions to the subcutaneous tisses & fascia.  Gradually, I was able to enter into the peritoneum focally.  I did a gentle finger sweep.  Gradually came around circumferentially and freed the loop of ileum from remaining adhesions to the abdominal wall to 8cm circumferentially.  Dense but soft intraabdominal adhesions noted.  We were able to eviscerate some bowel proximally and distally.    I did a side-to-side stapled anastomosis using a 75 GIA.  Silk stitch placed at the crotch of the anastomosis. I used a TX stapler to staple off the common defect and resect the remaining ileum, most of it that involve the intra-abdominal ostomy component given the adhesions.  Took the mesentery with clamps and silk ties.  I closed the mesenteric defect using interrupted 2-0 silk stitches.  The anastomosis looked healthy and viable.   We returned the anastomosis into the abdominal cavity.  Finger sweep  circumferentially noted no adhesions.  It rested well.  We changed gloves and instruments.  Irrigated copiously into the wound and fascia.  I reapproximated the fascia transversely using running #1 PDS stitches from each corner to the center.  I irrigated into the subcutaneous tissues.    Because of her abdominal thickness, I did to a layer of interrupted 2-0 Vicryl deep dermal sutures to help close the wound transversely to close the dead space down.  I trimmed the corner dog-ears medially & laterally.  I reapproximated the skin using 4-0 Monocryl stitch running centrally.  I left the corners open.  I packed the corners with anitbiotic soaked umbilical tape for wicks.  Sterile dressing was applied.  Patient was extubated and is stable in the recovery room.  I discussed operative findings, updated the patient's status, discussed probable steps to recovery, and gave postoperative recommendations to the patient's family.  Recommendations were made.  Questions were answered.  They expressed understanding & appreciation.   [name], M.D., F.A.C.S. Gastrointestinal and Minimally Invasive Surgery [facility] [address], [place], [place] [address] [phone] Main / Paging

## 2016-10-23 NOTE — Anesthesia Postprocedure Evaluation (Signed)
Anesthesia Post Note  Patient: [name]  Procedure(s) Performed: Procedure(s) (LRB): LOOP ILEOSTOMY TAKEDOWN (N/A)  Patient location during evaluation: PACU Anesthesia Type: General Level of consciousness: awake and alert and oriented Pain management: pain level controlled Vital Signs Assessment: post-procedure vital signs reviewed and stable Respiratory status: spontaneous breathing, nonlabored ventilation, respiratory function stable and patient connected to nasal cannula oxygen Cardiovascular status: blood pressure returned to baseline and stable Postop Assessment: no signs of nausea or vomiting Anesthetic complications: no       Last Vitals:  Vitals:   10/23/16 1645 10/23/16 1652  BP:  (!) 147/76  Pulse: 60 65  Resp: 15 19  Temp:      Last Pain:  Vitals:   10/23/16 1700  TempSrc:   PainSc: 10-Worst pain ever                 Melba Araki A.

## 2016-10-23 NOTE — Transfer of Care (Signed)
Immediate Anesthesia Transfer of Care Note  Patient: [name]  Procedure(s) Performed: Procedure(s): LOOP ILEOSTOMY TAKEDOWN (N/A)  Patient Location: PACU  Anesthesia Type:General  Level of Consciousness: awake, alert  and oriented  Airway & Oxygen Therapy: Patient Spontanous Breathing and Patient connected to nasal cannula oxygen  Post-op Assessment: Report given to RN, Post -op Vital signs reviewed and stable and Patient moving all extremities X 4  Post vital signs: Reviewed and stable  Last Vitals:  Vitals:   10/23/16 1123 10/23/16 1608  BP: 127/75   Pulse: 63   Resp: 18   Temp: 36.4 C (P) 36.4 C    Last Pain:  Vitals:   10/23/16 1123  TempSrc: Oral         Complications: No apparent anesthesia complications

## 2016-10-23 NOTE — H&P (Signed)
[name] 09/14/2016 10:57 AM Location: [facility] Patient #: [id number] DOB: 01-13-1956 Married / Language: Cleophus Molt / Race: White Female  Patient Care Team: [name], NP as PCP - General (Obstetrics and Gynecology) [name], MD as Consulting Physician (General Surgery) [name], MD as Consulting Physician (Gastroenterology) [name], MD as Consulting Physician (Gynecology)   History of Present Illness [name]; 09/14/2016 11:22 AM) The patient is a 61 year old female who presents with colorectal cancer. Note for "Colorectal cancer": Patient comes in today status post redo low anterior resection and small bowel resection for rectal cancer in the setting of a recurrent polyp and delayed perforation requiring small bowel resection and mesh resection.  She comes in today with her husband. She is feeling pretty good. Appetite normal. She is emptying the ileostomy bag about 3 or 4 times a day. No fevers chills or sweats. No lightheadedness. No problems with pouching the bag or irritation. Noticed occasional mucousy stool-like drainage from the bottom but not severe. Tolerated the enemas just fine. No pain afterwards. Worried about pain and discomfort with ileostomy takedown. Hoping to have it done next month. Husband feels like she is definitely getting better.    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 Obesity (BMI 30-39.9) Hypokalemia Hypomagnesemia Protein-calorie malnutrition, severe   SURGERY 07/01/2016  POST-OPERATIVE DIAGNOSIS:   RECTAL CANCER LEFT OVARIAN MASS STRONG FAMILY HISTORY OF OVARIAN CANCER  PROCEDURE: : XI ROBOTIC ASSISTED REDO LOWER ANTERIOR RECTO-SIGMOID  RESECTION COLOANAL Handsewn ANASTOMOSIS SPLENIC FLEXURE MOBILIZATION BILATERAL SALPINGO-OOPHORECTOMY LAPAROSCOPIC & ROBOTIC LYSIS OF ADHESIONS X 2 HOURS (33% of case) DIVERTING LOOP ILEOSTOMY Cystoscopy with placement of bilateral lighted ureteral stents   SURGEON: [name], MD  12 Days Post-Op 07/02/2016  POST-OPERATIVE DIAGNOSIS: DELAYED SMALL BOWEL PERFORATION  PROCEDURE:  LAPAROSCOPY DIAGNOSTIC SMALL BOWEL RESECTION SEROSAL REPAIR SMALL BOWEL OMENTOPEXY  Surgeon(s): [name], MD   Problem List/Past Medical [name], MD; 09/14/2016 11:11 AM) ADENOMATOUS RECTAL POLYP (D12.8)  PREOP COLON - ENCOUNTER FOR PREOPERATIVE EXAMINATION FOR GENERAL SURGICAL PROCEDURE (Z01.818)  RECTAL CANCER (C20)  ILEOSTOMY IN PLACE (Z93.2)   Past Surgical History [name], MD; 09/14/2016 11:11 AM) Appendectomy  Cesarean Section - 1  Colon Polyp Removal - Open  Colon Removal - Partial  Gallbladder Surgery - Open   Diagnostic Studies History [name], MD; 09/14/2016 11:11 AM) Colonoscopy  within last year Mammogram  1-3 years ago Pap Smear  1-5 years ago  Allergies Mammie Lorenzo, LPN; 624THL QA348G AM) Santiago Bur *URINARY ANTI-INFECTIVES*   Medication History Mammie Lorenzo, LPN; 624THL QA348G AM) Multiple Vitamins (Oral) Active. Medications Reconciled  Social History [name], MD; 09/14/2016 11:11 AM) Caffeine use  Coffee. No alcohol use  No drug use  Tobacco use  Never smoker.  Family History [name], MD; 09/14/2016 11:11 AM) Bleeding disorder  Father. Ischemic Bowel Disease  Father. Ovarian Cancer  Family Members In General, Sister.  Pregnancy / Birth History [name], MD; 09/14/2016 11:11 AM) Age at menarche  48 years. Age of menopause  62-50 Contraceptive History  Oral contraceptives. Gravida  3 Maternal age  7-30 Para  3 Regular periods   Other Problems [name], MD;  09/14/2016 11:11 AM) Arthritis  Back Pain  Gastroesophageal Reflux Disease  DYSURIA (R30.0)  Vitals Claiborne Billings Dockery LPN; 624THL QA348G AM) 09/14/2016 10:57 AM Weight: 183 lb Height: 63in Body Surface Area: 1.86 m Body Mass Index: 32.42 kg/m  Temp.: 98.40F(Oral)  Pulse: 78 (Regular)  BP: 122/78 (Sitting, Left Arm, Standard)   BP 127/75   Pulse 63   Temp 97.5 F (36.4 C) (Oral)   Resp 18   Ht 5\' 3"  (1.6 m)   Wt 85.7 kg (189 lb)   SpO2 100%   BMI 33.48 kg/m      Physical Exam [name], MD; 09/14/2016 11:11 AM) General Mental Status-Alert. General Appearance-Not in acute distress. Voice-Normal.  Integumentary Global Assessment Normal Exam - Distribution of scalp and body hair is normal. General Characteristics Overall Skin Surface - no rashes and no suspicious lesions.  Head and Neck Head-normocephalic, atraumatic with no lesions or palpable masses. Face Global Assessment - atraumatic, no absence of expression. Neck Global Assessment - no abnormal movements, no decreased range of motion. Trachea-midline. Thyroid Gland Characteristics - non-tender.  Eye Eyeball - Left-Extraocular movements intact, No Nystagmus. Eyeball - Right-Extraocular movements intact, No Nystagmus. Upper Eyelid - Left-No Cyanotic. Upper Eyelid - Right-No Cyanotic.  Chest and Lung Exam Inspection Accessory muscles - No use of accessory muscles in breathing.  Abdomen Note: Wound VAC in place. Close to just superficial granulation. I removed. Right upper quadrant port site with 1 cm deep wound clean. I put a Band-Aid on it.  Red skin soft. Right lower quadrant ileostomy with thick succus in the bag. No leak or rash. Did not take off the bag today.  Abdomen soft. Nontender, nondistended. No guarding. No umbilical no other hernias   Rectal Note: Held off on any digital rectal exam today.   Peripheral Vascular Upper  Extremity Inspection - Left - Not Gangrenous, No Petechiae. Right - Not Gangrenous, No Petechiae.  Neurologic Neurologic evaluation reveals -normal attention span and ability to concentrate, able to name objects and repeat phrases. Appropriate fund of knowledge and normal coordination.  Neuropsychiatric Mental status exam performed with findings of-able to articulate well with normal speech/language, rate, volume and coherence and no evidence of hallucinations, delusions, obsessions or homicidal/suicidal ideation. Orientation-oriented X3.  Musculoskeletal Global Assessment Gait and Station - normal gait and station.  Lymphatic General Lymphatics Description - No Generalized lymphadenopathy.    Assessment & Plan  ILEOSTOMY IN PLACE (Z93.2) Impression: Recovering rather well 2 months status post redo low anterior resection with diverting loop ileostomy followed by small bowel resection and repaired omentopexy.  Initial plain film enema somewhat equivocal. No definite leak but anatomy hard to tell. CT enema shows no leak and anastomosis looks fine.  Continue plan for loop ileostomy takedown in the middle of next month. No need for any bowel prep since it is small intestine. Patient feels ready. Husband shook my hand in thanks. Doing better so far. Hopefully things will continue to get better. The patient is worried said she struggled with some crampy diarrhea after the last ileostomy takedown. Hopefully the fact that her loop ileostomy has been put makes it less likely. Reasonable to stay on a soft bland diet for the first week after surgery, but I hesitated on her staying on that indefinitely. She is cautiously optimistic  Ready for surgery    RECTAL CANCER (C20) Impression: Rectal cancer within a large polyp at/slightly distal to the colorectal anastomosis from prior resection for debulking rectal adenomatous polyp. Therefore, Status post TEM resection. T2 cancer. Status post  robotically-assisted low anterior resection with anastomosis and  diverting loop ileostomy. Required reoperation for delayed bowel breakdown that mesh.  Exercise and walk 1 hour / day.  Focus on nutrition.  Ileostomy takedown hopefully in February. PREOP COLON - ENCOUNTER FOR PREOPERATIVE EXAMINATION FOR GENERAL SURGICAL PROCEDURE (Z01.818) Current Plans You are being scheduled for surgery- Our schedulers will call you.  You should hear from our office's scheduling department within 5 working days about the location, date, and time of surgery. We try to make accommodations for patient's preferences in scheduling surgery, but sometimes the OR schedule or the surgeon's schedule prevents [place] from making those accommodations.  If you have not heard from our office [phone]) in 5 working days, call the office and ask for your surgeon's nurse.  If you have other questions about your diagnosis, plan, or surgery, call the office and ask for your surgeon's nurse.  Written instructions provided Pt Education - [facility] Colectomy post-op instructions: discussed with patient and provided information. Pt Education - [place] (Sharunda Salmon) Pt Education - [facility] Pelvic Floor Exercises (Kegels) and Dysfunction HCI (Meagan Ancona)  [name], M.D., F.A.C.S. Gastrointestinal and Minimally Invasive Surgery [facility] [address] [place],  [address] [phone] Main / Paging

## 2016-10-23 NOTE — Discharge Instructions (Signed)
SURGERY: POST OP INSTRUCTIONS (Surgery for small bowel obstruction, colon resection, etc)   ######################################################################  EAT Gradually transition to a high fiber diet with a fiber supplement over the next few days after discharge  WALK Walk an hour a day.  Control your pain to do that.    CONTROL PAIN Control pain so that you can walk, sleep, tolerate sneezing/coughing, go up/down stairs.  HAVE A BOWEL MOVEMENT DAILY Keep your bowels regular to avoid problems.  OK to try a laxative to override constipation.  OK to use an antidairrheal to slow down diarrhea.  Call if not better after 2 tries  CALL IF YOU HAVE PROBLEMS/CONCERNS Call if you are still struggling despite following these instructions. Call if you have concerns not answered by these instructions  ######################################################################   DIET Follow a light diet the first few days at home.  Start with a bland diet such as soups, liquids, starchy foods, low fat foods, etc.  If you feel full, bloated, or constipated, stay on a ful liquid or pureed/blenderized diet for a few days until you feel better and no longer constipated. Be sure to drink plenty of fluids every day to avoid getting dehydrated (feeling dizzy, not urinating, etc.). Gradually add a fiber supplement to your diet over the next week.  Gradually get back to a regular solid diet.  Avoid fast food or heavy meals the first week as you are more likely to get nauseated. It is expected for your digestive tract to need a few months to get back to normal.  It is common for your bowel movements and stools to be irregular.  You will have occasional bloating and cramping that should eventually fade away.  Until you are eating solid food normally, off all pain medications, and back to regular activities; your bowels will not be normal. Focus on eating a low-fat, high fiber diet the rest of your life  (See Getting to Good Bowel Health, below).  CARE of your INCISION or WOUND It is good for closed incision and even open wounds to be washed every day.  Shower every day.  Short baths are fine.  Wash the incisions and wounds clean with soap & water.    If you have a closed incision(s), wash the incision with soap & water every day.  You may leave closed incisions open to air if it is dry.   You may cover the incision with clean gauze & replace it after your daily shower for comfort. If you have skin tapes (Steristrips) or skin glue (Dermabond) on your incision, leave them in place.  They will fall off on their own like a scab.  You may trim any edges that curl up with clean scissors.  If you have staples, set up an appointment for them to be removed in the office in 10 days after surgery.  If you have a drain, wash around the skin exit site with soap & water and place a new dressing of gauze or band aid around the skin every day.  Keep the drain site clean & dry.    If you have an open wound with packing, see wound care instructions.  In general, it is encouraged that you remove your dressing and packing, shower with soap & water, and replace your dressing once a day.  Pack the wound with clean gauze moistened with normal (0.9%) saline to keep the wound moist & uninfected.  Pressure on the dressing for 30 minutes will stop most wound   bleeding.  Eventually your body will heal & pull the open wound closed over the next few months.  Raw open wounds will occasionally bleed or secrete yellow drainage until it heals closed.  Drain sites will drain a little until the drain is removed.  Even closed incisions can have mild bleeding or drainage the first few days until the skin edges scab over & seal.   If you have an open wound with a wound vac, see wound vac care instructions.     ACTIVITIES as tolerated Start light daily activities --- self-care, walking, climbing stairs-- beginning the day after surgery.   Gradually increase activities as tolerated.  Control your pain to be active.  Stop when you are tired.  Ideally, walk several times a day, eventually an hour a day.   Most people are back to most day-to-day activities in a few weeks.  It takes 4-8 weeks to get back to unrestricted, intense activity. If you can walk 30 minutes without difficulty, it is safe to try more intense activity such as jogging, treadmill, bicycling, low-impact aerobics, swimming, etc. Save the most intensive and strenuous activity for last (Usually 4-8 weeks after surgery) such as sit-ups, heavy lifting, contact sports, etc.  Refrain from any intense heavy lifting or straining until you are off narcotics for pain control.  You will have off days, but things should improve week-by-week. DO NOT PUSH THROUGH PAIN.  Let pain be your guide: If it hurts to do something, don't do it.  Pain is your body warning you to avoid that activity for another week until the pain goes down. You may drive when you are no longer taking narcotic prescription pain medication, you can comfortably wear a seatbelt, and you can safely make sudden turns/stops to protect yourself without hesitating due to pain. You may have sexual intercourse when it is comfortable. If it hurts to do something, stop.  MEDICATIONS Take your usually prescribed home medications unless otherwise directed.   Blood thinners:  Usually you can restart any strong blood thinners after the second postoperative day.  It is OK to take aspirin right away.     If you are on strong blood thinners (warfarin/Coumadin, Plavix, Xerelto, Eliquis, Pradaxa, etc), discuss with your surgeon, medicine PCP, and/or cardiologist for instructions on when to restart the blood thinner & if blood monitoring is needed (PT/INR blood check, etc).     PAIN CONTROL Pain after surgery or related to activity is often due to strain/injury to muscle, tendon, nerves and/or incisions.  This pain is usually  short-term and will improve in a few months.  To help speed the process of healing and to get back to regular activity more quickly, DO THE FOLLOWING THINGS TOGETHER: 1. Increase activity gradually.  DO NOT PUSH THROUGH PAIN 2. Use Ice and/or Heat 3. Try Gentle Massage and/or Stretching 4. Take over the counter pain medication 5. Take Narcotic prescription pain medication for more severe pain  Good pain control = faster recovery.  It is better to take more medicine to be more active than to stay in bed all day to avoid medications. 1.  Increase activity gradually Avoid heavy lifting at first, then increase to lifting as tolerated over the next 6 weeks. Do not "push through" the pain.  Listen to your body and avoid positions and maneuvers than reproduce the pain.  Wait a few days before trying something more intense Walking an hour a day is encouraged to help your body recover faster   and more safely.  Start slowly and stop when getting sore.  If you can walk 30 minutes without stopping or pain, you can try more intense activity (running, jogging, aerobics, cycling, swimming, treadmill, sex, sports, weightlifting, etc.) Remember: If it hurts to do it, then don't do it! 2. Use Ice and/or Heat You will have swelling and bruising around the incisions.  This will take several weeks to resolve. Ice packs or heating pads (6-8 times a day, 30-60 minutes at a time) will help sooth soreness & bruising. Some people prefer to use ice alone, heat alone, or alternate between ice & heat.  Experiment and see what works best for you.  Consider trying ice for the first few days to help decrease swelling and bruising; then, switch to heat to help relax sore spots and speed recovery. Shower every day.  Short baths are fine.  It feels good!  Keep the incisions and wounds clean with soap & water.   3. Try Gentle Massage and/or Stretching Massage at the area of pain many times a day Stop if you feel pain - do not  overdo it 4. Take over the counter pain medication This helps the muscle and nerve tissues become less irritable and calm down faster Choose ONE of the following over-the-counter anti-inflammatory medications: Acetaminophen 500mg tabs (Tylenol) 1-2 pills with every meal and just before bedtime (avoid if you have liver problems or if you have acetaminophen in you narcotic prescription) Naproxen 220mg tabs (ex. Aleve, Naprosyn) 1-2 pills twice a day (avoid if you have kidney, stomach, IBD, or bleeding problems) Ibuprofen 200mg tabs (ex. Advil, Motrin) 3-4 pills with every meal and just before bedtime (avoid if you have kidney, stomach, IBD, or bleeding problems) Take with food/snack several times a day as directed for at least 2 weeks to help keep pain / soreness down & more manageable. 5. Take Narcotic prescription pain medication for more severe pain A prescription for strong pain control is often given to you upon discharge (for example: oxycodone/Percocet, hydrocodone/Norco/Vicodin, or tramadol/Ultram) Take your pain medication as prescribed. Be mindful that most narcotic prescriptions contain Tylenol (acetaminophen) as well - avoid taking too much Tylenol. If you are having problems/concerns with the prescription medicine (does not control pain, nausea, vomiting, rash, itching, etc.), please call us [phone] to see if we need to switch you to a different pain medicine that will work better for you and/or control your side effects better. If you need a refill on your pain medication, you must call the office before 4 pm and on weekdays only.  By federal law, prescriptions for narcotics cannot be called into a pharmacy.  They must be filled out on paper & picked up from our office by the patient or authorized caretaker.  Prescriptions cannot be filled after 4 pm nor on weekends.    WHEN TO CALL US [phone] Severe uncontrolled or worsening pain  Fever over 101 F (38.5 C) Concerns with  the incision: Worsening pain, redness, rash/hives, swelling, bleeding, or drainage Reactions / problems with new medications (itching, rash, hives, nausea, etc.) Nausea and/or vomiting Difficulty urinating Difficulty breathing Worsening fatigue, dizziness, lightheadedness, blurred vision Other concerns If you are not getting better after two weeks or are noticing you are getting worse, contact our office [phone] for further advice.  We may need to adjust your medications, re-evaluate you in the office, send you to the emergency room, or see what other things we can do to help. The   clinic staff is available to answer your questions during regular business hours (8:30am-5pm).  Please don't hesitate to call and ask to speak to one of our nurses for clinical concerns.    A surgeon from [facility] is always on call at the hospitals 24 hours/day If you have a medical emergency, go to the nearest emergency room or call 911.  FOLLOW UP in our office One the day of your discharge from the hospital (or the next business weekday), please call [facility] to set up or confirm an appointment to see your surgeon in the office for a follow-up appointment.  Usually it is 2-3 weeks after your surgery.   If you have skin staples at your incision(s), let the office know so we can set up a time in the office for the nurse to remove them (usually around 10 days after surgery). Make sure that you call for appointments the day of discharge (or the next business weekday) from the hospital to ensure a convenient appointment time. IF YOU HAVE DISABILITY OR FAMILY LEAVE FORMS, BRING THEM TO THE OFFICE FOR PROCESSING.  DO NOT GIVE THEM TO YOUR DOCTOR.  [facility] [address], [address], [place], [place]  [address] ? [phone] - Main [phone] - Toll Free,  [phone] - Fax [email or web address]  GETTING TO GOOD BOWEL HEALTH. It is  expected for your digestive tract to need a few months to get back to normal.  It is common for your bowel movements and stools to be irregular.  You will have occasional bloating and cramping that should eventually fade away.  Until you are eating solid food normally, off all pain medications, and back to regular activities; your bowels will not be normal.   Avoiding constipation The goal: ONE SOFT BOWEL MOVEMENT A DAY!    Drink plenty of fluids.  Choose water first. TAKE A FIBER SUPPLEMENT EVERY DAY THE REST OF YOUR LIFE During your first week back home, gradually add back a fiber supplement every day Experiment which form you can tolerate.   There are many forms such as powders, tablets, wafers, gummies, etc Psyllium bran (Metamucil), methylcellulose (Citrucel), Miralax or Glycolax, Benefiber, Flax Seed.  Adjust the dose week-by-week (1/2 dose/day to 6 doses a day) until you are moving your bowels 1-2 times a day.  Cut back the dose or try a different fiber product if it is giving you problems such as diarrhea or bloating. Sometimes a laxative is needed to help jump-start bowels if constipated until the fiber supplement can help regulate your bowels.  If you are tolerating eating & you are farting, it is okay to try a gentle laxative such as double dose MiraLax, prune juice, or Milk of Magnesia.  Avoid using laxatives too often. Stool softeners can sometimes help counteract the constipating effects of narcotic pain medicines.  It can also cause diarrhea, so avoid using for too long. If you are still constipated despite taking fiber daily, eating solids, and a few doses of laxatives, call our office. Controlling diarrhea Try drinking liquids and eating bland foods for a few days to avoid stressing your intestines further. Avoid dairy products (especially milk & ice cream) for a short time.  The intestines often can lose the ability to digest lactose when stressed. Avoid foods that cause gassiness or  bloating.  Typical foods include beans and other legumes, cabbage, broccoli, and dairy foods.  Avoid greasy, spicy, fast foods.  Every person has   some sensitivity to other foods, so listen to your body and avoid those foods that trigger problems for you. Probiotics (such as active yogurt, Align, etc) may help repopulate the intestines and colon with normal bacteria and calm down a sensitive digestive tract Adding a fiber supplement gradually can help thicken stools by absorbing excess fluid and retrain the intestines to act more normally.  Slowly increase the dose over a few weeks.  Too much fiber too soon can backfire and cause cramping & bloating. It is okay to try and slow down diarrhea with a few doses of antidiarrheal medicines.   Bismuth subsalicylate (ex. Kayopectate, Pepto Bismol) for a few doses can help control diarrhea.  Avoid if pregnant.   Loperamide (Imodium) can slow down diarrhea.  Start with one tablet (2mg) first.  Avoid if you are having fevers or severe pain.  ILEOSTOMY PATIENTS WILL HAVE CHRONIC DIARRHEA since their colon is not in use.    Drink plenty of liquids.  You will need to drink even more glasses of water/liquid a day to avoid getting dehydrated. Record output from your ileostomy.  Expect to empty the bag every 3-4 hours at first.  Most people with a permanent ileostomy empty their bag 4-6 times at the least.   Use antidiarrheal medicine (especially Imodium) several times a day to avoid getting dehydrated.  Start with a dose at bedtime & breakfast.  Adjust up or down as needed.  Increase antidiarrheal medications as directed to avoid emptying the bag more than 8 times a day (every 3 hours). Work with your wound ostomy nurse to learn care for your ostomy.  See ostomy care instructions. TROUBLESHOOTING IRREGULAR BOWELS 1) Start with a soft & bland diet. No spicy, greasy, or fried foods.  2) Avoid gluten/wheat or dairy products from diet to see if symptoms improve. 3) Miralax  17gm or flax seed mixed in 8oz. water or juice-daily. May use 2-4 times a day as needed. 4) Gas-X, Phazyme, etc. as needed for gas & bloating.  5) Prilosec (omeprazole) over-the-counter as needed 6)  Consider probiotics (Align, Activa, etc) to help calm the bowels down  Call your doctor if you are getting worse or not getting better.  Sometimes further testing (cultures, endoscopy, X-ray studies, CT scans, bloodwork, etc.) may be needed to help diagnose and treat the cause of the diarrhea. [facility] [address], [address], [place], [place]  [address] [phone] - Main.    [phone]  - Toll Free.   [phone] - Fax [email or web address]   Pelvic floor muscle training exercises ("Kegels") can help strengthen the muscles under the uterus, bladder, and bowel (large intestine). They can help both men and women who have problems with urine leakage or bowel control.  A pelvic floor muscle training exercise is like pretending that you have to urinate, and then holding it. You relax and tighten the muscles that control urine flow. It's important to find the right muscles to tighten.  The next time you have to urinate, start to go and then stop. Feel the muscles in your vagina, bladder, or anus get tight and move up. These are the pelvic floor muscles. If you feel them tighten, you've done the exercise right. If you are still not sure whether you are tightening the right muscles, keep in mind that all of the muscles of the pelvic floor relax and contract at the same time. Because these muscles control the bladder, rectum, and vagina, the following tips   may help: Women: Insert a finger into your vagina. Tighten the muscles as if you are holding in your urine, then let go. You should feel the muscles tighten and move up and down.  Men: Insert a finger into your rectum. Tighten the muscles as if you are holding in your urine, then let go. You should feel the  muscles tighten and move up and down. These are the same muscles you would tighten if you were trying to prevent yourself from passing gas.  It is very important that you keep the following muscles relaxed while doing pelvic floor muscle training exercises: Abdominal  Buttocks (the deeper, anal sphincter muscle should contract)  Thigh   A woman can also strengthen these muscles by using a vaginal cone, which is a weighted device that is inserted into the vagina. Then you try to tighten the pelvic floor muscles to hold the device in place. If you are unsure whether you are doing the pelvic floor muscle training correctly, you can use biofeedback and electrical stimulation to help find the correct muscle group to work. Biofeedback is a method of positive reinforcement. Electrodes are placed on the abdomen and along the anal area. Some therapists place a sensor in the vagina in women or anus in men to monitor the contraction of pelvic floor muscles.  A monitor will display a graph showing which muscles are contracting and which are at rest. The therapist can help find the right muscles for performing pelvic floor muscle training exercises.   PERFORMING PELVIC FLOOR EXERCISES: 1. Begin by emptying your bladder. 2. Tighten the pelvic floor muscles and hold for a count of 10. 3. Relax the muscles completely for a count of 10. 4. Do 10 repititions, 3 to 5 times a day (morning, afternoon, and night). You can do these exercises at any time and any place. Most people prefer to do the exercises while lying down or sitting in a chair. After 4 - 6 weeks, most people notice some improvement. It may take as long as 3 months to see a major change. After a couple of weeks, you can also try doing a single pelvic floor contraction at times when you are likely to leak (for example, while getting out of a chair). A word of caution: Some people feel that they can speed up the progress by increasing the number of  repetitions and the frequency of exercises. However, over-exercising can instead cause muscle fatigue and increase urine leakage. If you feel any discomfort in your abdomen or back while doing these exercises, you are probably doing them wrong. Breathe deeply and relax your body when you are doing these exercises. Make sure you are not tightening your stomach, thigh, buttock, or chest muscles. When done the right way, pelvic floor muscle exercises have been shown to be very effective at improving urinary continence.  Pelvic Floor Pain / Incontinence  Do you suffer from pelvic pain or incontinence? Do you have pain in the pelvis, low back or hips that is associated with sitting, walking, urination or intercourse? Have you experienced leaking of urine or feces when coughing, sneezing or laughing? Do you have pain in the pelvic area associated with cancer?  These are conditions that are common with pelvic floor muscle dysfunction. Over time, due to stress, scar tissue, surgeries and the natural course of aging, our muscles may become weak or overstressed and can spasm. This can lead to pain, weakness, incontinence or decreased quality of life.  Men and   women with pelvic floor dysfunction frequently describe:  A "falling out" feeling. Pain or burning in the abdomen, tailbone or perineal area. Constipation or bowel elimination problems or difficulty initiating urination. Unresolved low back or hip pain. Frequency and urgency when going to the bathroom. Leaking of urine or feces. Pain with intercourse.  [email or web address]  To make a referral or for more information about [facility], call  [place] ([place]) - [phone] [place] ([place]) - [phone] [place] ([facility]) - [phone]  
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
## 2016-10-23 NOTE — Anesthesia Preprocedure Evaluation (Addendum)
Anesthesia Evaluation  Patient identified by MRN, date of birth, ID band Patient awake    Reviewed: Allergy & Precautions, NPO status , Patient's Chart, lab work & pertinent test results  Airway Mallampati: II  TM Distance: >3 FB Neck ROM: Full    Dental no notable dental hx. (+) Teeth Intact, Dental Advisory Given   Pulmonary neg pulmonary ROS,    Pulmonary exam normal breath sounds clear to auscultation       Cardiovascular negative cardio ROS Normal cardiovascular exam Rhythm:Regular Rate:Normal     Neuro/Psych negative neurological ROS  negative psych ROS   GI/Hepatic Neg liver ROS, Hx/o rectal Ca S/P loop ileostomy   Endo/Other  Obesity  Renal/GU negative Renal ROS  negative genitourinary   Musculoskeletal negative musculoskeletal ROS (+)   Abdominal (+) + obese,   Peds  Hematology   Anesthesia Other Findings   Reproductive/Obstetrics                           Anesthesia Physical Anesthesia Plan  ASA: II  Anesthesia Plan: General   Post-op Pain Management:    Induction: Intravenous  Airway Management Planned: Oral ETT  Additional Equipment:   Intra-op Plan:   Post-operative Plan: Extubation in OR  Informed Consent: I have reviewed the patients History and Physical, chart, labs and discussed the procedure including the risks, benefits and alternatives for the proposed anesthesia with the patient or authorized representative who has indicated his/her understanding and acceptance.   Dental advisory given  Plan Discussed with: CRNA, Anesthesiologist and Surgeon  Anesthesia Plan Comments: (Will use TIVA technique. Remifentanil + Propofol infusion. Patient reports hair loss after last GA.)       Anesthesia Quick Evaluation

## 2016-10-24 ENCOUNTER — Encounter (HOSPITAL_COMMUNITY): Payer: Self-pay | Admitting: Surgery

## 2016-10-24 LAB — BASIC METABOLIC PANEL
Anion gap: 9 (ref 5–15)
BUN: 19 mg/dL (ref 6–20)
CHLORIDE: 108 mmol/L (ref 101–111)
CO2: 25 mmol/L (ref 22–32)
CREATININE: 0.74 mg/dL (ref 0.44–1.00)
Calcium: 8.6 mg/dL — ABNORMAL LOW (ref 8.9–10.3)
GFR calc Af Amer: >60 mL/min (ref >60)
GFR calc non Af Amer: >60 mL/min (ref >60)
Glucose, Bld: 127 mg/dL — ABNORMAL HIGH (ref 65–99)
POTASSIUM: 3.8 mmol/L (ref 3.5–5.1)
SODIUM: 142 mmol/L (ref 135–145)

## 2016-10-24 LAB — CBC
HEMATOCRIT: 33.1 % — AB (ref 36.0–46.0)
Hemoglobin: 10.3 g/dL — ABNORMAL LOW (ref 12.0–15.0)
MCH: 25.8 pg — AB (ref 26.0–34.0)
MCHC: 31.1 g/dL (ref 30.0–36.0)
MCV: 82.8 fL (ref 78.0–100.0)
Platelets: 213 10*3/uL (ref 150–400)
RBC: 4 MIL/uL (ref 3.87–5.11)
RDW: 15.7 % — AB (ref 11.5–15.5)
WBC: 13 10*3/uL — AB (ref 4.0–10.5)

## 2016-10-24 LAB — MAGNESIUM: Magnesium: 1.9 mg/dL (ref 1.7–2.4)

## 2016-10-24 NOTE — Progress Notes (Addendum)
CCS/[name] Progress Note 1 Day Post-Op  Subjective: Patient doing fine.  Has passed some gas.  No bowel movement yet  Objective: Vital signs in last 24 hours: Temp:  [97.5 F (36.4 C)-98 F (36.7 C)] 97.9 F (36.6 C) (02/24 0934) Pulse Rate:  [51-70] 70 (02/24 0934) Resp:  [10-20] 18 (02/24 0934) BP: (109-153)/(59-88) 109/59 (02/24 0934) SpO2:  [95 %-100 %] 98 % (02/24 0934) Weight:  [85.7 kg (189 lb)-90.6 kg (199 lb 12.8 oz)] 90.6 kg (199 lb 12.8 oz) (02/24 0127) Last BM Date:  (PTA; pt. had ileostomy)  Intake/Output from previous day: 02/23 0701 - 02/24 0700 In: 2270 [P.O.:120; I.V.:2150] Out: 1375 [Urine:1245; Emesis/NG output:100; Blood:30] Intake/Output this shift: Total I/O In: 120 [P.O.:120] Out: 100 [Urine:100]  General: No distress  Lungs: Clear  Abd: Hypoactive bowel sounds.  Not tender.  Incision is covered and clean and dry.  Extremities:  No clinical signs or symptoms of DVT  Neuro: Intact  Lab Results:  @LABLAST2 (wbc:2,hgb:2,hct:2,plt:2) BMET ) Recent Labs  10/24/16 0417  NA 142  K 3.8  CL 108  CO2 25  GLUCOSE 127*  BUN 19  CREATININE 0.74  CALCIUM 8.6*   PT/INR No results for input(s): LABPROT, INR in the last 72 hours. ABG No results for input(s): PHART, HCO3 in the last 72 hours.  Invalid input(s): PCO2, PO2  Studies/Results: No results found.  Anti-infectives: Anti-infectives    Start     Dose/Rate Route Frequency Ordered Stop   10/24/16 0200  cefoTEtan (CEFOTAN) 2 g in dextrose 5 % 50 mL IVPB     2 g 100 mL/hr over 30 Minutes Intravenous Every 12 hours 10/23/16 1746 10/24/16 0305   10/23/16 1230  cefoTEtan in Dextrose 5% (CEFOTAN) IVPB 2 g     2 g Intravenous To [facility] 10/22/16 1001 10/23/16 1415   10/23/16 1230  clindamycin (CLEOCIN) 900 mg, gentamicin (GARAMYCIN) 240 mg in sodium chloride 0.9 % 1,000 mL for intraperitoneal lavage  Status:  Discontinued    Comments:  Have in the  OR room for final irrigation in  bowel surgery case to minimize risk of abscess/infection Pharmacy may adjust dosing strength, schedule, rate of infusion, etc as needed to optimize therapy    Intraperitoneal To Surgery 10/22/16 1001 10/23/16 1730      Assessment/Plan: s/p Procedure(s): LOOP ILEOSTOMY TAKEDOWN Patient on full liquids May advance to soft diet tomorrow.  LOS: 1 day   Kathryne Eriksson. Dahlia Bailiff, MD, FACS [phone] [facility] 10/24/2016

## 2016-10-24 NOTE — Plan of Care (Signed)
Problem: Education: Goal: Knowledge of [facility] Education information/materials will improve Outcome: Progressing POC reviewed with pt.   

## 2016-10-25 ENCOUNTER — Encounter (HOSPITAL_COMMUNITY): Payer: Self-pay

## 2016-10-25 ENCOUNTER — Emergency Department (HOSPITAL_COMMUNITY): Payer: Managed Care, Other (non HMO)

## 2016-10-25 ENCOUNTER — Inpatient Hospital Stay (HOSPITAL_COMMUNITY)
Admission: EM | Admit: 2016-10-25 | Discharge: 2016-10-28 | DRG: 372 | Disposition: A | Discharge status: Home / Self Care | Payer: Managed Care, Other (non HMO) | Attending: Surgery | Admitting: Surgery

## 2016-10-25 DIAGNOSIS — R197 Diarrhea, unspecified: Secondary | ICD-10-CM

## 2016-10-25 DIAGNOSIS — Z9889 Other specified postprocedural states: Secondary | ICD-10-CM

## 2016-10-25 DIAGNOSIS — E876 Hypokalemia: Secondary | ICD-10-CM | POA: Diagnosis present

## 2016-10-25 DIAGNOSIS — Z932 Ileostomy status: Secondary | ICD-10-CM

## 2016-10-25 DIAGNOSIS — C20 Malignant neoplasm of rectum: Secondary | ICD-10-CM | POA: Diagnosis present

## 2016-10-25 DIAGNOSIS — A0472 Enterocolitis due to Clostridium difficile, not specified as recurrent: Secondary | ICD-10-CM | POA: Diagnosis not present

## 2016-10-25 LAB — CBC WITH DIFFERENTIAL/PLATELET
Basophils Absolute: 0 10*3/uL (ref 0.0–0.1)
Basophils Relative: 0 %
Eosinophils Absolute: 0.1 10*3/uL (ref 0.0–0.7)
Eosinophils Relative: 1 %
HEMATOCRIT: 32.1 % — AB (ref 36.0–46.0)
Hemoglobin: 10.4 g/dL — ABNORMAL LOW (ref 12.0–15.0)
LYMPHS ABS: 1.2 10*3/uL (ref 0.7–4.0)
LYMPHS PCT: 9 %
MCH: 26.6 pg (ref 26.0–34.0)
MCHC: 32.4 g/dL (ref 30.0–36.0)
MCV: 82.1 fL (ref 78.0–100.0)
Monocytes Absolute: 1.7 10*3/uL — ABNORMAL HIGH (ref 0.1–1.0)
Monocytes Relative: 12 %
NEUTROS PCT: 78 %
Neutro Abs: 11 10*3/uL — ABNORMAL HIGH (ref 1.7–7.7)
Platelets: 239 10*3/uL (ref 150–400)
RBC: 3.91 MIL/uL (ref 3.87–5.11)
RDW: 16.4 % — ABNORMAL HIGH (ref 11.5–15.5)
WBC: 14 10*3/uL — AB (ref 4.0–10.5)

## 2016-10-25 LAB — URINALYSIS, ROUTINE W REFLEX MICROSCOPIC
BACTERIA UA: NONE SEEN
Bilirubin Urine: NEGATIVE
GLUCOSE, UA: NEGATIVE mg/dL
KETONES UR: 20 mg/dL — AB
Leukocytes, UA: NEGATIVE
Nitrite: NEGATIVE
PROTEIN: 30 mg/dL — AB
Specific Gravity, Urine: 1.017 (ref 1.005–1.030)
pH: 5 (ref 5.0–8.0)

## 2016-10-25 MED ORDER — SODIUM CHLORIDE 0.9 % IV BOLUS (SEPSIS)
1000.0000 mL | Freq: Once | INTRAVENOUS | Status: AC
  Administered 2016-10-25: 1000 mL via INTRAVENOUS

## 2016-10-25 MED ORDER — OXYCODONE HCL 5 MG PO TABS: 5.0000 mg | ORAL_TABLET | ORAL | 0 refills | Status: DC | PRN

## 2016-10-25 NOTE — ED Notes (Signed)
ED Provider at bedside. 

## 2016-10-25 NOTE — Progress Notes (Signed)
Pt ready to go home.  DC instructions given and reviewed.  Follow up appts. Reviewed.

## 2016-10-25 NOTE — ED Triage Notes (Signed)
Pt complaining of 102F fever and rapid heart rate. Pt states had bowel resection and reversal of ostomy on Friday. Pt denies any worsening abdominal pain. Pt denies any cough or SOB. Pt a/o x 4 at triage. Pt complaining of diarrhea.
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
## 2016-10-25 NOTE — Progress Notes (Signed)
Pt on soft diet, taking POs, voiding, having BMs and ambulating in the hallway.  Had a nagging HA but with tylenol, oxycodone, peppermint and coffee, it is much better now.

## 2016-10-25 NOTE — Discharge Summary (Signed)
Physician Discharge Summary  Patient ID: [name] MRN: [id number] DOB/AGE: 1956-03-12 61 y.o.  Admit date: 10/23/2016 Discharge date: 10/25/2016  Admission Diagnoses:  Discharge Diagnoses:  Principal Problem:   Rectal cancer h/o resection/diversion s/p ileostomy takedown 10/23/2016 Active Problems:   Rectal cancer s/p redo robotic LAR with coloanal anastomosis 07/01/2016   Obesity (BMI 30-39.9)   Discharged Condition: good  Hospital Course: Admitted after elective loop ileostomy reversal.  Did extremely well and having bowel movements.  Consults: None  Significant Diagnostic Studies: None  Treatments: IV hydration, antibiotics: perioperative antibiotics and analgesia: fentanyl and oxycodone  Discharge Exam: Blood pressure 133/68, pulse 82, temperature 98.6 F (37 C), temperature source Oral, resp. rate 18, height 5\' 3"  (1.6 m), weight 87.4 kg (192 lb 11.2 oz), SpO2 95 %. General appearance: alert, cooperative, appears stated age and no distress GI: soft, non-tender; bowel sounds normal; no masses,  no organomegaly and Has had several bowel movements  Disposition: 06-Home-Health Care Svc  Discharge Instructions    Call MD for:    Complete by:  As directed    FEVER > 101.5 F  (temperatures < 101.5 F are not significant)   Call MD for:  difficulty breathing, headache or visual disturbances    Complete by:  As directed    Call MD for:  extreme fatigue    Complete by:  As directed    Call MD for:  extreme fatigue    Complete by:  As directed    Call MD for:  hives    Complete by:  As directed    Call MD for:  persistant dizziness or light-headedness    Complete by:  As directed    Call MD for:  persistant dizziness or light-headedness    Complete by:  As directed    Call MD for:  persistant nausea and vomiting    Complete by:  As directed    Call MD for:  persistant nausea and vomiting    Complete by:  As directed    Call MD for:  redness, tenderness, or signs  of infection (pain, swelling, redness, odor or green/yellow discharge around incision site)    Complete by:  As directed    Call MD for:  redness, tenderness, or signs of infection (pain, swelling, redness, odor or green/yellow discharge around incision site)    Complete by:  As directed    Call MD for:  severe uncontrolled pain    Complete by:  As directed    Call MD for:  severe uncontrolled pain    Complete by:  As directed    Call MD for:  temperature >100.4    Complete by:  As directed    Diet - low sodium heart healthy    Complete by:  As directed    Follow a light diet the first few days at home.   Start with a bland diet such as soups, liquids, starchy foods, low fat foods, etc.   If you feel full, bloated, or constipated, stay on a full liquid or pureed/blenderized diet for a few days until you feel better and no longer constipated. Be sure to drink plenty of fluids every day to avoid getting dehydrated (feeling dizzy, not urinating, etc.). Gradually add a fiber supplement to your diet   Diet - low sodium heart healthy    Complete by:  As directed    Discharge instructions    Complete by:  As directed    See Discharge Instructions If  you are not getting better after two weeks or are noticing you are getting worse, contact our office [phone] for further advice.  We may need to adjust your medications, re-evaluate you in the office, send you to the emergency room, or see what other things we can do to help. The clinic staff is available to answer your questions during regular business hours (8:30am-5pm).  Please don't hesitate to call and ask to speak to one of our nurses for clinical concerns.    A surgeon from [facility] is always on call at the hospitals 24 hours/day If you have a medical emergency, go to the nearest emergency room or call 911.   Driving Restrictions    Complete by:  As directed    You may drive when you are no longer taking narcotic  prescription pain medication, you can comfortably wear a seatbelt, and you can safely make sudden turns/stops to protect yourself without hesitating due to pain.   Increase activity slowly    Complete by:  As directed    Start light daily activities --- self-care, walking, climbing stairs- beginning the day after surgery.  Gradually increase activities as tolerated.  Control your pain to be active.  Stop when you are tired.  Ideally, walk several times a day, eventually an hour a day.   Most people are back to most day-to-day activities in a few weeks.  It takes 4-8 weeks to get back to unrestricted, intense activity. If you can walk 30 minutes without difficulty, it is safe to try more intense activity such as jogging, treadmill, bicycling, low-impact aerobics, swimming, etc. Save the most intensive and strenuous activity for last (Usually 4-8 weeks after surgery) such as sit-ups, heavy lifting, contact sports, etc.  Refrain from any intense heavy lifting or straining until you are off narcotics for pain control.  You will have off days, but things should improve week-by-week. DO NOT PUSH THROUGH PAIN.  Let pain be your guide: If it hurts to do something, don't do it.  Pain is your body warning you to avoid that activity for another week until the pain goes down.   Increase activity slowly    Complete by:  As directed    Lifting restrictions    Complete by:  As directed    If you can walk 30 minutes without difficulty, it is safe to try more intense activity such as jogging, treadmill, bicycling, low-impact aerobics, swimming, etc. Save the most intensive and strenuous activity for last (Usually 4-8 weeks after surgery) such as sit-ups, heavy lifting, contact sports, etc.  Refrain from any intense heavy lifting or straining until you are off narcotics for pain control.  You will have off days, but things should improve week-by-week. DO NOT PUSH THROUGH PAIN.  Let pain be your guide: If it hurts to do  something, don't do it.  Pain is your body warning you to avoid that activity for another week until the pain goes down.   May walk up steps    Complete by:  As directed    No dressing needed    Complete by:  As directed    Call office tomorrow to ask about dressing changes   No wound care    Complete by:  As directed    It is good for closed incision and even open wounds to be washed every day.  Shower every day.  Short baths are fine.  Wash the incisions and wounds clean with soap &  water.    If you have a closed incision(s), wash the incision with soap & water every day.  You may leave closed incisions open to air if it is dry.   You may cover the incision with clean gauze & replace it after your daily shower for comfort. If you have skin tapes (Steristrips) or skin glue (Dermabond) on your incision, leave them in place.  They will fall off on their own like a scab.  You may trim any edges that curl up with clean scissors.  If you have staples, set up an appointment for them to be removed in the office in 10 days after surgery.  If you have a drain, wash around the skin exit site with soap & water and place a new dressing of gauze or band aid around the skin every day.  Keep the drain site clean & dry.   Sexual Activity Restrictions    Complete by:  As directed    You may have sexual intercourse when it is comfortable. If it hurts to do something, stop.     Allergies as of 10/25/2016      Reactions   Nitrofurantoin Monohyd Macro Anaphylaxis   Throat swelling    Iodinated Diagnostic Agents Hives   Oral CT contrast-chalky on 06-01-16 Face turned red, hot, dizzy   Metronidazole Nausea Only      Medication List    STOP taking these medications   predniSONE 50 MG tablet Commonly known as:  DELTASONE     TAKE these medications   acetaminophen 500 MG tablet Commonly known as:  TYLENOL Take 500 mg by mouth every 6 (six) hours as needed for mild pain.   BENADRYL 25 MG tablet Generic  drug:  diphenhydrAMINE Take 2 tablets (50 mg total) by mouth as directed. Take Benadryl 50 mg po 1 hour prior to CT.   ibuprofen 200 MG tablet Commonly known as:  ADVIL,MOTRIN Take 400-600 mg by mouth every 8 (eight) hours as needed (for pain.).   multivitamin tablet Take 1 tablet by mouth 3 (three) times a week.   oxyCODONE 5 MG immediate release tablet Commonly known as:  Oxy IR/ROXICODONE Take 1-2 tablets (5-10 mg total) by mouth every 4 (four) hours as needed for moderate pain, severe pain or breakthrough pain. What changed:  Another medication with the same name was added. Make sure you understand how and when to take each.   oxyCODONE 5 MG immediate release tablet Commonly known as:  Oxy IR/ROXICODONE Take 1-2 tablets (5-10 mg total) by mouth every 4 (four) hours as needed for moderate pain, severe pain or breakthrough pain. What changed:  You were already taking a medication with the same name, and this prescription was added. Make sure you understand how and when to take each.      Follow-up Information    GROSS,STEVEN C., MD. Schedule an appointment as soon as possible for a visit in 2 week(s).   Specialty:  General Surgery Why:  To follow up after your operation, To follow up after your hospital stay Contact information: [place] [address] [phone]           Signed: Judeth Horn 10/25/2016, 12:38 PM

## 2016-10-26 DIAGNOSIS — A0472 Enterocolitis due to Clostridium difficile, not specified as recurrent: Secondary | ICD-10-CM | POA: Diagnosis present

## 2016-10-26 DIAGNOSIS — E876 Hypokalemia: Secondary | ICD-10-CM | POA: Diagnosis present

## 2016-10-26 DIAGNOSIS — R197 Diarrhea, unspecified: Secondary | ICD-10-CM | POA: Diagnosis present

## 2016-10-26 DIAGNOSIS — C20 Malignant neoplasm of rectum: Secondary | ICD-10-CM | POA: Diagnosis present

## 2016-10-26 DIAGNOSIS — Z932 Ileostomy status: Secondary | ICD-10-CM | POA: Diagnosis not present

## 2016-10-26 LAB — MAGNESIUM: Magnesium: 1.8 mg/dL (ref 1.7–2.4)

## 2016-10-26 LAB — CBC
HEMATOCRIT: 30.7 % — AB (ref 36.0–46.0)
Hemoglobin: 9.7 g/dL — ABNORMAL LOW (ref 12.0–15.0)
MCH: 25.9 pg — AB (ref 26.0–34.0)
MCHC: 31.6 g/dL (ref 30.0–36.0)
MCV: 81.9 fL (ref 78.0–100.0)
Platelets: 249 10*3/uL (ref 150–400)
RBC: 3.75 MIL/uL — AB (ref 3.87–5.11)
RDW: 16.2 % — ABNORMAL HIGH (ref 11.5–15.5)
WBC: 14.9 10*3/uL — ABNORMAL HIGH (ref 4.0–10.5)

## 2016-10-26 LAB — COMPREHENSIVE METABOLIC PANEL
ALK PHOS: 59 U/L (ref 38–126)
ALT: 20 U/L (ref 14–54)
AST: 20 U/L (ref 15–41)
Albumin: 3 g/dL — ABNORMAL LOW (ref 3.5–5.0)
Anion gap: 11 (ref 5–15)
BUN: 8 mg/dL (ref 6–20)
CALCIUM: 8.6 mg/dL — AB (ref 8.9–10.3)
CHLORIDE: 104 mmol/L (ref 101–111)
CO2: 24 mmol/L (ref 22–32)
Creatinine, Ser: 0.71 mg/dL (ref 0.44–1.00)
GFR calc Af Amer: >60 mL/min (ref >60)
GFR calc non Af Amer: >60 mL/min (ref >60)
Glucose, Bld: 139 mg/dL — ABNORMAL HIGH (ref 65–99)
Potassium: 2.9 mmol/L — ABNORMAL LOW (ref 3.5–5.1)
SODIUM: 139 mmol/L (ref 135–145)
Total Bilirubin: 1.1 mg/dL (ref 0.3–1.2)
Total Protein: 6.2 g/dL — ABNORMAL LOW (ref 6.5–8.1)

## 2016-10-26 LAB — BASIC METABOLIC PANEL
Anion gap: 7 (ref 5–15)
BUN: 6 mg/dL (ref 6–20)
CHLORIDE: 109 mmol/L (ref 101–111)
CO2: 23 mmol/L (ref 22–32)
CREATININE: 0.59 mg/dL (ref 0.44–1.00)
Calcium: 8.2 mg/dL — ABNORMAL LOW (ref 8.9–10.3)
GFR calc Af Amer: >60 mL/min (ref >60)
GFR calc non Af Amer: >60 mL/min (ref >60)
Glucose, Bld: 129 mg/dL — ABNORMAL HIGH (ref 65–99)
POTASSIUM: 3.3 mmol/L — AB (ref 3.5–5.1)
SODIUM: 139 mmol/L (ref 135–145)

## 2016-10-26 LAB — TROPONIN I: Troponin I: 0.24 ng/mL (ref <0.03)

## 2016-10-26 LAB — C DIFFICILE QUICK SCREEN W PCR REFLEX
C Diff antigen: POSITIVE — AB
C Diff toxin: NEGATIVE

## 2016-10-26 LAB — CLOSTRIDIUM DIFFICILE BY PCR: Toxigenic C. Difficile by PCR: POSITIVE — AB

## 2016-10-26 LAB — I-STAT CG4 LACTIC ACID, ED: Lactic Acid, Venous: 0.95 mmol/L (ref 0.5–1.9)

## 2016-10-26 MED ORDER — MAGIC MOUTHWASH
15.0000 mL | Freq: Four times a day (QID) | ORAL | Status: DC | PRN
Start: 2016-10-26 — End: 2016-10-28

## 2016-10-26 MED ORDER — ALUM & MAG HYDROXIDE-SIMETH 200-200-20 MG/5ML PO SUSP: 30.0000 mL | Freq: Four times a day (QID) | ORAL | Status: DC | PRN

## 2016-10-26 MED ORDER — KCL IN DEXTROSE-NACL 20-5-0.45 MEQ/L-%-% IV SOLN
INTRAVENOUS | Status: DC
  Administered 2016-10-26 – 2016-10-27 (×3): via INTRAVENOUS
  Filled 2016-10-26 (×3): qty 1000

## 2016-10-26 MED ORDER — IBUPROFEN 400 MG PO TABS: 400.0000 mg | ORAL_TABLET | Freq: Three times a day (TID) | ORAL | Status: DC | PRN

## 2016-10-26 MED ORDER — LACTATED RINGERS IV BOLUS (SEPSIS)
1000.0000 mL | Freq: Once | INTRAVENOUS | Status: AC
  Administered 2016-10-26: 1000 mL via INTRAVENOUS

## 2016-10-26 MED ORDER — LIP MEDEX EX OINT: 1.0000 "application " | TOPICAL_OINTMENT | Freq: Two times a day (BID) | CUTANEOUS | Status: DC

## 2016-10-26 MED ORDER — MENTHOL 3 MG MT LOZG
1.0000 | LOZENGE | OROMUCOSAL | Status: DC | PRN
Start: 2016-10-26 — End: 2016-10-28
  Filled 2016-10-26: qty 9

## 2016-10-26 MED ORDER — ENOXAPARIN SODIUM 40 MG/0.4ML ~~LOC~~ SOLN
40.0000 mg | SUBCUTANEOUS | Status: DC
  Administered 2016-10-26 – 2016-10-28 (×3): 40 mg via SUBCUTANEOUS
  Filled 2016-10-26 (×4): qty 0.4

## 2016-10-26 MED ORDER — VANCOMYCIN 50 MG/ML ORAL SOLUTION
125.0000 mg | Freq: Four times a day (QID) | ORAL | Status: DC
  Administered 2016-10-26 – 2016-10-28 (×10): 125 mg via ORAL
  Filled 2016-10-26 (×12): qty 2.5

## 2016-10-26 MED ORDER — BISMUTH SUBSALICYLATE 262 MG/15ML PO SUSP
30.0000 mL | Freq: Three times a day (TID) | ORAL | Status: DC | PRN
  Administered 2016-10-26: 30 mL via ORAL
  Filled 2016-10-26 (×2): qty 118

## 2016-10-26 MED ORDER — SODIUM CHLORIDE 0.9 % IV BOLUS (SEPSIS)
1000.0000 mL | Freq: Once | INTRAVENOUS | Status: AC
  Administered 2016-10-26: 1000 mL via INTRAVENOUS

## 2016-10-26 MED ORDER — WITCH HAZEL-GLYCERIN EX PADS: 1.0000 "application " | MEDICATED_PAD | CUTANEOUS | Status: DC | PRN

## 2016-10-26 MED ORDER — ACETAMINOPHEN 325 MG PO TABS
650.0000 mg | ORAL_TABLET | Freq: Four times a day (QID) | ORAL | Status: DC | PRN
  Administered 2016-10-26: 650 mg via ORAL
  Filled 2016-10-26: qty 2

## 2016-10-26 MED ORDER — ONDANSETRON 4 MG PO TBDP: 4.0000 mg | ORAL_TABLET | Freq: Four times a day (QID) | ORAL | Status: DC | PRN

## 2016-10-26 MED ORDER — PHENOL 1.4 % MT LIQD
2.0000 | OROMUCOSAL | Status: DC | PRN
Start: 2016-10-26 — End: 2016-10-28
  Filled 2016-10-26: qty 177

## 2016-10-26 MED ORDER — MORPHINE SULFATE (PF) 4 MG/ML IV SOLN: 2.0000 mg | INTRAVENOUS | Status: DC | PRN

## 2016-10-26 MED ORDER — HYDROCODONE-ACETAMINOPHEN 5-325 MG PO TABS
1.0000 | ORAL_TABLET | ORAL | Status: DC | PRN
  Administered 2016-10-26 (×2): 1 via ORAL
  Administered 2016-10-26: 2 via ORAL
  Administered 2016-10-27: 1 via ORAL
  Administered 2016-10-27 – 2016-10-28 (×5): 2 via ORAL
  Filled 2016-10-26 (×3): qty 2
  Filled 2016-10-26 (×3): qty 1
  Filled 2016-10-26 (×3): qty 2

## 2016-10-26 MED ORDER — PSYLLIUM 95 % PO PACK
1.0000 | PACK | Freq: Every day | ORAL | Status: DC
  Administered 2016-10-26 – 2016-10-27 (×2): 1 via ORAL
  Filled 2016-10-26 (×3): qty 1

## 2016-10-26 MED ORDER — SODIUM CHLORIDE 0.9 % IV BOLUS (SEPSIS)
500.0000 mL | Freq: Once | INTRAVENOUS | Status: AC
  Administered 2016-10-26: 500 mL via INTRAVENOUS

## 2016-10-26 MED ORDER — SACCHAROMYCES BOULARDII 250 MG PO CAPS
250.0000 mg | ORAL_CAPSULE | Freq: Two times a day (BID) | ORAL | Status: DC
  Administered 2016-10-26 – 2016-10-27 (×4): 250 mg via ORAL
  Filled 2016-10-26 (×4): qty 1

## 2016-10-26 MED ORDER — LACTATED RINGERS IV BOLUS (SEPSIS): 1000.0000 mL | Freq: Three times a day (TID) | INTRAVENOUS | Status: DC | PRN

## 2016-10-26 MED ORDER — ACETAMINOPHEN 650 MG RE SUPP: 650.0000 mg | Freq: Four times a day (QID) | RECTAL | Status: DC | PRN

## 2016-10-26 MED ORDER — BLISTEX MEDICATED EX OINT
TOPICAL_OINTMENT | Freq: Two times a day (BID) | CUTANEOUS | Status: DC
  Administered 2016-10-26 – 2016-10-28 (×4): via TOPICAL
  Filled 2016-10-26: qty 6.3

## 2016-10-26 MED ORDER — ONDANSETRON HCL 4 MG/2ML IJ SOLN
4.0000 mg | Freq: Four times a day (QID) | INTRAMUSCULAR | Status: DC | PRN
  Administered 2016-10-26 (×3): 4 mg via INTRAVENOUS
  Filled 2016-10-26 (×3): qty 2

## 2016-10-26 MED ORDER — POTASSIUM CHLORIDE CRYS ER 20 MEQ PO TBCR
40.0000 meq | EXTENDED_RELEASE_TABLET | Freq: Every day | ORAL | Status: DC
  Administered 2016-10-26: 40 meq via ORAL
  Filled 2016-10-26: qty 2

## 2016-10-26 MED ORDER — ADULT MULTIVITAMIN W/MINERALS CH
1.0000 | ORAL_TABLET | Freq: Every day | ORAL | Status: DC
  Administered 2016-10-26 – 2016-10-28 (×3): 1 via ORAL
  Filled 2016-10-26 (×2): qty 1

## 2016-10-26 MED ORDER — MAGNESIUM SULFATE 2 GM/50ML IV SOLN
2.0000 g | Freq: Once | INTRAVENOUS | Status: AC
Start: 2016-10-26 — End: 2016-10-26
  Administered 2016-10-26: 2 g via INTRAVENOUS
  Filled 2016-10-26: qty 50

## 2016-10-26 NOTE — Progress Notes (Signed)
[facility]  [address] [place], [place] [address] Phone: [phone] FAX: [place] [phone] 11/01/1955    Problem List:   Active Problems:   Diarrhea           Assessment  Postoperative diarrhea of uncertain etiology.  Plan:  -awaitng C. Difficile results.  IV fluids.  Gentle antidiarrheal regimen.  Hold off on stronger Tx. Treat for C. Difficile if doesn't resolve.  Advance diet. -VTE prophylaxis- SCDs, etc -mobilize as tolerated to help recovery  [name], M.D., F.A.C.S. Gastrointestinal and Minimally Invasive Surgery [facility] [address], [place], [place] [address] [phone] Main / Paging   10/26/2016  CARE TEAM:  PCP: [name], NP  Outpatient Care Team: Patient Care Team: [name], NP as PCP - General (Obstetrics and Gynecology) [name], MD as Consulting Physician (General Surgery) [name], MD as Consulting Physician (Gastroenterology) [name], MD as Consulting Physician (Gynecology)  Inpatient Treatment Team: Treatment Team: Attending Provider: [name], MD; Registered Nurse: Fransisco Hertz, RN; Technician: Paula Compton, NT; Registered Nurse: Rosalva Ferron, RN  Subjective:  Feeling tired.  Diarrhea slowed down.  Felt some chest pain prior to the ED which scared her.  No pain now.  Not short of breath.  Nurse at bedside.  Husband at bedside.  Objective:  Vital signs:  Vitals:   10/26/16 0300 10/26/16 0330 10/26/16 0400 10/26/16 0450  BP: 105/67 109/67 112/64 121/67  Pulse: 89 92 92 90  Resp: 22 22 25  (!) 22  Temp:    100.1 F (37.8 C)  TempSrc:    Oral  SpO2: 97% 97% 97% 97%  Weight:    86.6 kg (190 lb 14.4 oz)  Height:    5' 3"  (1.6 m)    Last BM Date: 10/26/16  Intake/Output   Yesterday:  02/25 0701 - 02/26 0700 In: 1326.3 [P.O.:120; I.V.:206.3; IV  Piggyback:1000] Out: -  This shift:  No intake/output data recorded.  Bowel function:  Flatus: YES  BM:  YES  Drain: (No drain)   Physical Exam:  General: Pt awake/alert/oriented x4 in No acute distress.  Tired, not toxic Eyes: PERRL, normal EOM.  Sclera clear.  No icterus Neuro: CN II-XII intact w/o focal sensory/motor deficits. Lymph: No head/neck/groin lymphadenopathy Psych:  No delerium/psychosis/paranoia HENT: Normocephalic, Mucus membranes moist.  No thrush Neck: Supple, No tracheal deviation Chest:  No chest wall pain w good excursion CV:  Pulses intact.  Regular rhythm MS: Normal AROM mjr joints.  No obvious deformity Abdomen: Soft.  Nondistended.  Mildly tender at incisions only.  No evidence of peritonitis.  No incarcerated hernias. Ext:  SCDs BLE.  No mjr edema.  No cyanosis Skin: No petechiae / purpura  Results:   Labs: Results for orders placed or performed during the hospital encounter of 10/25/16 (from the past 48 hour(s))  Comprehensive metabolic panel     Status: Abnormal   Collection Time: 10/25/16 11:39 PM  Result Value Ref Range   Sodium 139 135 - 145 mmol/L   Potassium 2.9 (L) 3.5 - 5.1 mmol/L    Comment: DELTA CHECK NOTED   Chloride 104 101 - 111 mmol/L   CO2 24 22 - 32 mmol/L   Glucose, Bld 139 (H) 65 - 99 mg/dL   BUN 8 6 - 20 mg/dL   Creatinine, Ser 0.71 0.44 - 1.00 mg/dL   Calcium 8.6 (L)  8.9 - 10.3 mg/dL   Total Protein 6.2 (L) 6.5 - 8.1 g/dL   Albumin 3.0 (L) 3.5 - 5.0 g/dL   AST 20 15 - 41 U/L   ALT 20 14 - 54 U/L   Alkaline Phosphatase 59 38 - 126 U/L   Total Bilirubin 1.1 0.3 - 1.2 mg/dL   GFR calc non Af Amer >60 >60 mL/min   GFR calc Af Amer >60 >60 mL/min    Comment: (NOTE) The eGFR has been calculated using the CKD EPI equation. This calculation has not been validated in all clinical situations. eGFR's persistently <60 mL/min signify possible Chronic Kidney Disease.    Anion gap 11 5 - 15  CBC with Differential/Platelet      Status: Abnormal   Collection Time: 10/25/16 11:39 PM  Result Value Ref Range   WBC 14.0 (H) 4.0 - 10.5 K/uL   RBC 3.91 3.87 - 5.11 MIL/uL   Hemoglobin 10.4 (L) 12.0 - 15.0 g/dL   HCT 32.1 (L) 36.0 - 46.0 %   MCV 82.1 78.0 - 100.0 fL   MCH 26.6 26.0 - 34.0 pg   MCHC 32.4 30.0 - 36.0 g/dL   RDW 16.4 (H) 11.5 - 15.5 %   Platelets 239 150 - 400 K/uL   Neutrophils Relative % 78 %   Neutro Abs 11.0 (H) 1.7 - 7.7 K/uL   Lymphocytes Relative 9 %   Lymphs Abs 1.2 0.7 - 4.0 K/uL   Monocytes Relative 12 %   Monocytes Absolute 1.7 (H) 0.1 - 1.0 K/uL   Eosinophils Relative 1 %   Eosinophils Absolute 0.1 0.0 - 0.7 K/uL   Basophils Relative 0 %   Basophils Absolute 0.0 0.0 - 0.1 K/uL  Urinalysis, Routine w reflex microscopic     Status: Abnormal   Collection Time: 10/25/16 11:41 PM  Result Value Ref Range   Color, Urine YELLOW YELLOW   APPearance CLEAR CLEAR   Specific Gravity, Urine 1.017 1.005 - 1.030   pH 5.0 5.0 - 8.0   Glucose, UA NEGATIVE NEGATIVE mg/dL   Hgb urine dipstick LARGE (A) NEGATIVE   Bilirubin Urine NEGATIVE NEGATIVE   Ketones, ur 20 (A) NEGATIVE mg/dL   Protein, ur 30 (A) NEGATIVE mg/dL   Nitrite NEGATIVE NEGATIVE   Leukocytes, UA NEGATIVE NEGATIVE   RBC / HPF 6-30 0 - 5 RBC/hpf   WBC, UA 6-30 0 - 5 WBC/hpf   Bacteria, UA NONE SEEN NONE SEEN   Squamous Epithelial / LPF 0-5 (A) NONE SEEN   Mucous PRESENT   I-Stat CG4 Lactic Acid, ED     Status: None   Collection Time: 10/26/16 12:01 AM  Result Value Ref Range   Lactic Acid, Venous 0.95 0.5 - 1.9 mmol/L  Troponin I     Status: Abnormal   Collection Time: 10/26/16  1:15 AM  Result Value Ref Range   Troponin I 0.24 (HH) <0.03 ng/mL    Comment: CRITICAL RESULT CALLED TO, READ BACK BY AND VERIFIED WITH: PRUETT,K RN 10/26/2016 0232 JORDANS   CBC     Status: Abnormal   Collection Time: 10/26/16  6:50 AM  Result Value Ref Range   WBC 14.9 (H) 4.0 - 10.5 K/uL   RBC 3.75 (L) 3.87 - 5.11 MIL/uL   Hemoglobin 9.7 (L)  12.0 - 15.0 g/dL   HCT 30.7 (L) 36.0 - 46.0 %   MCV 81.9 78.0 - 100.0 fL   MCH 25.9 (L) 26.0 - 34.0 pg   MCHC 31.6  30.0 - 36.0 g/dL   RDW 16.2 (H) 11.5 - 15.5 %   Platelets 249 150 - 400 K/uL  Basic metabolic panel     Status: Abnormal   Collection Time: 10/26/16  6:50 AM  Result Value Ref Range   Sodium 139 135 - 145 mmol/L   Potassium 3.3 (L) 3.5 - 5.1 mmol/L   Chloride 109 101 - 111 mmol/L   CO2 23 22 - 32 mmol/L   Glucose, Bld 129 (H) 65 - 99 mg/dL   BUN 6 6 - 20 mg/dL   Creatinine, Ser 0.59 0.44 - 1.00 mg/dL   Calcium 8.2 (L) 8.9 - 10.3 mg/dL   GFR calc non Af Amer >60 >60 mL/min   GFR calc Af Amer >60 >60 mL/min    Comment: (NOTE) The eGFR has been calculated using the CKD EPI equation. This calculation has not been validated in all clinical situations. eGFR's persistently <60 mL/min signify possible Chronic Kidney Disease.    Anion gap 7 5 - 15    Imaging / Studies: Dg Chest Port 1 View  Result Date: 10/25/2016 CLINICAL DATA:  Fever and tachycardia. Recent bowel resection and reversal of ostomy 2 days ago. EXAM: PORTABLE CHEST 1 VIEW COMPARISON:  09/05/2006 and chest CT 06/01/2016 FINDINGS: Lungs are adequately inflated without consolidation or effusion. Cardiomediastinal silhouette is within normal. There are mild degenerate changes of the spine. IMPRESSION: No active disease. Electronically Signed   By: Marin Olp M.D.   On: 10/25/2016 23:55    Medications / Allergies: per chart  Antibiotics: Anti-infectives    None        Note: Portions of this report may have been transcribed using voice recognition software. Every effort was made to ensure accuracy; however, inadvertent computerized transcription errors may be present.   Any transcriptional errors that result from this process are unintentional.     [name], M.D., F.A.C.S. Gastrointestinal and Minimally Invasive Surgery [facility] [address], [place], [place] [address] [phone] Main / Paging   10/26/2016

## 2016-10-26 NOTE — ED Provider Notes (Signed)
Pt with elevated troponin ekg unchanged   EKG Interpretation  Date/Time:  Monday October 26 2016 02:47:37 EST Ventricular Rate:  93 PR Interval:  158 QRS Duration: 105 QT Interval:  360 QTC Calculation: 448 R Axis:   36 Text Interpretation:  Sinus rhythm Low voltage, precordial leads Borderline repolarization abnormality No significant change since last tracing Confirmed by Christy Gentles  MD, Elenore Rota ([id number]) on 10/26/2016 2:59:53 AM      She had reported chest tightness with palpitations during earlier evaluation However, her underlying process appears to be the main issue (post op fever with significant diarrhea/dehydration)  I don't suspect true Non-stemi D/w dr Kieth Brightly.  Would advise continue original plan, IV fluids, check for cdif.  Cardiology consult can be deferred at this time    Ripley Fraise, MD 10/26/16 0301
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
## 2016-10-27 ENCOUNTER — Encounter (HOSPITAL_COMMUNITY): Payer: Self-pay | Admitting: *Deleted

## 2016-10-27 LAB — BASIC METABOLIC PANEL
Anion gap: 4 — ABNORMAL LOW (ref 5–15)
CO2: 27 mmol/L (ref 22–32)
CREATININE: 0.58 mg/dL (ref 0.44–1.00)
Calcium: 8.2 mg/dL — ABNORMAL LOW (ref 8.9–10.3)
Chloride: 109 mmol/L (ref 101–111)
GFR calc Af Amer: >60 mL/min (ref >60)
GFR calc non Af Amer: >60 mL/min (ref >60)
GLUCOSE: 105 mg/dL — AB (ref 65–99)
Potassium: 3.4 mmol/L — ABNORMAL LOW (ref 3.5–5.1)
Sodium: 140 mmol/L (ref 135–145)

## 2016-10-27 LAB — CBC
HCT: 26.6 % — ABNORMAL LOW (ref 36.0–46.0)
Hemoglobin: 8.3 g/dL — ABNORMAL LOW (ref 12.0–15.0)
MCH: 25.9 pg — ABNORMAL LOW (ref 26.0–34.0)
MCHC: 31.2 g/dL (ref 30.0–36.0)
MCV: 82.9 fL (ref 78.0–100.0)
PLATELETS: 208 10*3/uL (ref 150–400)
RBC: 3.21 MIL/uL — ABNORMAL LOW (ref 3.87–5.11)
RDW: 16.4 % — AB (ref 11.5–15.5)
WBC: 8.6 10*3/uL (ref 4.0–10.5)

## 2016-10-27 MED ORDER — POTASSIUM CHLORIDE CRYS ER 20 MEQ PO TBCR
40.0000 meq | EXTENDED_RELEASE_TABLET | Freq: Two times a day (BID) | ORAL | Status: DC
  Administered 2016-10-27 – 2016-10-28 (×3): 40 meq via ORAL
  Filled 2016-10-27 (×3): qty 2

## 2016-10-27 MED ORDER — BISMUTH SUBSALICYLATE 262 MG/15ML PO SUSP
30.0000 mL | Freq: Two times a day (BID) | ORAL | Status: AC
  Administered 2016-10-27 (×2): 30 mL via ORAL
  Filled 2016-10-27: qty 118

## 2016-10-27 MED ORDER — SODIUM CHLORIDE 0.9% FLUSH: 3.0000 mL | INTRAVENOUS | Status: DC | PRN

## 2016-10-27 MED ORDER — LOPERAMIDE HCL 2 MG PO CAPS
2.0000 mg | ORAL_CAPSULE | Freq: Three times a day (TID) | ORAL | Status: DC | PRN
  Administered 2016-10-27: 2 mg via ORAL
  Filled 2016-10-27: qty 1

## 2016-10-27 MED ORDER — SODIUM CHLORIDE 0.9% FLUSH
3.0000 mL | Freq: Two times a day (BID) | INTRAVENOUS | Status: DC
  Administered 2016-10-27 – 2016-10-28 (×2): 3 mL via INTRAVENOUS

## 2016-10-27 MED ORDER — SODIUM CHLORIDE 0.9 % IV SOLN: 250.0000 mL | INTRAVENOUS | Status: DC | PRN

## 2016-10-27 MED ORDER — LACTATED RINGERS IV BOLUS (SEPSIS): 1000.0000 mL | Freq: Three times a day (TID) | INTRAVENOUS | Status: DC | PRN

## 2016-10-27 NOTE — Progress Notes (Signed)
[facility]  [address] [place], [name] [address] Phone: [phone] FAX: [place] [phone] 06/05/1956    Problem List:   Active Problems:   Diarrhea           Assessment  Postoperative diarrhea of uncertain etiology, probable Cdiff  Plan:  -PO vanco x 14 d per protocol for C. Diff.  Results equivocal but patient with diarrhea -adv solid diet -Pack ostomy wound - had hematoma spontaneously drain -wean off IV fluids. -antidiarrheal regimen.  Hold off on stronger Tx.  -hypokalemia - replace -VTE prophylaxis- SCDs, etc -mobilize as tolerated to help recovery  [name], M.D., F.A.C.S. Gastrointestinal and Minimally Invasive Surgery [facility] [address], [place], [place] [address] [phone] Main / Paging   10/27/2016  CARE TEAM:  PCP: [name], NP  Outpatient Care Team: Patient Care Team: [name], NP as PCP - General (Obstetrics and Gynecology) [name], MD as Consulting Physician (General Surgery) [name], MD as Consulting Physician (Gastroenterology) [name], MD as Consulting Physician (Gynecology)  Inpatient Treatment Team: Treatment Team: Attending Provider: [name], MD; Registered Nurse: Fransisco Hertz, RN  Subjective:  Feeling less tired. Dumped bloody drainage after wicks removed yesteray AM - not much since Diarrhea slowing down. No nausea No chest pain Staying in room  Objective:  Vital signs:  Vitals:   10/26/16 0450 10/26/16 1532 10/26/16 2128 10/27/16 0444  BP: 121/67 (!) 119/54 (!) 107/58   Pulse: 90 84 81   Resp: (!) 22 19 18 17   Temp: 100.1 F (37.8 C) 99.8 F (37.7 C) 98.6 F (37 C) 98.8 F (37.1 C)  TempSrc: Oral Oral Oral Oral  SpO2: 97% 97% 98% 97%  Weight: 86.6 kg (190 lb 14.4 oz)     Height: 5' 3"  (1.6 m)       Last BM Date: 10/26/16  Intake/Output    Yesterday:  02/26 0701 - 02/27 0700 In: 2020 [P.O.:2020] Out: 0  This shift:  Total I/O In: 83 [P.O.:460] Out: -   Bowel function:  Flatus: YES  BM:  YES  Drain: (No drain)   Physical Exam:  General: Pt awake/alert/oriented x4 in No acute distress.  Less tired, not toxic Eyes: PERRL, normal EOM.  Sclera clear.  No icterus Neuro: CN II-XII intact w/o focal sensory/motor deficits. Lymph: No head/neck/groin lymphadenopathy Psych:  No delerium/psychosis/paranoia HENT: Normocephalic, Mucus membranes moist.  No thrush Neck: Supple, No tracheal deviation Chest:  No chest wall pain w good excursion CV:  Pulses intact.  Regular rhythm MS: Normal AROM mjr joints.  No obvious deformity Abdomen: Soft.  Nondistended.  Tenderness at old ostomy site minimal - improved.  No cellultitis.  Scant old serosanguinous drainage on dressing.  No evidence of peritonitis.  No incarcerated hernias. Ext:  SCDs BLE.  No mjr edema.  No cyanosis Skin: No petechiae / purpura  Results:   Labs: Results for orders placed or performed during the hospital encounter of 10/25/16 (from the past 48 hour(s))  Comprehensive metabolic panel     Status: Abnormal   Collection Time: 10/25/16 11:39 PM  Result Value Ref Range   Sodium 139 135 - 145 mmol/L   Potassium 2.9 (L) 3.5 - 5.1 mmol/L    Comment: DELTA CHECK NOTED   Chloride 104 101 - 111 mmol/L   CO2 24 22 - 32 mmol/L   Glucose, Bld 139 (H) 65 -  99 mg/dL   BUN 8 6 - 20 mg/dL   Creatinine, Ser 0.71 0.44 - 1.00 mg/dL   Calcium 8.6 (L) 8.9 - 10.3 mg/dL   Total Protein 6.2 (L) 6.5 - 8.1 g/dL   Albumin 3.0 (L) 3.5 - 5.0 g/dL   AST 20 15 - 41 U/L   ALT 20 14 - 54 U/L   Alkaline Phosphatase 59 38 - 126 U/L   Total Bilirubin 1.1 0.3 - 1.2 mg/dL   GFR calc non Af Amer >60 >60 mL/min   GFR calc Af Amer >60 >60 mL/min    Comment: (NOTE) The eGFR has been calculated using the CKD EPI equation. This calculation has not been validated in all clinical  situations. eGFR's persistently <60 mL/min signify possible Chronic Kidney Disease.    Anion gap 11 5 - 15  CBC with Differential/Platelet     Status: Abnormal   Collection Time: 10/25/16 11:39 PM  Result Value Ref Range   WBC 14.0 (H) 4.0 - 10.5 K/uL   RBC 3.91 3.87 - 5.11 MIL/uL   Hemoglobin 10.4 (L) 12.0 - 15.0 g/dL   HCT 32.1 (L) 36.0 - 46.0 %   MCV 82.1 78.0 - 100.0 fL   MCH 26.6 26.0 - 34.0 pg   MCHC 32.4 30.0 - 36.0 g/dL   RDW 16.4 (H) 11.5 - 15.5 %   Platelets 239 150 - 400 K/uL   Neutrophils Relative % 78 %   Neutro Abs 11.0 (H) 1.7 - 7.7 K/uL   Lymphocytes Relative 9 %   Lymphs Abs 1.2 0.7 - 4.0 K/uL   Monocytes Relative 12 %   Monocytes Absolute 1.7 (H) 0.1 - 1.0 K/uL   Eosinophils Relative 1 %   Eosinophils Absolute 0.1 0.0 - 0.7 K/uL   Basophils Relative 0 %   Basophils Absolute 0.0 0.0 - 0.1 K/uL  Urinalysis, Routine w reflex microscopic     Status: Abnormal   Collection Time: 10/25/16 11:41 PM  Result Value Ref Range   Color, Urine YELLOW YELLOW   APPearance CLEAR CLEAR   Specific Gravity, Urine 1.017 1.005 - 1.030   pH 5.0 5.0 - 8.0   Glucose, UA NEGATIVE NEGATIVE mg/dL   Hgb urine dipstick LARGE (A) NEGATIVE   Bilirubin Urine NEGATIVE NEGATIVE   Ketones, ur 20 (A) NEGATIVE mg/dL   Protein, ur 30 (A) NEGATIVE mg/dL   Nitrite NEGATIVE NEGATIVE   Leukocytes, UA NEGATIVE NEGATIVE   RBC / HPF 6-30 0 - 5 RBC/hpf   WBC, UA 6-30 0 - 5 WBC/hpf   Bacteria, UA NONE SEEN NONE SEEN   Squamous Epithelial / LPF 0-5 (A) NONE SEEN   Mucous PRESENT   I-Stat CG4 Lactic Acid, ED     Status: None   Collection Time: 10/26/16 12:01 AM  Result Value Ref Range   Lactic Acid, Venous 0.95 0.5 - 1.9 mmol/L  Troponin I     Status: Abnormal   Collection Time: 10/26/16  1:15 AM  Result Value Ref Range   Troponin I 0.24 (HH) <0.03 ng/mL    Comment: CRITICAL RESULT CALLED TO, READ BACK BY AND VERIFIED WITH: PRUETT,K RN 10/26/2016 0232 JORDANS   C difficile quick scan w PCR  reflex     Status: Abnormal   Collection Time: 10/26/16  4:13 AM  Result Value Ref Range   C Diff antigen POSITIVE (A) NEGATIVE   C Diff toxin NEGATIVE NEGATIVE   C Diff interpretation Results are indeterminate. See PCR results.  Clostridium Difficile by PCR     Status: Abnormal   Collection Time: 10/26/16  4:13 AM  Result Value Ref Range   Toxigenic C Difficile by pcr POSITIVE (A) NEGATIVE    Comment: Positive for toxigenic C. difficile with little to no toxin production. Only treat if clinical presentation suggests symptomatic illness.  CBC     Status: Abnormal   Collection Time: 10/26/16  6:50 AM  Result Value Ref Range   WBC 14.9 (H) 4.0 - 10.5 K/uL   RBC 3.75 (L) 3.87 - 5.11 MIL/uL   Hemoglobin 9.7 (L) 12.0 - 15.0 g/dL   HCT 30.7 (L) 36.0 - 46.0 %   MCV 81.9 78.0 - 100.0 fL   MCH 25.9 (L) 26.0 - 34.0 pg   MCHC 31.6 30.0 - 36.0 g/dL   RDW 16.2 (H) 11.5 - 15.5 %   Platelets 249 150 - 400 K/uL  Basic metabolic panel     Status: Abnormal   Collection Time: 10/26/16  6:50 AM  Result Value Ref Range   Sodium 139 135 - 145 mmol/L   Potassium 3.3 (L) 3.5 - 5.1 mmol/L   Chloride 109 101 - 111 mmol/L   CO2 23 22 - 32 mmol/L   Glucose, Bld 129 (H) 65 - 99 mg/dL   BUN 6 6 - 20 mg/dL   Creatinine, Ser 0.59 0.44 - 1.00 mg/dL   Calcium 8.2 (L) 8.9 - 10.3 mg/dL   GFR calc non Af Amer >60 >60 mL/min   GFR calc Af Amer >60 >60 mL/min    Comment: (NOTE) The eGFR has been calculated using the CKD EPI equation. This calculation has not been validated in all clinical situations. eGFR's persistently <60 mL/min signify possible Chronic Kidney Disease.    Anion gap 7 5 - 15  Troponin I (q 6hr x 3)     Status: None   Collection Time: 10/26/16  6:50 AM  Result Value Ref Range   Troponin I <0.03 <0.03 ng/mL  Magnesium     Status: None   Collection Time: 10/26/16  6:50 AM  Result Value Ref Range   Magnesium 1.8 1.7 - 2.4 mg/dL  Troponin I (q 6hr x 3)     Status: None   Collection Time:  10/26/16  1:59 PM  Result Value Ref Range   Troponin I <0.03 <0.03 ng/mL  Troponin I (q 6hr x 3)     Status: None   Collection Time: 10/26/16  7:13 PM  Result Value Ref Range   Troponin I <0.03 <0.03 ng/mL  Basic metabolic panel     Status: Abnormal   Collection Time: 10/27/16  4:31 AM  Result Value Ref Range   Sodium 140 135 - 145 mmol/L   Potassium 3.4 (L) 3.5 - 5.1 mmol/L   Chloride 109 101 - 111 mmol/L   CO2 27 22 - 32 mmol/L   Glucose, Bld 105 (H) 65 - 99 mg/dL   BUN <5 (L) 6 - 20 mg/dL   Creatinine, Ser 0.58 0.44 - 1.00 mg/dL   Calcium 8.2 (L) 8.9 - 10.3 mg/dL   GFR calc non Af Amer >60 >60 mL/min   GFR calc Af Amer >60 >60 mL/min    Comment: (NOTE) The eGFR has been calculated using the CKD EPI equation. This calculation has not been validated in all clinical situations. eGFR's persistently <60 mL/min signify possible Chronic Kidney Disease.    Anion gap 4 (L) 5 - 15  CBC  Status: Abnormal   Collection Time: 10/27/16  4:31 AM  Result Value Ref Range   WBC 8.6 4.0 - 10.5 K/uL   RBC 3.21 (L) 3.87 - 5.11 MIL/uL   Hemoglobin 8.3 (L) 12.0 - 15.0 g/dL   HCT 26.6 (L) 36.0 - 46.0 %   MCV 82.9 78.0 - 100.0 fL   MCH 25.9 (L) 26.0 - 34.0 pg   MCHC 31.2 30.0 - 36.0 g/dL   RDW 16.4 (H) 11.5 - 15.5 %   Platelets 208 150 - 400 K/uL    Imaging / Studies: Dg Chest Port 1 View  Result Date: 10/25/2016 CLINICAL DATA:  Fever and tachycardia. Recent bowel resection and reversal of ostomy 2 days ago. EXAM: PORTABLE CHEST 1 VIEW COMPARISON:  09/05/2006 and chest CT 06/01/2016 FINDINGS: Lungs are adequately inflated without consolidation or effusion. Cardiomediastinal silhouette is within normal. There are mild degenerate changes of the spine. IMPRESSION: No active disease. Electronically Signed   By: Marin Olp M.D.   On: 10/25/2016 23:55    Medications / Allergies: per chart  Antibiotics: Anti-infectives    Start     Dose/Rate Route Frequency Ordered Stop   10/26/16 1500   vancomycin (VANCOCIN) 50 mg/mL oral solution 125 mg     125 mg Oral 4 times daily 10/26/16 1412 11/09/16 1359        Note: Portions of this report may have been transcribed using voice recognition software. Every effort was made to ensure accuracy; however, inadvertent computerized transcription errors may be present.   Any transcriptional errors that result from this process are unintentional.     [name], M.D., F.A.C.S. Gastrointestinal and Minimally Invasive Surgery [facility] [address] [place],  [address] [phone] Main / Paging   10/27/2016

## 2016-10-28 LAB — CBC
HEMATOCRIT: 30 % — AB (ref 36.0–46.0)
Hemoglobin: 9.4 g/dL — ABNORMAL LOW (ref 12.0–15.0)
MCH: 26 pg (ref 26.0–34.0)
MCHC: 31.3 g/dL (ref 30.0–36.0)
MCV: 83.1 fL (ref 78.0–100.0)
Platelets: 312 10*3/uL (ref 150–400)
RBC: 3.61 MIL/uL — ABNORMAL LOW (ref 3.87–5.11)
RDW: 16.3 % — AB (ref 11.5–15.5)
WBC: 9.7 10*3/uL (ref 4.0–10.5)

## 2016-10-28 LAB — MAGNESIUM: Magnesium: 2.1 mg/dL (ref 1.7–2.4)

## 2016-10-28 LAB — POTASSIUM: POTASSIUM: 4 mmol/L (ref 3.5–5.1)

## 2016-10-28 MED ORDER — VANCOMYCIN 50 MG/ML ORAL SOLUTION: 125.0000 mg | Freq: Four times a day (QID) | ORAL | 1 refills | Status: AC

## 2016-10-28 MED ORDER — ACETAMINOPHEN 500 MG PO TABS: 1000.0000 mg | ORAL_TABLET | Freq: Four times a day (QID) | ORAL | Status: DC | PRN

## 2016-10-28 MED ORDER — PSYLLIUM 95 % PO PACK
1.0000 | PACK | Freq: Two times a day (BID) | ORAL | Status: DC
Start: 2016-10-28 — End: 2016-10-28
  Administered 2016-10-28: 1 via ORAL
  Filled 2016-10-28 (×2): qty 1

## 2016-10-28 MED ORDER — POLYETHYLENE GLYCOL 3350 17 G PO PACK: 17.0000 g | PACK | Freq: Two times a day (BID) | ORAL | Status: DC

## 2016-10-28 NOTE — Progress Notes (Addendum)
[address] [place], [place] [address] Phone: [phone] FAX: [place] [phone] 05-Nov-1955    Problem List:   Principal Problem:   C. difficile diarrhea Active Problems:   Hypokalemia   Rectal cancer h/o resection/diversion s/p ileostomy takedown 10/23/2016           Assessment  Postoperative diarrhea of uncertain etiology, probable Cdiff  Plan:  -PO vanco x 14 d per protocol for C. Diff.  Results equivocal but patient with diarrhea - suspect it is fecal urgency with neorectum having fair compliance - should adapt & improve -solid diet -Pack ostomy wound - had hematoma spontaneously drain - minimal now -wean off IV fluids. -antidiarrheal regimen.  Patient doesn't like metamucil.  I tried to switch to miralax, but then she changed her mind & told nurse she wished to switch back.  imodium x 1 & then PRN -hypokalemia - replace & follow -VTE prophylaxis- SCDs, etc -mobilize as tolerated to help recovery  [name], M.D., F.A.C.S. Gastrointestinal and Minimally Invasive Surgery [facility] [address], [place], [place] [address] [phone] Main / Paging   10/28/2016  CARE TEAM:  PCP: [name], NP  Outpatient Care Team: Patient Care Team: [name], NP as PCP - General (Obstetrics and Gynecology) [name], MD as Consulting Physician (General Surgery) [name], MD as Consulting Physician (Gastroenterology) [name], MD as Consulting Physician (Gynecology)  Inpatient Treatment Team: Treatment Team: Attending Provider: [name], MD; Registered Nurse: Fransisco Hertz, RN; Technician: Paula Compton, NT; Registered Nurse: Jimmie Molly, RN; Technician: Etheleen Sia, NT  Subjective:  Feeling better. Dumped bloody drainage after wicks removed yesteray AM - not much since Diarrhea  intermittent No nausea No chest pain   Objective:  Vital signs:  Vitals:   10/27/16 0444 10/27/16 1415 10/27/16 2101 10/28/16 0448  BP:  137/79 133/74 108/71  Pulse:  92 84 79  Resp: 17 18 16 16   Temp: 98.8 F (37.1 C) 98.2 F (36.8 C) 97.5 F (36.4 C) 97.9 F (36.6 C)  TempSrc: Oral Oral Oral Oral  SpO2: 97% 100% 100% 98%  Weight:      Height:        Last BM Date: 10/27/16  Intake/Output   Yesterday:  02/27 0701 - 02/28 0700 In: 1680 [P.O.:1680] Out: -  This shift:  Total I/O In: 240 [P.O.:240] Out: -   Bowel function:  Flatus: YES  BM:  YES  Drain: (No drain)   Physical Exam:  General: Pt awake/alert/oriented x4 in No acute distress.  Less tired, not toxic Eyes: PERRL, normal EOM.  Sclera clear.  No icterus Neuro: CN II-XII intact w/o focal sensory/motor deficits. Lymph: No head/neck/groin lymphadenopathy Psych:  No delerium/psychosis/paranoia HENT: Normocephalic, Mucus membranes moist.  No thrush Neck: Supple, No tracheal deviation Chest:  No chest wall pain w good excursion CV:  Pulses intact.  Regular rhythm MS: Normal AROM mjr joints.  No obvious deformity Abdomen: Soft.  Nondistended.  Tenderness at old ostomy site minimal - improved.  No cellultitis.  Scant old serosanguinous drainage on dressing.  No evidence of peritonitis.  No incarcerated hernias. Ext:  SCDs BLE.  No mjr edema.  No cyanosis Skin: No petechiae / purpura  Results:   Labs: Results for orders placed or performed during the hospital encounter of 10/25/16 (from the past 48 hour(s))  Troponin I (q 6hr  x 3)     Status: None   Collection Time: 10/26/16  1:59 PM  Result Value Ref Range   Troponin I <0.03 <0.03 ng/mL  Troponin I (q 6hr x 3)     Status: None   Collection Time: 10/26/16  7:13 PM  Result Value Ref Range   Troponin I <0.03 <0.03 ng/mL  Basic metabolic panel     Status: Abnormal   Collection Time: 10/27/16  4:31 AM  Result Value Ref Range   Sodium 140 135 - 145  mmol/L   Potassium 3.4 (L) 3.5 - 5.1 mmol/L   Chloride 109 101 - 111 mmol/L   CO2 27 22 - 32 mmol/L   Glucose, Bld 105 (H) 65 - 99 mg/dL   BUN <5 (L) 6 - 20 mg/dL   Creatinine, Ser 0.58 0.44 - 1.00 mg/dL   Calcium 8.2 (L) 8.9 - 10.3 mg/dL   GFR calc non Af Amer >60 >60 mL/min   GFR calc Af Amer >60 >60 mL/min    Comment: (NOTE) The eGFR has been calculated using the CKD EPI equation. This calculation has not been validated in all clinical situations. eGFR's persistently <60 mL/min signify possible Chronic Kidney Disease.    Anion gap 4 (L) 5 - 15  CBC     Status: Abnormal   Collection Time: 10/27/16  4:31 AM  Result Value Ref Range   WBC 8.6 4.0 - 10.5 K/uL   RBC 3.21 (L) 3.87 - 5.11 MIL/uL   Hemoglobin 8.3 (L) 12.0 - 15.0 g/dL   HCT 26.6 (L) 36.0 - 46.0 %   MCV 82.9 78.0 - 100.0 fL   MCH 25.9 (L) 26.0 - 34.0 pg   MCHC 31.2 30.0 - 36.0 g/dL   RDW 16.4 (H) 11.5 - 15.5 %   Platelets 208 150 - 400 K/uL    Imaging / Studies: No results found.  Medications / Allergies: per chart  Antibiotics: Anti-infectives    Start     Dose/Rate Route Frequency Ordered Stop   10/26/16 1500  vancomycin (VANCOCIN) 50 mg/mL oral solution 125 mg     125 mg Oral 4 times daily 10/26/16 1412 11/09/16 1359        Note: Portions of this report may have been transcribed using voice recognition software. Every effort was made to ensure accuracy; however, inadvertent computerized transcription errors may be present.   Any transcriptional errors that result from this process are unintentional.     [name], M.D., F.A.C.S. Gastrointestinal and Minimally Invasive Surgery [facility] [address] [place], [place] [address] [phone] Main / Paging   10/28/2016

## 2016-10-28 NOTE — Care Management Note (Signed)
Case Management Note  Patient Details  Name: [name] MRN: [id number] Date of Birth: [birth year]  Subjective/Objective:                 Spoke with patient and daughter at the bedside. Patient states that she changed her dressing this morning (not including packing). Discussed potential need for [facility] RN to assist with dressing changes, patient declined. Patient is independent from home with several family members close by to assist. Family will provide transport to follow ups. Patient denies difficulty obtaining meds.    Action/Plan:  No further CM needs identified.   Expected Discharge Date:  10/30/16               Expected Discharge Plan:  Home/Self Care  In-House Referral:     Discharge planning Services  CM Consult  Post Acute Care Choice:    Choice offered to:  Patient  DME Arranged:    DME Agency:     HH Arranged:    [place] Agency:     Status of Service:  Completed, signed off  If discussed at [employer] of Stay Meetings, dates discussed:    Additional Comments:  Carles Collet, RN 10/28/2016, 12:38 PM

## 2016-10-28 NOTE — Progress Notes (Signed)
Patient was given discharge papers and instructions. All instructions were gone over with patient as well as medications. Patient was able to "teach back" all instructions. Last dose of vancomycin was given for the night and patient will return in AM for signed prescription of vancomycin.  Patient ambulated out with husband.  Jimmie Molly, RN

## 2016-10-28 NOTE — Progress Notes (Signed)
Patient called me in to ask when her d/c papers would be ready. I told her there wasn't orders yet but that I would call the MD to see if we were waiting on anything in particular. Patient told her that doctor Gross told her she would be d/c this afternoon. Upon speaking with Md, he asked if I thought she was good to go I told him yes and the patient was wanting to go. He told me that he would be in surgery for 4-5 hours then would write her discharge. I notified patient of what he said.
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
## 2016-10-28 NOTE — Progress Notes (Signed)
Patient asked where d/c papers are, I called [facility] they got in touch with MD Gross, said he will be in surgery another hour then write d/c orders after finished.

## 2016-10-28 NOTE — Discharge Instructions (Signed)
Finish your Vancomycin x 2 weeks to treat your diarrhea   SURGERY: POST OP INSTRUCTIONS (Surgery for small bowel obstruction, colon resection, etc)   ######################################################################  EAT Gradually transition to a high fiber diet with a fiber supplement over the next few days after discharge  WALK Walk an hour a day.  Control your pain to do that.    CONTROL PAIN Control pain so that you can walk, sleep, tolerate sneezing/coughing, go up/down stairs.  HAVE A BOWEL MOVEMENT DAILY Keep your bowels regular to avoid problems.  OK to try a laxative to override constipation.  OK to use an antidairrheal to slow down diarrhea.  Call if not better after 2 tries  CALL IF YOU HAVE PROBLEMS/CONCERNS Call if you are still struggling despite following these instructions. Call if you have concerns not answered by these instructions  ######################################################################   DIET Follow a light diet the first few days at home.  Start with a bland diet such as soups, liquids, starchy foods, low fat foods, etc.  If you feel full, bloated, or constipated, stay on a ful liquid or pureed/blenderized diet for a few days until you feel better and no longer constipated. Be sure to drink plenty of fluids every day to avoid getting dehydrated (feeling dizzy, not urinating, etc.). Gradually add a fiber supplement to your diet over the next week.  Gradually get back to a regular solid diet.  Avoid fast food or heavy meals the first week as you are more likely to get nauseated. It is expected for your digestive tract to need a few months to get back to normal.  It is common for your bowel movements and stools to be irregular.  You will have occasional bloating and cramping that should eventually fade away.  Until you are eating solid food normally, off all pain medications, and back to regular activities; your bowels will not be normal. Focus  on eating a low-fat, high fiber diet the rest of your life (See Getting to [place], below).  CARE of your INCISION or WOUND It is good for closed incision and even open wounds to be washed every day.  Shower every day.  Short baths are fine.  Wash the incisions and wounds clean with soap & water.    If you have a closed incision(s), wash the incision with soap & water every day.  You may leave closed incisions open to air if it is dry.   You may cover the incision with clean gauze & replace it after your daily shower for comfort. If you have skin tapes (Steristrips) or skin glue (Dermabond) on your incision, leave them in place.  They will fall off on their own like a scab.  You may trim any edges that curl up with clean scissors.  If you have staples, set up an appointment for them to be removed in the office in 10 days after surgery.  If you have a drain, wash around the skin exit site with soap & water and place a new dressing of gauze or band aid around the skin every day.  Keep the drain site clean & dry.    If you have an open wound with packing, see wound care instructions.  In general, it is encouraged that you remove your dressing and packing, shower with soap & water, and replace your dressing once a day.  Pack the wound with clean gauze moistened with normal (0.9%) saline to keep the wound moist & uninfected.  Pressure on the dressing for 30 minutes will stop most wound bleeding.  Eventually your body will heal & pull the open wound closed over the next few months.  Raw open wounds will occasionally bleed or secrete yellow drainage until it heals closed.  Drain sites will drain a little until the drain is removed.  Even closed incisions can have mild bleeding or drainage the first few days until the skin edges scab over & seal.   If you have an open wound with a wound vac, see wound vac care instructions.     ACTIVITIES as tolerated Start light daily activities --- self-care,  walking, climbing stairs-- beginning the day after surgery.  Gradually increase activities as tolerated.  Control your pain to be active.  Stop when you are tired.  Ideally, walk several times a day, eventually an hour a day.   Most people are back to most day-to-day activities in a few weeks.  It takes 4-8 weeks to get back to unrestricted, intense activity. If you can walk 30 minutes without difficulty, it is safe to try more intense activity such as jogging, treadmill, bicycling, low-impact aerobics, swimming, etc. Save the most intensive and strenuous activity for last (Usually 4-8 weeks after surgery) such as sit-ups, heavy lifting, contact sports, etc.  Refrain from any intense heavy lifting or straining until you are off narcotics for pain control.  You will have off days, but things should improve week-by-week. DO NOT PUSH THROUGH PAIN.  Let pain be your guide: If it hurts to do something, don't do it.  Pain is your body warning you to avoid that activity for another week until the pain goes down. You may drive when you are no longer taking narcotic prescription pain medication, you can comfortably wear a seatbelt, and you can safely make sudden turns/stops to protect yourself without hesitating due to pain. You may have sexual intercourse when it is comfortable. If it hurts to do something, stop.  MEDICATIONS Take your usually prescribed home medications unless otherwise directed.   Blood thinners:  Usually you can restart any strong blood thinners after the second postoperative day.  It is OK to take aspirin right away.     If you are on strong blood thinners (warfarin/Coumadin, Plavix, Xerelto, Eliquis, Pradaxa, etc), discuss with your surgeon, medicine PCP, and/or cardiologist for instructions on when to restart the blood thinner & if blood monitoring is needed (PT/INR blood check, etc).     PAIN CONTROL Pain after surgery or related to activity is often due to strain/injury to muscle,  tendon, nerves and/or incisions.  This pain is usually short-term and will improve in a few months.  To help speed the process of healing and to get back to regular activity more quickly, DO THE FOLLOWING THINGS TOGETHER: 1. Increase activity gradually.  DO NOT PUSH THROUGH PAIN 2. Use Ice and/or Heat 3. Try Gentle Massage and/or Stretching 4. Take over the counter pain medication 5. Take Narcotic prescription pain medication for more severe pain  Good pain control = faster recovery.  It is better to take more medicine to be more active than to stay in bed all day to avoid medications. 1.  Increase activity gradually Avoid heavy lifting at first, then increase to lifting as tolerated over the next 6 weeks. Do not push through the pain.  Listen to your body and avoid positions and maneuvers than reproduce the pain.  Wait a few days before trying something more intense Walking an  hour a day is encouraged to help your body recover faster and more safely.  Start slowly and stop when getting sore.  If you can walk 30 minutes without stopping or pain, you can try more intense activity (running, jogging, aerobics, cycling, swimming, treadmill, sex, sports, weightlifting, etc.) Remember: If it hurts to do it, then dont do it! 2. Use Ice and/or Heat You will have swelling and bruising around the incisions.  This will take several weeks to resolve. Ice packs or heating pads (6-8 times a day, 30-60 minutes at a time) will help sooth soreness & bruising. Some people prefer to use ice alone, heat alone, or alternate between ice & heat.  Experiment and see what works best for you.  Consider trying ice for the first few days to help decrease swelling and bruising; then, switch to heat to help relax sore spots and speed recovery. Shower every day.  Short baths are fine.  It feels good!  Keep the incisions and wounds clean with soap & water.   3. Try Gentle Massage and/or Stretching Massage at the area of pain  many times a day Stop if you feel pain - do not overdo it 4. Take over the counter pain medication This helps the muscle and nerve tissues become less irritable and calm down faster Choose ONE of the following over-the-counter anti-inflammatory medications: Acetaminophen 500mg  tabs (Tylenol) 1-2 pills with every meal and just before bedtime (avoid if you have liver problems or if you have acetaminophen in you narcotic prescription) Naproxen 220mg  tabs (ex. Aleve, Naprosyn) 1-2 pills twice a day (avoid if you have kidney, stomach, IBD, or bleeding problems) Ibuprofen 200mg  tabs (ex. Advil, Motrin) 3-4 pills with every meal and just before bedtime (avoid if you have kidney, stomach, IBD, or bleeding problems) Take with food/snack several times a day as directed for at least 2 weeks to help keep pain / soreness down & more manageable. 5. Take Narcotic prescription pain medication for more severe pain A prescription for strong pain control is often given to you upon discharge (for example: oxycodone/Percocet, hydrocodone/Norco/Vicodin, or tramadol/Ultram) Take your pain medication as prescribed. Be mindful that most narcotic prescriptions contain Tylenol (acetaminophen) as well - avoid taking too much Tylenol. If you are having problems/concerns with the prescription medicine (does not control pain, nausea, vomiting, rash, itching, etc.), please call us [phone] to see if we need to switch you to a different pain medicine that will work better for you and/or control your side effects better. If you need a refill on your pain medication, you must call the office before 4 pm and on weekdays only.  By federal law, prescriptions for narcotics cannot be called into a pharmacy.  They must be filled out on paper & picked up from our office by the patient or authorized caretaker.  Prescriptions cannot be filled after 4 pm nor on weekends.    WHEN TO CALL us [phone] Severe uncontrolled or worsening  pain  Fever over 101 F (38.5 C) Concerns with the incision: Worsening pain, redness, rash/hives, swelling, bleeding, or drainage Reactions / problems with new medications (itching, rash, hives, nausea, etc.) Nausea and/or vomiting Difficulty urinating Difficulty breathing Worsening fatigue, dizziness, lightheadedness, blurred vision Other concerns If you are not getting better after two weeks or are noticing you are getting worse, contact our office [phone] for further advice.  We may need to adjust your medications, re-evaluate you in the office, send you to the emergency room,  or see what other things we can do to help. The clinic staff is available to answer your questions during regular business hours (8:30am-5pm).  Please dont hesitate to call and ask to speak to one of our nurses for clinical concerns.    A surgeon from [facility] is always on call at the hospitals 24 hours/day If you have a medical emergency, go to the nearest emergency room or call 911.  FOLLOW UP in our office One the day of your discharge from the hospital (or the next business weekday), please call [facility] to set up or confirm an appointment to see your surgeon in the office for a follow-up appointment.  Usually it is 2-3 weeks after your surgery.   If you have skin staples at your incision(s), let the office know so we can set up a time in the office for the nurse to remove them (usually around 10 days after surgery). Make sure that you call for appointments the day of discharge (or the next business weekday) from the hospital to ensure a convenient appointment time. IF YOU HAVE DISABILITY OR FAMILY LEAVE FORMS, BRING THEM TO THE OFFICE FOR PROCESSING.  DO NOT GIVE THEM TO YOUR DOCTOR.  [facility] [address], [place], [place], [place]  [address] ? [phone] - Main [phone] - [place],  [phone] -  Fax [email or web address]  GETTING TO GOOD BOWEL HEALTH. It is expected for your digestive tract to need a few months to get back to normal.  It is common for your bowel movements and stools to be irregular.  You will have occasional bloating and cramping that should eventually fade away.  Until you are eating solid food normally, off all pain medications, and back to regular activities; your bowels will not be normal.   Avoiding constipation The goal: ONE SOFT BOWEL MOVEMENT A DAY!    Drink plenty of fluids.  Choose water first. TAKE A FIBER SUPPLEMENT EVERY DAY THE REST OF YOUR LIFE During your first week back home, gradually add back a fiber supplement every day Experiment which form you can tolerate.   There are many forms such as powders, tablets, wafers, gummies, etc Psyllium bran (Metamucil), methylcellulose (Citrucel), Miralax or Glycolax, Benefiber, Flax Seed.  Adjust the dose week-by-week (1/2 dose/day to 6 doses a day) until you are moving your bowels 1-2 times a day.  Cut back the dose or try a different fiber product if it is giving you problems such as diarrhea or bloating. Sometimes a laxative is needed to help jump-start bowels if constipated until the fiber supplement can help regulate your bowels.  If you are tolerating eating & you are farting, it is okay to try a gentle laxative such as double dose MiraLax, prune juice, or Milk of Magnesia.  Avoid using laxatives too often. Stool softeners can sometimes help counteract the constipating effects of narcotic pain medicines.  It can also cause diarrhea, so avoid using for too long. If you are still constipated despite taking fiber daily, eating solids, and a few doses of laxatives, call our office. Controlling diarrhea Try drinking liquids and eating bland foods for a few days to avoid stressing your intestines further. Avoid dairy products (especially milk & ice cream) for a short time.  The intestines often can lose the  ability to digest lactose when stressed. Avoid foods that cause gassiness or bloating.  Typical foods include beans and other legumes, cabbage, broccoli, and dairy  foods.  Avoid greasy, spicy, fast foods.  Every person has some sensitivity to other foods, so listen to your body and avoid those foods that trigger problems for you. Probiotics (such as active yogurt, Align, etc) may help repopulate the intestines and colon with normal bacteria and calm down a sensitive digestive tract Adding a fiber supplement gradually can help thicken stools by absorbing excess fluid and retrain the intestines to act more normally.  Slowly increase the dose over a few weeks.  Too much fiber too soon can backfire and cause cramping & bloating. It is okay to try and slow down diarrhea with a few doses of antidiarrheal medicines.   Bismuth subsalicylate (ex. Kayopectate, Pepto Bismol) for a few doses can help control diarrhea.  Avoid if pregnant.   Loperamide (Imodium) can slow down diarrhea.  Start with one tablet (2mg ) first.  Avoid if you are having fevers or severe pain.  ILEOSTOMY PATIENTS WILL HAVE CHRONIC DIARRHEA since their colon is not in use.    Drink plenty of liquids.  You will need to drink even more glasses of water/liquid a day to avoid getting dehydrated. Record output from your ileostomy.  Expect to empty the bag every 3-4 hours at first.  Most people with a permanent ileostomy empty their bag 4-6 times at the least.   Use antidiarrheal medicine (especially Imodium) several times a day to avoid getting dehydrated.  Start with a dose at bedtime & breakfast.  Adjust up or down as needed.  Increase antidiarrheal medications as directed to avoid emptying the bag more than 8 times a day (every 3 hours). Work with your wound ostomy nurse to learn care for your ostomy.  See ostomy care instructions. TROUBLESHOOTING IRREGULAR BOWELS 1) Start with a soft & bland diet. No spicy, greasy, or fried foods.  2) Avoid  gluten/wheat or dairy products from diet to see if symptoms improve. 3) Miralax 17gm or flax seed mixed in [place]. water or juice-daily. May use 2-4 times a day as needed. 4) Gas-X, Phazyme, etc. as needed for gas & bloating.  5) Prilosec (omeprazole) over-the-counter as needed 6)  Consider probiotics (Align, Activa, etc) to help calm the bowels down  Call your doctor if you are getting worse or not getting better.  Sometimes further testing (cultures, endoscopy, X-ray studies, CT scans, bloodwork, etc.) may be needed to help diagnose and treat the cause of the diarrhea. [facility], [place], [place], [place], [place]  [address] [phone] - Main.    [phone]  - Toll Free.   [phone] - Fax [email or web address]  GETTING TO GOOD BOWEL HEALTH.  ######################################################################  EAT Gradually transition to a high fiber diet with a fiber supplement over the next few weeks after discharge.  Start with a pureed / full liquid diet (see below)  WALK Walk an hour a day.  Control your pain to do that.    HAVE A BOWEL MOVEMENT DAILY Keep your bowels regular to avoid problems.  OK to try a laxative to override constipation.  OK to use an antidairrheal to slow down diarrhea.  Call if not better after 2 tries  CALL IF YOU HAVE PROBLEMS/CONCERNS Call if you are still struggling despite following these instructions. Call if you have concerns not answered by these instructions  ######################################################################   Irregular bowel habits such as constipation and diarrhea can lead to many problems over time.  Having one soft bowel movement a day is the most important  way to prevent further problems.  The anorectal canal is designed to handle stretching and feces to safely manage our ability to get rid of solid waste (feces, poop, stool) out of our body.  BUT, hard constipated  stools can act like ripping concrete bricks and diarrhea can be a burning fire to this very sensitive area of our body, causing inflamed hemorrhoids, anal fissures, increasing risk is perirectal abscesses, abdominal pain/bloating, an making irritable bowel worse.      The goal: ONE SOFT BOWEL MOVEMENT A DAY!  To have soft, regular bowel movements:   Drink plenty of fluids, consider 4-6 tall glasses of water a day.    Take plenty of fiber.  Fiber is the undigested part of plant food that passes into the colon, acting s natures broom to encourage bowel motility and movement.  Fiber can absorb and hold large amounts of water. This results in a larger, bulkier stool, which is soft and easier to pass. Work gradually over several weeks up to 6 servings a day of fiber (25g a day even more if needed) in the form of: o Vegetables -- Root (potatoes, carrots, turnips), leafy green (lettuce, salad greens, celery, spinach), or cooked high residue (cabbage, broccoli, etc) o Fruit -- Fresh (unpeeled skin & pulp), Dried (prunes, apricots, cherries, etc ),  or stewed ( applesauce)  o Whole grain breads, pasta, etc (whole wheat)  o Bran cereals   Bulking Agents -- This type of water-retaining fiber generally is easily obtained each day by one of the following:  o Psyllium bran -- The psyllium plant is remarkable because its ground seeds can retain so much water. This product is available as Metamucil, Konsyl, Effersyllium, Per Diem Fiber, or the less expensive generic preparation in drug and health food stores. Although labeled a laxative, it really is not a laxative.  o Methylcellulose -- This is another fiber derived from wood which also retains water. It is available as Citrucel. o Polyethylene Glycol - and artificial fiber commonly called Miralax or Glycolax.  It is helpful for people with gassy or bloated feelings with regular fiber o Flax Seed - a less gassy fiber than psyllium  No reading or other  relaxing activity while on the toilet. If bowel movements take longer than 5 minutes, you are too constipated  AVOID CONSTIPATION.  High fiber and water intake usually takes care of this.  Sometimes a laxative is needed to stimulate more frequent bowel movements, but   Laxatives are not a good long-term solution as it can wear the colon out.  They can help jump-start bowels if constipated, but should be relied on constantly without discussing with your doctor o Osmotics (Milk of Magnesia, Fleets phosphosoda, Magnesium citrate, MiraLax, GoLytely) are safer than  o Stimulants (Senokot, Castor Oil, Dulcolax, Ex Lax)    o Avoid taking laxatives for more than 7 days in a row.   IF SEVERELY CONSTIPATED, try a Bowel Retraining Program: o Do not use laxatives.  o Eat a diet high in roughage, such as bran cereals and leafy vegetables.  o Drink six (6) ounces of prune or apricot juice each morning.  o Eat two (2) large servings of stewed fruit each day.  o Take one (1) heaping tablespoon of a psyllium-based bulking agent twice a day. Use sugar-free sweetener when possible to avoid excessive calories.  o Eat a normal breakfast.  o Set aside 15 minutes after breakfast to sit on the toilet, but do not strain to  have a bowel movement.  o If you do not have a bowel movement by the third day, use an enema and repeat the above steps.   Controlling diarrhea o Switch to liquids and simpler foods for a few days to avoid stressing your intestines further. o Avoid dairy products (especially milk & ice cream) for a short time.  The intestines often can lose the ability to digest lactose when stressed. o Avoid foods that cause gassiness or bloating.  Typical foods include beans and other legumes, cabbage, broccoli, and dairy foods.  Every person has some sensitivity to other foods, so listen to our body and avoid those foods that trigger problems for you. o Adding fiber (Citrucel, Metamucil, psyllium, Miralax)  gradually can help thicken stools by absorbing excess fluid and retrain the intestines to act more normally.  Slowly increase the dose over a few weeks.  Too much fiber too soon can backfire and cause cramping & bloating. o Probiotics (such as active yogurt, Align, etc) may help repopulate the intestines and colon with normal bacteria and calm down a sensitive digestive tract.  Most studies show it to be of mild help, though, and such products can be costly. o Medicines: - Bismuth subsalicylate (ex. Kayopectate, Pepto Bismol) every 30 minutes for up to 6 doses can help control diarrhea.  Avoid if pregnant. - Loperamide (Immodium) can slow down diarrhea.  Start with two tablets (4mg  total) first and then try one tablet every 6 hours.  Avoid if you are having fevers or severe pain.  If you are not better or start feeling worse, stop all medicines and call your doctor for advice o Call your doctor if you are getting worse or not better.  Sometimes further testing (cultures, endoscopy, X-ray studies, bloodwork, etc) may be needed to help diagnose and treat the cause of the diarrhea.  TROUBLESHOOTING IRREGULAR BOWELS 1) Avoid extremes of bowel movements (no bad constipation/diarrhea) 2) Miralax 17gm mixed in 8oz. water or juice-daily. May use BID as needed.  3) Gas-x,Phazyme, etc. as needed for gas & bloating.  4) Soft,bland diet. No spicy,greasy,fried foods.  5) Prilosec over-the-counter as needed  6) May hold gluten/wheat products from diet to see if symptoms improve.  7)  May try probiotics (Align, Activa, etc) to help calm the bowels down 7) If symptoms become worse call back immediately.  Pelvic floor muscle training exercises ("Kegels") can help strengthen the muscles under the uterus, bladder, and bowel (large intestine). They can help both men and women who have problems with urine leakage or bowel control.  A pelvic floor muscle training exercise is like pretending that you have to urinate,  and then holding it. You relax and tighten the muscles that control urine flow. It's important to find the right muscles to tighten.  The next time you have to urinate, start to go and then stop. Feel the muscles in your vagina, bladder, or anus get tight and move up. These are the pelvic floor muscles. If you feel them tighten, you've done the exercise right. If you are still not sure whether you are tightening the right muscles, keep in mind that all of the muscles of the pelvic floor relax and contract at the same time. Because these muscles control the bladder, rectum, and vagina, the following tips may help: Women: Insert a finger into your vagina. Tighten the muscles as if you are holding in your urine, then let go. You should feel the muscles tighten and move up and  down.  Men: Insert a finger into your rectum. Tighten the muscles as if you are holding in your urine, then let go. You should feel the muscles tighten and move up and down. These are the same muscles you would tighten if you were trying to prevent yourself from passing gas.  It is very important that you keep the following muscles relaxed while doing pelvic floor muscle training exercises: Abdominal  Buttocks (the deeper, anal sphincter muscle should contract)  Thigh   A woman can also strengthen these muscles by using a vaginal cone, which is a weighted device that is inserted into the vagina. Then you try to tighten the pelvic floor muscles to hold the device in place. If you are unsure whether you are doing the pelvic floor muscle training correctly, you can use biofeedback and electrical stimulation to help find the correct muscle group to work. Biofeedback is a method of positive reinforcement. Electrodes are placed on the abdomen and along the anal area. Some therapists place a sensor in the vagina in women or anus in men to monitor the contraction of pelvic floor muscles.  A monitor will display a graph showing which muscles  are contracting and which are at rest. The therapist can help find the right muscles for performing pelvic floor muscle training exercises.   PERFORMING PELVIC FLOOR EXERCISES: 1. Begin by emptying your bladder. 2. Tighten the pelvic floor muscles and hold for a count of 10. 3. Relax the muscles completely for a count of 10. 4. Do 10 repititions, 3 to 5 times a day (morning, afternoon, and night). You can do these exercises at any time and any place. Most people prefer to do the exercises while lying down or sitting in a chair. After 4 - 6 weeks, most people notice some improvement. It may take as long as 3 months to see a major change. After a couple of weeks, you can also try doing a single pelvic floor contraction at times when you are likely to leak (for example, while getting out of a chair). A word of caution: Some people feel that they can speed up the progress by increasing the number of repetitions and the frequency of exercises. However, over-exercising can instead cause muscle fatigue and increase urine leakage. If you feel any discomfort in your abdomen or back while doing these exercises, you are probably doing them wrong. Breathe deeply and relax your body when you are doing these exercises. Make sure you are not tightening your stomach, thigh, buttock, or chest muscles. When done the right way, pelvic floor muscle exercises have been shown to be very effective at improving urinary continence.  Pelvic Floor Pain / Incontinence  Do you suffer from pelvic pain or incontinence? Do you have pain in the pelvis, low back or hips that is associated with sitting, walking, urination or intercourse? Have you experienced leaking of urine or feces when coughing, sneezing or laughing? Do you have pain in the pelvic area associated with cancer?  These are conditions that are common with pelvic floor muscle dysfunction. Over time, due to stress, scar tissue, surgeries and the natural course of aging,  our muscles may become weak or overstressed and can spasm. This can lead to pain, weakness, incontinence or decreased quality of life.  Men and women with pelvic floor dysfunction frequently describe:  A falling out feeling. Pain or burning in the abdomen, tailbone or perineal area. Constipation or bowel elimination problems or difficulty initiating urination. Unresolved low  back or hip pain. Frequency and urgency when going to the bathroom. Leaking of urine or feces. Pain with intercourse.  [email or web address]  To make a referral or for more information about [facility] Pelvic Floor Therapy Program, call  [facility]) - [place] [phone] [place]) - [facility]) - [phone]
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
## 2016-11-18 ENCOUNTER — Ambulatory Visit (HOSPITAL_BASED_OUTPATIENT_CLINIC_OR_DEPARTMENT_OTHER): Payer: BLUE CROSS/BLUE SHIELD | Admitting: Hematology

## 2016-11-18 ENCOUNTER — Encounter: Payer: Self-pay | Admitting: Hematology

## 2016-11-18 ENCOUNTER — Other Ambulatory Visit (HOSPITAL_BASED_OUTPATIENT_CLINIC_OR_DEPARTMENT_OTHER): Payer: BLUE CROSS/BLUE SHIELD

## 2016-11-18 VITALS — BP 119/73 | HR 72 | Temp 98.0°F | Resp 18 | Ht 63.0 in | Wt 181.9 lb

## 2016-11-18 DIAGNOSIS — D649 Anemia, unspecified: Secondary | ICD-10-CM

## 2016-11-18 DIAGNOSIS — C20 Malignant neoplasm of rectum: Secondary | ICD-10-CM | POA: Diagnosis not present

## 2016-11-18 LAB — COMPREHENSIVE METABOLIC PANEL
ALBUMIN: 3.2 g/dL — AB (ref 3.5–5.0)
ALK PHOS: 60 U/L (ref 40–150)
ALT: 10 U/L (ref 0–55)
ANION GAP: 10 meq/L (ref 3–11)
AST: 12 U/L (ref 5–34)
BILIRUBIN TOTAL: 0.78 mg/dL (ref 0.20–1.20)
BUN: 18.6 mg/dL (ref 7.0–26.0)
CALCIUM: 9.6 mg/dL (ref 8.4–10.4)
CO2: 26 mEq/L (ref 22–29)
Chloride: 105 mEq/L (ref 98–109)
Creatinine: 0.8 mg/dL (ref 0.6–1.1)
EGFR: 86 mL/min/{1.73_m2} — AB (ref >90)
Glucose: 96 mg/dl (ref 70–140)
POTASSIUM: 4.2 meq/L (ref 3.5–5.1)
Sodium: 141 mEq/L (ref 136–145)
TOTAL PROTEIN: 7 g/dL (ref 6.4–8.3)

## 2016-11-18 LAB — CBC WITH DIFFERENTIAL/PLATELET
BASO%: 0.2 % (ref 0.0–2.0)
BASOS ABS: 0 10*3/uL (ref 0.0–0.1)
EOS ABS: 0.1 10*3/uL (ref 0.0–0.5)
EOS%: 1.2 % (ref 0.0–7.0)
HCT: 30.7 % — ABNORMAL LOW (ref 34.8–46.6)
HEMOGLOBIN: 9.6 g/dL — AB (ref 11.6–15.9)
LYMPH%: 23.7 % (ref 14.0–49.7)
MCH: 25.8 pg (ref 25.1–34.0)
MCHC: 31.3 g/dL — ABNORMAL LOW (ref 31.5–36.0)
MCV: 82.5 fL (ref 79.5–101.0)
MONO#: 1 10*3/uL — ABNORMAL HIGH (ref 0.1–0.9)
MONO%: 11.2 % (ref 0.0–14.0)
NEUT#: 5.4 10*3/uL (ref 1.5–6.5)
NEUT%: 63.7 % (ref 38.4–76.8)
Platelets: 324 10*3/uL (ref 145–400)
RBC: 3.72 10*6/uL (ref 3.70–5.45)
RDW: 15.2 % — ABNORMAL HIGH (ref 11.2–14.5)
WBC: 8.6 10*3/uL (ref 3.9–10.3)
lymph#: 2 10*3/uL (ref 0.9–3.3)

## 2016-11-24 ENCOUNTER — Ambulatory Visit: Payer: Self-pay | Admitting: Surgery

## 2017-01-11 ENCOUNTER — Ambulatory Visit: Payer: BLUE CROSS/BLUE SHIELD | Attending: Surgery | Admitting: Physical Therapy

## 2017-01-11 ENCOUNTER — Encounter: Payer: Self-pay | Admitting: Physical Therapy

## 2017-01-11 DIAGNOSIS — M6281 Muscle weakness (generalized): Secondary | ICD-10-CM | POA: Diagnosis not present

## 2017-01-11 DIAGNOSIS — Z483 Aftercare following surgery for neoplasm: Secondary | ICD-10-CM | POA: Insufficient documentation

## 2017-01-11 DIAGNOSIS — R278 Other lack of coordination: Secondary | ICD-10-CM | POA: Diagnosis present

## 2017-01-11 DIAGNOSIS — R252 Cramp and spasm: Secondary | ICD-10-CM | POA: Diagnosis present

## 2017-01-11 NOTE — Patient Instructions (Addendum)
About Abdominal Massage  Abdominal massage, also called external colon massage, is a self-treatment circular massage technique that can reduce and eliminate gas and ease constipation. The colon naturally contracts in waves in a clockwise direction starting from inside the right hip, moving up toward the ribs, across the belly, and down inside the left hip.  When you perform circular abdominal massage, you help stimulate your colon's normal wave pattern of movement called peristalsis.  It is most beneficial when done after eating.  Positioning You can practice abdominal massage with oil while lying down, or in the shower with soap.  Some people find that it is just as effective to do the massage through clothing while sitting or standing.  How to Massage Start by placing your finger tips or knuckles on your right side, just inside your hip bone.  . Make small circular movements while you move upward toward your rib cage.   . Once you reach the bottom right side of your rib cage, take your circular movements across to the left side of the bottom of your rib cage.  . Next, move downward until you reach the inside of your left hip bone.  This is the path your feces travel in your colon. . Continue to perform your abdominal massage in this pattern for 10 minutes each day.     You can apply as much pressure as is comfortable in your massage.  Start gently and build pressure as you continue to practice.  Notice any areas of pain as you massage; areas of slight pain may be relieved as you massage, but if you have areas of significant or intense pain, consult with your healthcare provider.  Other Considerations . General physical activity including bending and stretching can have a beneficial massage-like effect on the colon.  Deep breathing can also stimulate the colon because breathing deeply activates the same nervous system that supplies the colon.   . Abdominal massage should always be used in  combination with a bowel-conscious diet that is high in the proper type of fiber for you, fluids (primarily water), and a regular exercise program. Quick Contraction: Gravity Resisted (Sitting)    Sitting, quickly squeeze then fully relax pelvic floor. Perform __1_ sets of 2___. Rest for _1__ seconds between sets. Do after a bowel movement.  Copyright  VHI. All rights reserved.  Bear Down    Exhaling, bear down as if to have a bowel movement. Then tighten the anus Repeat _5__ times. Do _3__ times a day.  Copyright  VHI. All rights reserved.  [facility] [address] [place], [place] [address] Phone # [phone] Fax [phone]
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
## 2017-01-13 ENCOUNTER — Encounter: Payer: Self-pay | Admitting: Gynecology

## 2017-01-19 ENCOUNTER — Encounter: Payer: Self-pay | Admitting: Physical Therapy

## 2017-01-19 ENCOUNTER — Ambulatory Visit: Payer: BLUE CROSS/BLUE SHIELD | Admitting: Physical Therapy

## 2017-01-19 DIAGNOSIS — R278 Other lack of coordination: Secondary | ICD-10-CM

## 2017-01-19 DIAGNOSIS — M6281 Muscle weakness (generalized): Secondary | ICD-10-CM

## 2017-01-19 DIAGNOSIS — Z483 Aftercare following surgery for neoplasm: Secondary | ICD-10-CM

## 2017-01-19 DIAGNOSIS — R252 Cramp and spasm: Secondary | ICD-10-CM
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
## 2017-01-19 NOTE — Patient Instructions (Addendum)
Isometric Hold (Sitting)    Sit upright. Slowly inhale, and then exhale. Pull navel toward spine and Hold for _5__ seconds. Continue to breathe in and out during hold. Rest for _5__ seconds. Repeat _5__ times. Do __3_ times a day.  Copyright  VHI. All rights reserved.  Isometric Hold (Hook-Lying)    Lie with hips and knees bent. Slowly inhale, and then exhale. Pull navel toward spine and Hold for _5__ seconds. Continue to breathe in and out during hold. Rest for 5___ seconds. Repeat _5__ times. Do _2__ times a day.   Copyright  VHI. All rights reserved.  Hook-Lying    Lie with hips and knees bent. Allow body's muscles to relax. Place hands on belly. Inhale slowly and deeply for __1_ seconds, so hands move up. Then take _1__ seconds to exhale. Repeat _5__ times. Do _2__ times a day.   Copyright  VHI. All rights reserved.  Sitting    Sit comfortably. Allow body's muscles to relax. Place hands on belly. Inhale slowly and deeply for __5_ seconds, so hands move out. Then take _5__ seconds to exhale. Repeat 5___ times. Do _2__ times a day.  Copyright  VHI. All rights reserved.  [facility] [address] [place], [place] [address] Phone # [phone] Fax [phone]

## 2017-01-27 ENCOUNTER — Other Ambulatory Visit: Payer: BLUE CROSS/BLUE SHIELD

## 2017-01-28 ENCOUNTER — Other Ambulatory Visit: Payer: BLUE CROSS/BLUE SHIELD

## 2017-01-28 ENCOUNTER — Encounter: Payer: BLUE CROSS/BLUE SHIELD | Admitting: Physical Therapy

## 2017-02-04 ENCOUNTER — Encounter: Payer: Self-pay | Admitting: Physical Therapy

## 2017-02-04 ENCOUNTER — Ambulatory Visit: Payer: BLUE CROSS/BLUE SHIELD | Attending: Surgery | Admitting: Physical Therapy

## 2017-02-04 DIAGNOSIS — M6281 Muscle weakness (generalized): Secondary | ICD-10-CM | POA: Insufficient documentation

## 2017-02-04 DIAGNOSIS — R252 Cramp and spasm: Secondary | ICD-10-CM

## 2017-02-04 DIAGNOSIS — Z483 Aftercare following surgery for neoplasm: Secondary | ICD-10-CM | POA: Insufficient documentation

## 2017-02-04 DIAGNOSIS — R278 Other lack of coordination: Secondary | ICD-10-CM

## 2017-02-04 NOTE — Patient Instructions (Addendum)
Lower abdominal/core stability exercises  1. Practice your breathing technique: Inhale through your nose expanding your belly and rib cage. Try not to breathe into your chest. Exhale slowly and gradually out your mouth feeling a sense of softness to your body. Practice multiple times. This can be performed unlimited.  2. Finding the lower abdominals. Laying on your back with the knees bent, place your fingers just below your belly button. Using your breathing technique from above, on your exhale gently pull the belly button away from your fingertips without tensing any other muscles. Practice this 5x. Next, as you exhale, draw belly button inwards and hold onto it...then feel as if you are pulling that muscle across your pelvis like you are tightening a belt. This can be hard to do at first so be patient and practice. Do 5-10 reps 1-3 x day. Always recognize quality over quantity; if your abdominal muscles become tired you will notice you may tighten/contract other muscles. This is the time to take a break.   Practice this first laying on your back, then in sitting, progressing to standing and finally adding it to all your daily movements.   3. Finding your pelvic floor. Using the breathing technique above, when your exhale, this time draw your pelvic floor muscles up as if you were attempting to stop the flow of urination. Be careful NOT to tense any other muscles. This can be hard, BE PATIENT. Try to hold up to 10 seconds repeating 10x. Try 2x a day. Once you feel you are doing this well, add this contraction to exercise #2. First contracting your pelvic floor followed by lower abdominals.   4. Adding leg movements. Add the following leg movements to challenge your ability to keep your core stable:  1. Single leg drop outs: Laying on your back with knees bent feet flat. Inhale,  dropping one knee outward KEEPING YOUR PELVIS STILL. Exhale as you bring the leg back, simultaneously performing your lower  abdominal contraction. Do 5-10 on each leg. Do not let your hips move.    2. Marching: While keeping your pelvis still, lift the right foot a few inches, put it down then lift left foot. This will mimic a march. Start slow to establish control. Once you have control you may speed it up. Do 10-20x. You MUST keep your lower abdominlas contracted while you march. Breathe naturally    3. Single leg slides: Inhale while you slowly slide one leg out keeping your pelvis still. Only slide your leg as far as you can keep your pelvis still. Exhale as you bring the leg back to the start, contracting the lower abdominals as you do that. Keep your upper body relaxed. Do 5-10 on each side.         Sit to Stand: Phase 1    Sitting, squeeze pelvic floor and hold. Lean trunk forward. Repeat __5_ times. Do _3__ times a day.  Copyright  VHI. All rights reserved.    Sit to Stand: Phase 3    Sitting, squeeze pelvic floor and hold. Lean trunk forward. Push up on arms and stand up. Relax. Do when you get in and out of chair.  Breath out Copyright  VHI. All rights reserved.  Slow Contraction: Gravity Resisted (Standing)    Standing, slowly squeeze pelvic floor for __5_ seconds. Rest for _5__ seconds. Repeat _10__ times. Do __2_ times a day. To progress work toward 10 seconds Copyright  VHI. All rights reserved.   Quick Contraction: Gravity  Resisted (Standing)    Standing, quickly squeeze then fully relax pelvic floor. Perform __1_ sets of _5__. Rest for _1__ seconds between sets. Do _2__ times a day.  Copyright  VHI. All rights reserved.    Scar Massage  Scar massage is done to improve the mobility of scar, decrease scar tissue from building up, reduce adhesions, and prevent Keloids from forming. Start scar massage after scabs have fallen off by themselves and no open areas. The first few weeks after surgery, it is normal for a scar to appear pink or red and slightly raised. Scars can  itch or have areas of numbness. Some scars may be sensitive.   Direct Scar massage: after scar is healed, no opening, no scab 1.  Place pads of two fingers together directly on the scar starting at one end of the scar. Move the fingers up and down across the scar holding 5 seconds one direction.  Then go opposite direction hold 5 seconds.  2. Move over to the next section of the scar and repeat.  Work your way along the entire length of the scar.   3. Next make diagonal movements along the scar holding 5 seconds at one direction. 4. Next movement is side to side. 5. Do not rub fingers over the scar.  Instead keep firm pressure and move scar over the tissue it is on top   Scar Lift and Roll 12 weeks after surgery. 1. Pinch a small amount of the scar between your first two fingers and thumb.  2. Roll the scar between your fingers for 5 to 15 seconds. 3. Move along the scar and repeat until you have massaged the entire length of scar.   Stop the massage and call your doctor if you notice: 1. Increased redness 2. Bleeding from scar 3. Seepage coming from the scar 4. Scar is warmer and has increased pain  [facility] [address], [place], [place] [address] Phone # [phone] Fax [phone] [email or web address]
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
## 2017-02-12 ENCOUNTER — Encounter: Payer: BLUE CROSS/BLUE SHIELD | Admitting: Physical Therapy

## 2017-02-18 ENCOUNTER — Encounter: Payer: BLUE CROSS/BLUE SHIELD | Admitting: Physical Therapy

## 2017-02-24 ENCOUNTER — Encounter: Payer: BLUE CROSS/BLUE SHIELD | Admitting: Physical Therapy

## 2017-02-25 ENCOUNTER — Encounter (HOSPITAL_COMMUNITY): Payer: Self-pay

## 2017-03-16 ENCOUNTER — Encounter (HOSPITAL_COMMUNITY): Payer: Self-pay

## 2017-03-24 ENCOUNTER — Other Ambulatory Visit: Payer: BLUE CROSS/BLUE SHIELD

## 2017-03-24 ENCOUNTER — Ambulatory Visit: Payer: BLUE CROSS/BLUE SHIELD | Admitting: Hematology

## 2017-03-25 ENCOUNTER — Ambulatory Visit: Payer: BLUE CROSS/BLUE SHIELD | Admitting: Hematology

## 2017-03-25 ENCOUNTER — Other Ambulatory Visit: Payer: BLUE CROSS/BLUE SHIELD

## 2017-11-10 IMAGING — CT CT ABD-PELV W/ CM
4 of 5 series · 9 of 46 positions shown, 13 images · IV contrast (APPLIED)
Comparison: CT the abdomen and pelvis 10/24/2012.

ADDENDUM:
Mentioned in the original report, but not conveyed in the impression
is the presence of a 4.5 x 3.1 x 3.6 cm simple appearing cystic
lesion in the left adnexa, presumably an ovarian cyst. Further
characterization with nonemergent pelvic ultrasound is recommended
in the near future to better define this lesion and exclude
underlying cystic neoplasm in this postmenopausal patient. This
recommendation follows ACR consensus guidelines: White Paper of the
ACR Incidental Findings Committee II on Adnexal Findings. [HOSPITAL] [DATE].
CLINICAL DATA: 59-year-old female with prior history of large
rectal polyp in 4442 status post low anterior resection. Recently
diagnosed with rectal cancer, status post transanal endoscopic
microsurgery (TEM) for a rectal lesion, which was subsequently
pathologically diagnosed as T2 disease. Evaluate for metastatic
disease.

EXAM:
CT CHEST, ABDOMEN, AND PELVIS WITH CONTRAST
TECHNIQUE: Multidetector CT imaging of the chest, abdomen and pelvis was
performed following the standard protocol during bolus
administration of intravenous contrast.
CONTRAST:  100mL [id number] IOPAMIDOL ([id number]) INJECTION 61%

[Series 2: chest/abd/pelvis w/cm · axial · 0.86mm/px · z∈[-240,-168]mm · 2 of 204 slices shown]
[im 24/204  soft-tissue]
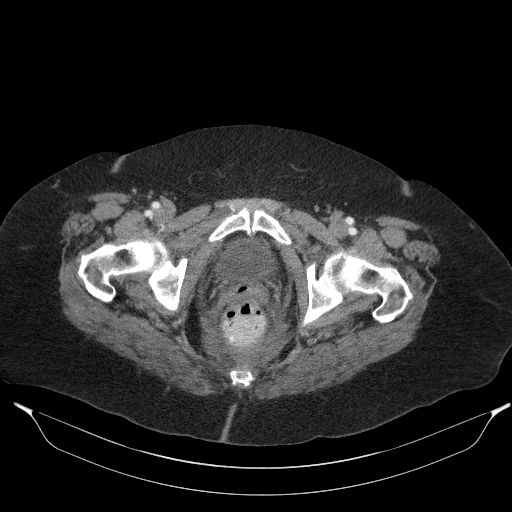
[im 48/204  soft-tissue]
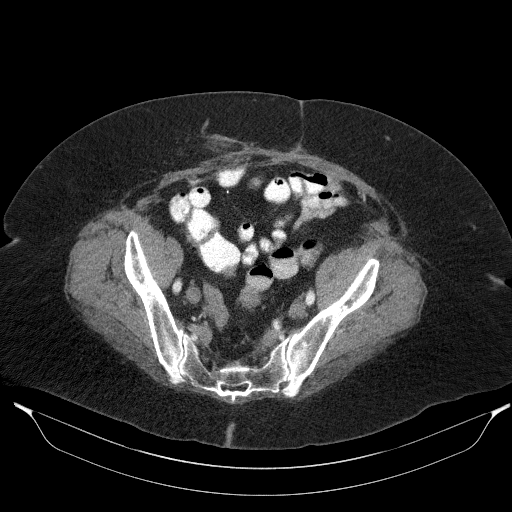

[Series 3: cor · coronal · 0.87mm/px · 3 of 94 slices shown]
[im 32/94  soft-tissue]
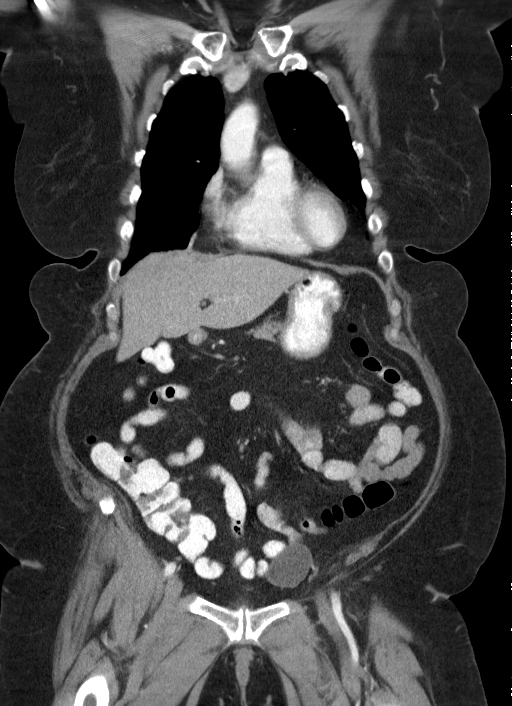
[im 42/94  soft-tissue]
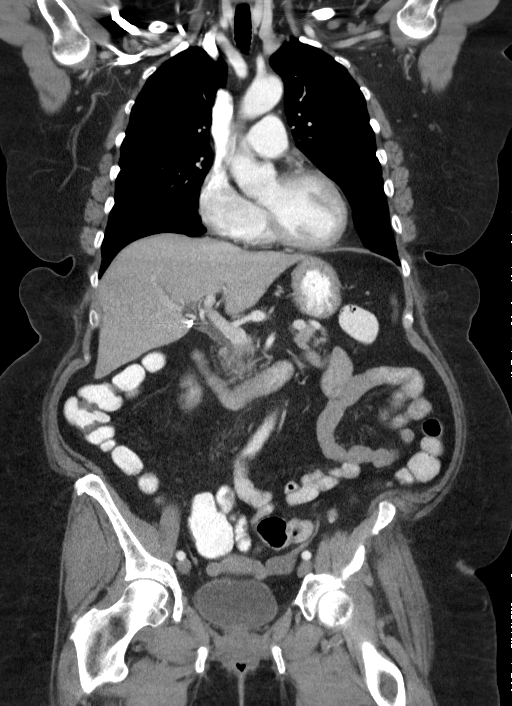
[im 52/94  soft-tissue]
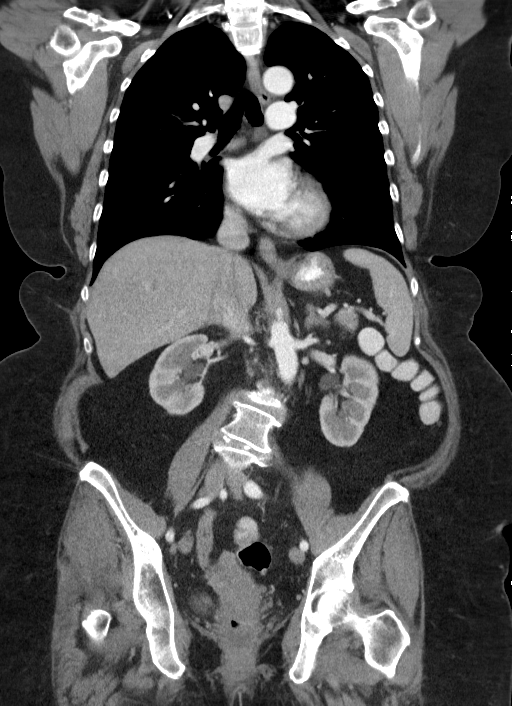

[Series 4: sag · sagittal · 0.62mm/px · 1 of 129 slices shown]
[im 43/129  soft-tissue]
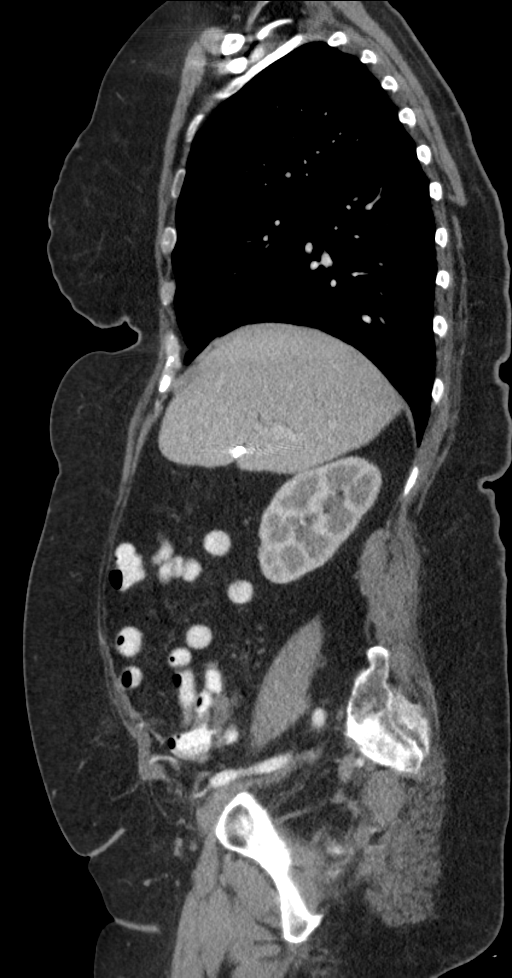

[Series 7: renal delay · axial · delayed · 0.86mm/px · z∈[-114,+86]mm · 3 of 41 slices shown, 7 images]
[im 1/41  soft-tissue]
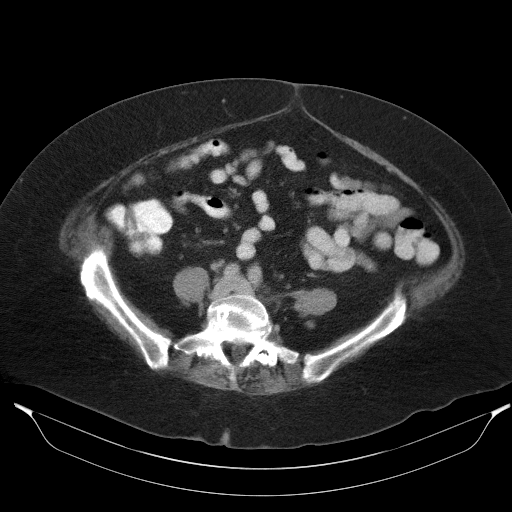
[im 1/41  lung]
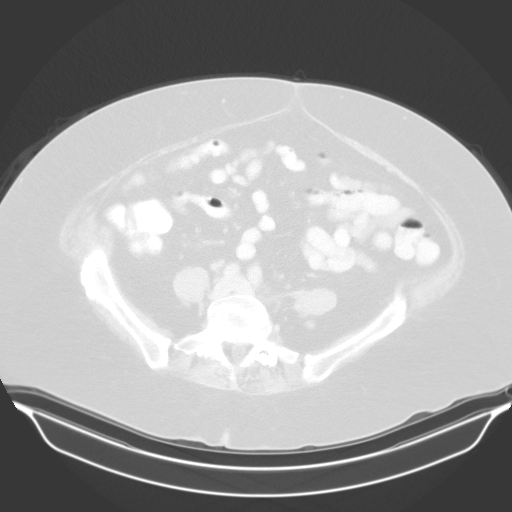
[im 1/41  bone]
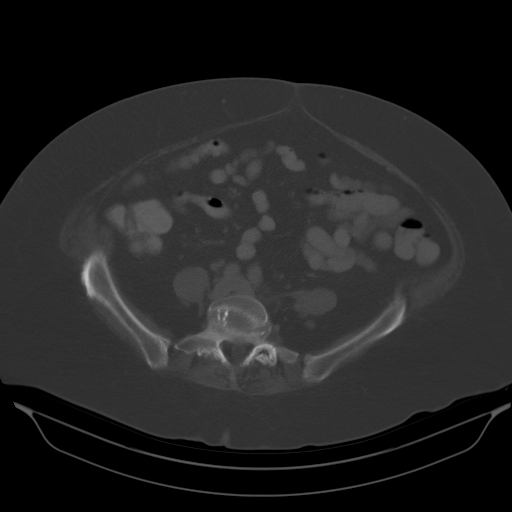
[im 21/41  soft-tissue]
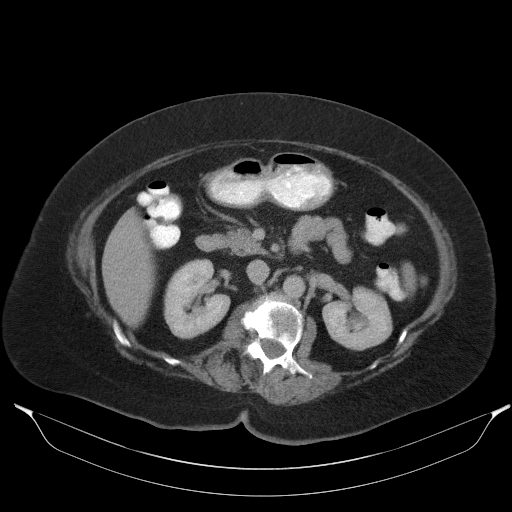
[im 21/41  lung]
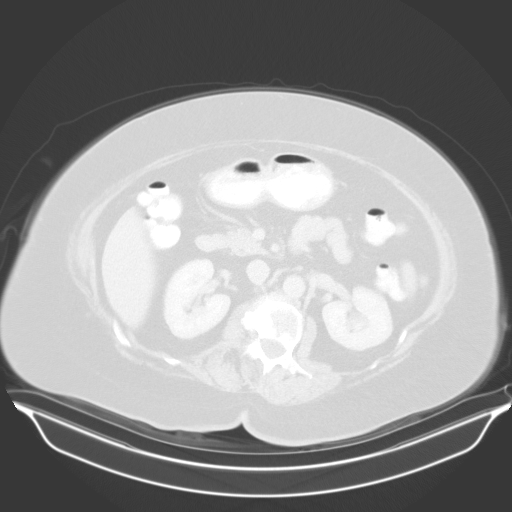
[im 41/41  soft-tissue]
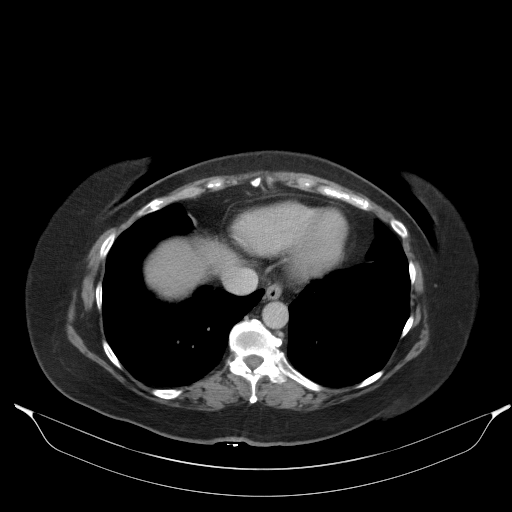
[im 41/41  lung]
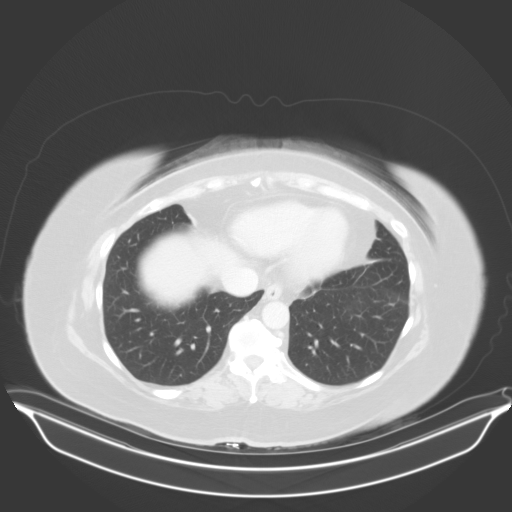

[9 of 46 positions shown; findings below may reference images not displayed]

FINDINGS: CT CHEST FINDINGS

Cardiovascular: Heart size is normal. There is no significant
pericardial fluid, thickening or pericardial calcification.

Mediastinum/Nodes: No pathologically enlarged mediastinal or hilar
lymph nodes. Esophagus is unremarkable in appearance. No axillary
lymphadenopathy.

Lungs/Pleura: There are no suspicious appearing pulmonary nodules or
masses. There is no acute consolidative airspace disease. No pleural
effusions. Scattered areas of very mild linear scarring are noted,
most evident in the inferior segment of the lingula, medial segment
of the right middle lobe and inferior aspect of the left lower lobe.

Musculoskeletal: There are no aggressive appearing lytic or blastic
lesions noted in the visualized portions of the skeleton.

CT ABDOMEN PELVIS FINDINGS

Hepatobiliary: No cystic or solid hepatic lesions. No intra or
extrahepatic biliary ductal dilatation. Status post cholecystectomy.

Pancreas: No pancreatic mass. No pancreatic ductal dilatation. No
pancreatic or peripancreatic fluid or inflammatory changes.

Spleen: Unremarkable.

Adrenals/Urinary Tract: Bilateral kidneys and bilateral adrenal
glands are normal in appearance. There is no hydroureteronephrosis.
Urinary bladder is normal in appearance.

Stomach/Bowel: The appearance of the stomach is normal. There is no
pathologic dilatation of small bowel or colon. Postoperative changes
of low anterior resection are noted, with a suture line in the
distal rectum. No discrete rectal mass is confidently identified on
today's examination, although there is some apparent mural
thickening in the distal rectum, best appreciated on image 175 of
series 2, which could simply represent a redundant mucosal fold in
the setting of an under distended rectum (underlying neoplasm in
this region is difficult to exclude). There is poor definition in
the surrounding mesorectal fat planes, with several areas of
apparent soft tissue extension into the surrounding meso rectal fat,
most pronounced superiorly (axial image 168 of series 2). Several
adjacent mesorectal lymph nodes appear very prominent, the do not
meet CT criteria for enlargement, measuring up to only 6 mm (image
175 of series 2). Haziness and stranding in the adjacent mesorectal
fat is also noted. Thickening of the presacral soft tissues. These
features are all new compared to remote prior study 10/24/2012.
There are several colonic diverticulae, including diverticulae in
this region. How much of this mesorectal appearance is related to
acute inflammation versus neoplasm is uncertain. Remaining portions
of the colon are otherwise grossly unremarkable in appearance. The
appendix is not confidently identified and may be surgically absent.
Regardless, there are no inflammatory changes noted adjacent to the
cecum to suggest the presence of an acute appendicitis at this time.

Vascular/Lymphatic: No significant atherosclerotic disease, aneurysm
or dissection is noted in the abdominal or pelvic vasculature.
Several prominent but non enlarged meso rectal lymph nodes
(discussed above) are nonspecific, but given the overall appearance
of the distal rectum are concerning for possible nodal disease. No
other lymphadenopathy is noted elsewhere in the abdomen or pelvis.

Reproductive: Uterus is slightly heterogeneous in appearance with
multiple small lesions, measuring up to 1.4 cm extending
exophytically off the right side of the uterine fundus, compatible
with multiple small fibroids. Right ovary is unremarkable in
appearance. 4.5 x 3.1 x 3.6 cm low-attenuation lesion in the left
ovary is simple in appearance, favored to represent a cyst.

Other: Trace volume of ascites in the low anatomic pelvis. Small
ventral hernia in the inferior aspect of the anterior abdominal wall
slightly to the right of midline containing a short loop of small
bowel. No pneumoperitoneum.

Musculoskeletal: 1 cm sclerotic lesion in the left ilium is
well-defined and unchanged compared to prior study 10/24/2012,
presumably a bone island or other benign lesion. There are no other
aggressive appearing lytic or blastic lesions noted in the
visualized portions of the skeleton.
IMPRESSION: 1. Asymmetric mural thickening in the distal rectum with soft tissue
extension into the surrounding mesorectal fat, as well as extensive
haziness and stranding in the surrounding mesorectal fat, as well as
multiple prominent (but non enlarged) surrounding mesorectal lymph
nodes. Findings are concerning for local extension of disease
involving both the mesorectal fat and the adjacent mesorectal lymph
nodes. No distant metastatic disease is noted elsewhere in the
chest, abdomen or pelvis.
2. Trace volume of free fluid in the low anatomic pelvis.
3. Colonic diverticulosis. There is a possibility that some of the
changes in the meso rectum and distal mesocolon could be
inflammatory (i.e., indicative of acute diverticulitis), however,
this is not favored.
4. Small right paramidline inferior abdominal wall ventral hernia
containing a short loop of small bowel. No evidence of bowel
incarceration or obstruction at this time.
5. Additional incidental findings, as above.

## 2018-03-01 ENCOUNTER — Encounter: Payer: Self-pay | Admitting: Women's Health

## 2018-03-01 ENCOUNTER — Ambulatory Visit (INDEPENDENT_AMBULATORY_CARE_PROVIDER_SITE_OTHER): Payer: 59 | Admitting: Women's Health

## 2018-03-01 VITALS — BP 118/80 | Ht 62.0 in | Wt 216.0 lb

## 2018-03-01 DIAGNOSIS — Z13820 Encounter for screening for osteoporosis: Secondary | ICD-10-CM

## 2018-03-01 DIAGNOSIS — Z01419 Encounter for gynecological examination (general) (routine) without abnormal findings: Secondary | ICD-10-CM | POA: Diagnosis not present

## 2018-03-01 NOTE — Progress Notes (Signed)
[name] 01-28-1956 [id number]    History:    Presents for annual exam. Postmenopausal/no HRT/no bleeding. Minimal hot flashes. Normal Pap and mammogram history. History of a persistent 3-4 cm ovarian cyst negative Ca-125. 4 small fibroids. 12/25/2006 high grade dysplastic polyp-colostomy was reversed in 12-25-07, benign polyp colonoscopy 12-25-10. 05/2016 BSO rectal cancer, loop ileostomy that was reversed in 10/2016 and has had chronic diarrhea since without weight loss.  Chronic diarrhea, managed by gastroenterologist. 12-24-12 DEXA T score -1.8 at femoral neck FRAX 6.9%/0.57%. Sister died of ovarian cancer 2014-12-25.   Past medical history, past surgical history, family history and social history were all reviewed and documented in the EPIC chart. Dog groomer. History of kidney stones. Husband, 3 kids [place] has Mediterranean fever), and 3 grandkids are doing well.  ROS:  A ROS was performed and pertinent positives and negatives are included.  Exam:  Vitals:   03/01/18 0929  BP: 118/80  Weight: 216 lb (98 kg)  Height: 5\' 2"  (1.575 m)   Body mass index is 39.51 kg/m.   General appearance:  Normal Thyroid:  Symmetrical, normal in size, without palpable masses or nodularity. Respiratory  Auscultation:  Clear without wheezing or rhonchi Cardiovascular  Auscultation:  Regular rate, without rubs, murmurs or gallops  Edema/varicosities:  Not grossly evident Abdominal  Soft,nontender, without masses, guarding or rebound.  Liver/spleen:  No organomegaly noted  Hernia:  None appreciated  Skin  Inspection:  Grossly normal   Breasts: Examined lying and sitting.     Right: Without masses, retractions, discharge or axillary adenopathy.     Left: Without masses, retractions, discharge or axillary adenopathy. Gentitourinary   Inguinal/mons:  Normal without inguinal adenopathy  External genitalia:  Normal  BUS/Urethra/Skene's glands:  Normal  Vagina:  Mild atrophy  Cervix:  Normal  Uterus:  Normal  in size, shape and contour.  Midline and mobile  Adnexa/parametria:     Rt: Without masses or tenderness.   Lt: Without masses or tenderness.  Anus and perineum: Normal  Digital rectal exam: Deferred followed by GI  Assessment/Plan:  62 y.o. MWF G3P3  for annual exam with complaint of chronic diarrhea. Appears well  Postmenopausal/no HRT/no bleeding/BSO Osteopenia without elevated FRAX Obesity Chronic diarrhea, managed by gastroenterologist Labs, by primary care provider  Plan: SBEs. Schedule mammogram and DEXA. Continue chronic diarrhea management with gastroenterologist. Review importance of home safety, and fall prevention. Encourage vitamin D 1998-12-25, shingles vaccination, and water-based lubricant with sexual activity. Discuss low carb diet and exercise for weight loss.  Pap with HR HPV typing.  [name] [facility], 10:11 AM 03/01/2018

## 2018-03-04 LAB — PAP, TP IMAGING W/ HPV RNA, RFLX HPV TYPE 16,18/45: HPV DNA HIGH RISK: NOT DETECTED

## 2019-03-06 ENCOUNTER — Other Ambulatory Visit: Payer: Self-pay

## 2019-03-07 ENCOUNTER — Encounter: Payer: Self-pay | Admitting: Women's Health

## 2019-03-07 ENCOUNTER — Ambulatory Visit (INDEPENDENT_AMBULATORY_CARE_PROVIDER_SITE_OTHER): Payer: 59 | Admitting: Women's Health

## 2019-03-07 VITALS — BP 124/80 | Ht 62.0 in | Wt 215.0 lb

## 2019-03-07 DIAGNOSIS — Z01419 Encounter for gynecological examination (general) (routine) without abnormal findings: Secondary | ICD-10-CM | POA: Diagnosis not present

## 2019-03-07 DIAGNOSIS — Z13220 Encounter for screening for lipoid disorders: Secondary | ICD-10-CM | POA: Diagnosis not present

## 2019-03-07 DIAGNOSIS — R197 Diarrhea, unspecified: Secondary | ICD-10-CM | POA: Diagnosis not present

## 2019-03-07 NOTE — Patient Instructions (Signed)
Breast center [phone]   Health Maintenance for Postmenopausal Women Menopause is a normal process in which your ability to get pregnant comes to an end. This process happens slowly over many months or years, usually between the ages of 73 and 39. Menopause is complete when you have missed your menstrual periods for 12 months. It is important to talk with your health care provider about some of the most common conditions that affect women after menopause (postmenopausal women). These include heart disease, cancer, and bone loss (osteoporosis). Adopting a healthy lifestyle and getting preventive care can help to promote your health and wellness. The actions you take can also lower your chances of developing some of these common conditions. What should I know about menopause? During menopause, you may get a number of symptoms, such as:  Hot flashes. These can be moderate or severe.  Night sweats.  Decrease in sex drive.  Mood swings.  Headaches.  Tiredness.  Irritability.  Memory problems.  Insomnia. Choosing to treat or not to treat these symptoms is a decision that you make with your health care provider. Do I need hormone replacement therapy?  Hormone replacement therapy is effective in treating symptoms that are caused by menopause, such as hot flashes and night sweats.  Hormone replacement carries certain risks, especially as you become older. If you are thinking about using estrogen or estrogen with progestin, discuss the benefits and risks with your health care provider. What is my risk for heart disease and stroke? The risk of heart disease, heart attack, and stroke increases as you age. One of the causes may be a change in the body's hormones during menopause. This can affect how your body uses dietary fats, triglycerides, and cholesterol. Heart attack and stroke are medical emergencies. There are many things that you can do to help prevent heart disease and stroke. Watch  your blood pressure  High blood pressure causes heart disease and increases the risk of stroke. This is more likely to develop in people who have high blood pressure readings, are of African descent, or are overweight.  Have your blood pressure checked: ? Every 3-5 years if you are 85-99 years of age. ? Every year if you are 41 years old or older. Eat a healthy diet   Eat a diet that includes plenty of vegetables, fruits, low-fat dairy products, and lean protein.  Do not eat a lot of foods that are high in solid fats, added sugars, or sodium. Get regular exercise Get regular exercise. This is one of the most important things you can do for your health. Most adults should:  Try to exercise for at least 150 minutes each week. The exercise should increase your heart rate and make you sweat (moderate-intensity exercise).  Try to do strengthening exercises at least twice each week. Do these in addition to the moderate-intensity exercise.  Spend less time sitting. Even light physical activity can be beneficial. Other tips  Work with your health care provider to achieve or maintain a healthy weight.  Do not use any products that contain nicotine or tobacco, such as cigarettes, e-cigarettes, and chewing tobacco. If you need help quitting, ask your health care provider.  Know your numbers. Ask your health care provider to check your cholesterol and your blood sugar (glucose). Continue to have your blood tested as directed by your health care provider. Do I need screening for cancer? Depending on your health history and family history, you may need to have cancer screening at different  stages of your life. This may include screening for:  Breast cancer.  Cervical cancer.  Lung cancer.  Colorectal cancer. What is my risk for osteoporosis? After menopause, you may be at increased risk for osteoporosis. Osteoporosis is a condition in which bone destruction happens more quickly than new bone  creation. To help prevent osteoporosis or the bone fractures that can happen because of osteoporosis, you may take the following actions:  If you are 16-65 years old, get at least 1,000 mg of calcium and at least 600 mg of vitamin D per day.  If you are older than age 25 but younger than age 71, get at least 1,200 mg of calcium and at least 600 mg of vitamin D per day.  If you are older than age 61, get at least 1,200 mg of calcium and at least 800 mg of vitamin D per day. Smoking and drinking excessive alcohol increase the risk of osteoporosis. Eat foods that are rich in calcium and vitamin D, and do weight-bearing exercises several times each week as directed by your health care provider. How does menopause affect my mental health? Depression may occur at any age, but it is more common as you become older. Common symptoms of depression include:  Low or sad mood.  Changes in sleep patterns.  Changes in appetite or eating patterns.  Feeling an overall lack of motivation or enjoyment of activities that you previously enjoyed.  Frequent crying spells. Talk with your health care provider if you think that you are experiencing depression. General instructions See your health care provider for regular wellness exams and vaccines. This may include:  Scheduling regular health, dental, and eye exams.  Getting and maintaining your vaccines. These include: ? Influenza vaccine. Get this vaccine each year before the flu season begins. ? Pneumonia vaccine. ? Shingles vaccine. ? Tetanus, diphtheria, and pertussis (Tdap) booster vaccine. Your health care provider may also recommend other immunizations. Tell your health care provider if you have ever been abused or do not feel safe at home. Summary  Menopause is a normal process in which your ability to get pregnant comes to an end.  This condition causes hot flashes, night sweats, decreased interest in sex, mood swings, headaches, or lack of  sleep.  Treatment for this condition may include hormone replacement therapy.  Take actions to keep yourself healthy, including exercising regularly, eating a healthy diet, watching your weight, and checking your blood pressure and blood sugar levels.  Get screened for cancer and depression. Make sure that you are up to date with all your vaccines. This information is not intended to replace advice given to you by your health care provider. Make sure you discuss any questions you have with your health care provider. Document Released: 10/09/2005 Document Revised: 08/10/2018 Document Reviewed: 08/10/2018 Elsevier Patient Education  2020 [employer].

## 2019-03-07 NOTE — Progress Notes (Signed)
[name] April 01, 1956 [id number]    History:    Presents for annual exam. Postmenopausal on no HRT with no bleeding. Normal pap and mammogram history. Last mammogram 24-Nov-2010. 11-24-15 BSO for persistent benign 3 to 4 cm cyst. Sister died of ovarian cancer 24-Nov-2014. November 24, 2006 high grade dysplastic polyp-costomy, reversed in November 24, 2007, benign polyp colonoscopy 11/24/10. 07/2016 rectal cancer loop ileostomy, reversed 10/2016, has had chronic diarrhea almost daily since. Chronic diarrhea-managed by GI with colonoscopy every 18 months. 2017-11-23 colonoscopy showed benign polyps. 11/23/12 DEXA T-score -1.8 at femoral neck FRAX 6.9%/0.57%.   Past medical history, past surgical history, family history and social history were all reviewed and documented in the EPIC chart. Works as Air traffic controller. Husband, 3 children, and grandchildren are doing well. Daughter, [name], has Mediterranean fever. Multiple family members have GI issues, including Crohn's and Ulcerative Colitis.   ROS:  A ROS was performed and pertinent positives and negatives are included.  Exam:  Vitals:   03/07/19 0900  BP: 124/80  Weight: 215 lb (97.5 kg)  Height: 5\' 2"  (1.575 m)   Body mass index is 39.32 kg/m.   General appearance:  Normal Thyroid:  Symmetrical, normal in size, without palpable masses or nodularity. Respiratory  Auscultation:  Clear without wheezing or rhonchi Cardiovascular  Auscultation:  Regular rate, without rubs, murmurs or gallops  Edema/varicosities:  Not grossly evident Abdominal  Soft,nontender, without masses, guarding or rebound.  Liver/spleen:  No organomegaly noted  Hernia:  None appreciated  Skin  Inspection:  Grossly normal   Breasts: Examined lying and sitting.     Right: Without masses, retractions, discharge or axillary adenopathy.     Left: Without masses, retractions, discharge or axillary adenopathy. Gentitourinary   Inguinal/mons:  Normal without inguinal adenopathy  External genitalia:   Normal  BUS/Urethra/Skene's glands:  Normal  Vagina:  Normal  Cervix:  Normal  Uterus:  normal in size, shape and contour.  Midline and mobile  Adnexa/parametria:     Rt: Without masses or tenderness.   Lt: Without masses or tenderness.  Anus and perineum: Normal  Assessment/Plan:  63 y.o.  MWF G3P3 for annual exam with chronic diarrhea.  Chronic Diarrhea-managed by GI, colonoscopy every 18 months Postmenopausal on no HRT with no bleeding Nov 24, 2015 BSO, family history of ovarian cancer  Osteopenia without elevated FRAX Obesity  Plan: Overdue for mammogram, aware of importance. Breast center information given instructed to schedule mammogram and DEXA. Continue chronic diarrhea management with GI. Encouraged continued SBE's, Vitamin D 2000 mg, exercise, and healthy eating habits. 11/23/2017 Pap with negative HR HPV testing, new screening guidelines reviewed. CBC, CMP, UA, Lipid Panel, TSH.     [place], 9:55 AM 03/07/2019

## 2019-03-08 ENCOUNTER — Encounter: Payer: Self-pay | Admitting: Women's Health

## 2019-03-08 LAB — CBC WITH DIFFERENTIAL/PLATELET
Absolute Monocytes: 594 cells/uL (ref 200–950)
Basophils Absolute: 53 cells/uL (ref 0–200)
Basophils Relative: 0.8 %
Eosinophils Absolute: 139 cells/uL (ref 15–500)
Eosinophils Relative: 2.1 %
HCT: 40.3 % (ref 35.0–45.0)
Hemoglobin: 13.2 g/dL (ref 11.7–15.5)
Lymphs Abs: 2231 cells/uL (ref 850–3900)
MCH: 28.7 pg (ref 27.0–33.0)
MCHC: 32.8 g/dL (ref 32.0–36.0)
MCV: 87.6 fL (ref 80.0–100.0)
MPV: 9.2 fL (ref 7.5–12.5)
Monocytes Relative: 9 %
Neutro Abs: 3584 cells/uL (ref 1500–7800)
Neutrophils Relative %: 54.3 %
Platelets: 293 10*3/uL (ref 140–400)
RBC: 4.6 10*6/uL (ref 3.80–5.10)
RDW: 13.1 % (ref 11.0–15.0)
Total Lymphocyte: 33.8 %
WBC: 6.6 10*3/uL (ref 3.8–10.8)

## 2019-03-08 LAB — COMPREHENSIVE METABOLIC PANEL
AG Ratio: 1.6 (calc) (ref 1.0–2.5)
ALT: 11 U/L (ref 6–29)
AST: 14 U/L (ref 10–35)
Albumin: 4.2 g/dL (ref 3.6–5.1)
Alkaline phosphatase (APISO): 70 U/L (ref 37–153)
BUN: 16 mg/dL (ref 7–25)
CO2: 29 mmol/L (ref 20–32)
Calcium: 9.4 mg/dL (ref 8.6–10.4)
Chloride: 107 mmol/L (ref 98–110)
Creat: 0.59 mg/dL (ref 0.50–0.99)
Globulin: 2.7 g/dL (calc) (ref 1.9–3.7)
Glucose, Bld: 90 mg/dL (ref 65–99)
Potassium: 4 mmol/L (ref 3.5–5.3)
Sodium: 141 mmol/L (ref 135–146)
Total Bilirubin: 0.9 mg/dL (ref 0.2–1.2)
Total Protein: 6.9 g/dL (ref 6.1–8.1)

## 2019-03-08 LAB — LIPID PANEL
Cholesterol: 182 mg/dL (ref <200)
HDL: 55 mg/dL (ref >=50)
LDL Cholesterol (Calc): 106 mg/dL (calc) — ABNORMAL HIGH
Non-HDL Cholesterol (Calc): 127 mg/dL (calc) (ref <130)
Total CHOL/HDL Ratio: 3.3 (calc) (ref <5.0)
Triglycerides: 116 mg/dL (ref <150)

## 2019-03-08 LAB — TSH: TSH: 1.51 mIU/L (ref 0.40–4.50)

## 2019-03-09 LAB — URINE CULTURE
MICRO NUMBER:: 645549
SPECIMEN QUALITY:: ADEQUATE

## 2019-03-09 LAB — CULTURE INDICATED

## 2019-03-09 LAB — URINALYSIS, COMPLETE W/RFL CULTURE
Bacteria, UA: NONE SEEN /HPF
Bilirubin Urine: NEGATIVE
Glucose, UA: NEGATIVE
Hyaline Cast: NONE SEEN /LPF
Ketones, ur: NEGATIVE
Nitrites, Initial: NEGATIVE
Protein, ur: NEGATIVE
Specific Gravity, Urine: 1.014 (ref 1.001–1.03)
Squamous Epithelial / HPF: NONE SEEN /HPF (ref <=5)
WBC, UA: NONE SEEN /HPF (ref 0–5)
pH: 6.5 (ref 5.0–8.0)

## 2019-03-22 NOTE — Telephone Encounter (Signed)
Called patient and per DPR access note on file I left detailed message reading her the result note from [place].

## 2019-10-30 ENCOUNTER — Telehealth: Payer: Self-pay | Admitting: *Deleted

## 2019-10-30 MED ORDER — SULFAMETHOXAZOLE-TRIMETHOPRIM 800-160 MG PO TABS: 1.0000 | ORAL_TABLET | Freq: Two times a day (BID) | ORAL | 0 refills | Status: DC

## 2019-10-30 NOTE — Telephone Encounter (Signed)
Okay, let her know we are thinking of her hopefully she is not going to get too sick with Covid.  Septra twice daily for 3 days have her call if symptoms persist

## 2019-10-30 NOTE — Telephone Encounter (Signed)
Patient called c/o UTI requesting Rx, c/o burning with urination and frequent urination. Patient did test positive for COVID so not able to come in. Please advise

## 2019-10-30 NOTE — Telephone Encounter (Signed)
Patient informed, Rx sent.  

## 2019-10-31 ENCOUNTER — Telehealth: Payer: Self-pay | Admitting: *Deleted

## 2019-10-31 NOTE — Telephone Encounter (Signed)
Patient called was treated via phone for UTI Bactrim bid x 3 days, took 1 dose yesterday and reports being very hyper. Report no sleep last night, was not able to sit still. She asked if best to cut pills in half? Any recommendations? Report the burning with urination has stopped. Please advise

## 2019-10-31 NOTE — Telephone Encounter (Signed)
Please call and review positive reaction but glad symptoms are better, cut in half and if symptoms still persist stop med

## 2019-10-31 NOTE — Telephone Encounter (Signed)
Patient informed. 

## 2019-11-02 DIAGNOSIS — J9601 Acute respiratory failure with hypoxia: Secondary | ICD-10-CM

## 2019-11-02 DIAGNOSIS — U071 COVID-19: Secondary | ICD-10-CM

## 2020-11-15 DIAGNOSIS — Z85038 Personal history of other malignant neoplasm of large intestine: Secondary | ICD-10-CM | POA: Insufficient documentation

## 2020-11-15 DIAGNOSIS — K219 Gastro-esophageal reflux disease without esophagitis: Secondary | ICD-10-CM | POA: Insufficient documentation

## 2020-11-29 DIAGNOSIS — M419 Scoliosis, unspecified: Secondary | ICD-10-CM | POA: Diagnosis not present

## 2020-11-29 DIAGNOSIS — M4856XA Collapsed vertebra, not elsewhere classified, lumbar region, initial encounter for fracture: Secondary | ICD-10-CM | POA: Diagnosis not present

## 2020-11-29 DIAGNOSIS — M47817 Spondylosis without myelopathy or radiculopathy, lumbosacral region: Secondary | ICD-10-CM | POA: Diagnosis not present

## 2020-11-29 DIAGNOSIS — M4319 Spondylolisthesis, multiple sites in spine: Secondary | ICD-10-CM | POA: Diagnosis not present

## 2020-11-29 DIAGNOSIS — M5136 Other intervertebral disc degeneration, lumbar region: Secondary | ICD-10-CM | POA: Diagnosis not present

## 2020-11-29 DIAGNOSIS — M47816 Spondylosis without myelopathy or radiculopathy, lumbar region: Secondary | ICD-10-CM | POA: Diagnosis not present

## 2020-12-16 DIAGNOSIS — Z5181 Encounter for therapeutic drug level monitoring: Secondary | ICD-10-CM | POA: Diagnosis not present

## 2020-12-16 DIAGNOSIS — G8929 Other chronic pain: Secondary | ICD-10-CM | POA: Diagnosis not present

## 2020-12-16 DIAGNOSIS — M5416 Radiculopathy, lumbar region: Secondary | ICD-10-CM | POA: Diagnosis not present

## 2020-12-16 DIAGNOSIS — Z79899 Other long term (current) drug therapy: Secondary | ICD-10-CM | POA: Diagnosis not present

## 2020-12-16 DIAGNOSIS — M5442 Lumbago with sciatica, left side: Secondary | ICD-10-CM | POA: Diagnosis not present

## 2021-01-02 DIAGNOSIS — M5416 Radiculopathy, lumbar region: Secondary | ICD-10-CM | POA: Diagnosis not present

## 2021-01-15 DIAGNOSIS — Z0000 Encounter for general adult medical examination without abnormal findings: Secondary | ICD-10-CM | POA: Diagnosis not present

## 2021-01-15 DIAGNOSIS — M5442 Lumbago with sciatica, left side: Secondary | ICD-10-CM | POA: Diagnosis not present

## 2021-01-15 DIAGNOSIS — Z23 Encounter for immunization: Secondary | ICD-10-CM | POA: Diagnosis not present

## 2021-01-15 DIAGNOSIS — K529 Noninfective gastroenteritis and colitis, unspecified: Secondary | ICD-10-CM | POA: Diagnosis not present

## 2021-01-15 DIAGNOSIS — C20 Malignant neoplasm of rectum: Secondary | ICD-10-CM | POA: Diagnosis not present

## 2021-01-15 DIAGNOSIS — G8929 Other chronic pain: Secondary | ICD-10-CM | POA: Diagnosis not present

## 2021-01-21 DIAGNOSIS — Z1231 Encounter for screening mammogram for malignant neoplasm of breast: Secondary | ICD-10-CM | POA: Diagnosis not present

## 2021-02-08 DIAGNOSIS — H00011 Hordeolum externum right upper eyelid: Secondary | ICD-10-CM | POA: Diagnosis not present

## 2021-03-27 DIAGNOSIS — M5416 Radiculopathy, lumbar region: Secondary | ICD-10-CM | POA: Diagnosis not present

## 2021-03-27 DIAGNOSIS — M5442 Lumbago with sciatica, left side: Secondary | ICD-10-CM | POA: Diagnosis not present

## 2021-03-27 DIAGNOSIS — G8929 Other chronic pain: Secondary | ICD-10-CM | POA: Diagnosis not present

## 2021-04-22 DIAGNOSIS — M5412 Radiculopathy, cervical region: Secondary | ICD-10-CM | POA: Diagnosis not present

## 2021-04-30 DIAGNOSIS — G894 Chronic pain syndrome: Secondary | ICD-10-CM | POA: Diagnosis not present

## 2021-04-30 DIAGNOSIS — M5412 Radiculopathy, cervical region: Secondary | ICD-10-CM | POA: Diagnosis not present

## 2021-04-30 DIAGNOSIS — M533 Sacrococcygeal disorders, not elsewhere classified: Secondary | ICD-10-CM | POA: Diagnosis not present

## 2021-05-01 DIAGNOSIS — N390 Urinary tract infection, site not specified: Secondary | ICD-10-CM | POA: Diagnosis not present

## 2021-05-28 DIAGNOSIS — G894 Chronic pain syndrome: Secondary | ICD-10-CM | POA: Diagnosis not present

## 2021-05-28 DIAGNOSIS — M533 Sacrococcygeal disorders, not elsewhere classified: Secondary | ICD-10-CM | POA: Diagnosis not present

## 2021-06-06 DIAGNOSIS — M533 Sacrococcygeal disorders, not elsewhere classified: Secondary | ICD-10-CM | POA: Diagnosis not present

## 2021-07-10 DIAGNOSIS — M9904 Segmental and somatic dysfunction of sacral region: Secondary | ICD-10-CM | POA: Diagnosis not present

## 2021-07-10 DIAGNOSIS — M4728 Other spondylosis with radiculopathy, sacral and sacrococcygeal region: Secondary | ICD-10-CM | POA: Diagnosis not present

## 2021-07-10 DIAGNOSIS — M5442 Lumbago with sciatica, left side: Secondary | ICD-10-CM | POA: Diagnosis not present

## 2021-07-10 DIAGNOSIS — M9903 Segmental and somatic dysfunction of lumbar region: Secondary | ICD-10-CM | POA: Diagnosis not present

## 2021-07-14 DIAGNOSIS — M25561 Pain in right knee: Secondary | ICD-10-CM | POA: Diagnosis not present

## 2021-07-14 DIAGNOSIS — M4728 Other spondylosis with radiculopathy, sacral and sacrococcygeal region: Secondary | ICD-10-CM | POA: Diagnosis not present

## 2021-07-14 DIAGNOSIS — M25562 Pain in left knee: Secondary | ICD-10-CM | POA: Diagnosis not present

## 2021-07-14 DIAGNOSIS — M5442 Lumbago with sciatica, left side: Secondary | ICD-10-CM | POA: Diagnosis not present

## 2021-07-15 DIAGNOSIS — M25561 Pain in right knee: Secondary | ICD-10-CM | POA: Diagnosis not present

## 2021-07-15 DIAGNOSIS — M25562 Pain in left knee: Secondary | ICD-10-CM | POA: Diagnosis not present

## 2021-07-15 DIAGNOSIS — M5442 Lumbago with sciatica, left side: Secondary | ICD-10-CM | POA: Diagnosis not present

## 2021-07-15 DIAGNOSIS — M4728 Other spondylosis with radiculopathy, sacral and sacrococcygeal region: Secondary | ICD-10-CM | POA: Diagnosis not present

## 2021-07-16 DIAGNOSIS — M5442 Lumbago with sciatica, left side: Secondary | ICD-10-CM | POA: Diagnosis not present

## 2021-07-16 DIAGNOSIS — M25562 Pain in left knee: Secondary | ICD-10-CM | POA: Diagnosis not present

## 2021-07-16 DIAGNOSIS — M25561 Pain in right knee: Secondary | ICD-10-CM | POA: Diagnosis not present

## 2021-07-16 DIAGNOSIS — M4728 Other spondylosis with radiculopathy, sacral and sacrococcygeal region: Secondary | ICD-10-CM | POA: Diagnosis not present

## 2021-07-21 DIAGNOSIS — M25562 Pain in left knee: Secondary | ICD-10-CM | POA: Diagnosis not present

## 2021-07-21 DIAGNOSIS — M4728 Other spondylosis with radiculopathy, sacral and sacrococcygeal region: Secondary | ICD-10-CM | POA: Diagnosis not present

## 2021-07-21 DIAGNOSIS — M25561 Pain in right knee: Secondary | ICD-10-CM | POA: Diagnosis not present

## 2021-07-21 DIAGNOSIS — M5442 Lumbago with sciatica, left side: Secondary | ICD-10-CM | POA: Diagnosis not present

## 2021-07-22 DIAGNOSIS — M5442 Lumbago with sciatica, left side: Secondary | ICD-10-CM | POA: Diagnosis not present

## 2021-07-22 DIAGNOSIS — M4728 Other spondylosis with radiculopathy, sacral and sacrococcygeal region: Secondary | ICD-10-CM | POA: Diagnosis not present

## 2021-07-22 DIAGNOSIS — M25562 Pain in left knee: Secondary | ICD-10-CM | POA: Diagnosis not present

## 2021-07-22 DIAGNOSIS — M25561 Pain in right knee: Secondary | ICD-10-CM | POA: Diagnosis not present

## 2021-07-28 DIAGNOSIS — M5442 Lumbago with sciatica, left side: Secondary | ICD-10-CM | POA: Diagnosis not present

## 2021-07-28 DIAGNOSIS — M25561 Pain in right knee: Secondary | ICD-10-CM | POA: Diagnosis not present

## 2021-07-28 DIAGNOSIS — M25562 Pain in left knee: Secondary | ICD-10-CM | POA: Diagnosis not present

## 2021-07-28 DIAGNOSIS — M4728 Other spondylosis with radiculopathy, sacral and sacrococcygeal region: Secondary | ICD-10-CM | POA: Diagnosis not present

## 2021-07-30 DIAGNOSIS — M25561 Pain in right knee: Secondary | ICD-10-CM | POA: Diagnosis not present

## 2021-07-30 DIAGNOSIS — M25562 Pain in left knee: Secondary | ICD-10-CM | POA: Diagnosis not present

## 2021-07-30 DIAGNOSIS — M5442 Lumbago with sciatica, left side: Secondary | ICD-10-CM | POA: Diagnosis not present

## 2021-07-30 DIAGNOSIS — M4728 Other spondylosis with radiculopathy, sacral and sacrococcygeal region: Secondary | ICD-10-CM | POA: Diagnosis not present

## 2021-07-31 DIAGNOSIS — M4728 Other spondylosis with radiculopathy, sacral and sacrococcygeal region: Secondary | ICD-10-CM | POA: Diagnosis not present

## 2021-07-31 DIAGNOSIS — M25561 Pain in right knee: Secondary | ICD-10-CM | POA: Diagnosis not present

## 2021-07-31 DIAGNOSIS — M5442 Lumbago with sciatica, left side: Secondary | ICD-10-CM | POA: Diagnosis not present

## 2021-07-31 DIAGNOSIS — M25562 Pain in left knee: Secondary | ICD-10-CM | POA: Diagnosis not present

## 2021-08-05 DIAGNOSIS — M25561 Pain in right knee: Secondary | ICD-10-CM | POA: Diagnosis not present

## 2021-08-05 DIAGNOSIS — M5442 Lumbago with sciatica, left side: Secondary | ICD-10-CM | POA: Diagnosis not present

## 2021-08-05 DIAGNOSIS — M25562 Pain in left knee: Secondary | ICD-10-CM | POA: Diagnosis not present

## 2021-08-05 DIAGNOSIS — M4728 Other spondylosis with radiculopathy, sacral and sacrococcygeal region: Secondary | ICD-10-CM | POA: Diagnosis not present

## 2021-08-06 DIAGNOSIS — M4728 Other spondylosis with radiculopathy, sacral and sacrococcygeal region: Secondary | ICD-10-CM | POA: Diagnosis not present

## 2021-08-06 DIAGNOSIS — M25562 Pain in left knee: Secondary | ICD-10-CM | POA: Diagnosis not present

## 2021-08-06 DIAGNOSIS — M25561 Pain in right knee: Secondary | ICD-10-CM | POA: Diagnosis not present

## 2021-08-06 DIAGNOSIS — M5442 Lumbago with sciatica, left side: Secondary | ICD-10-CM | POA: Diagnosis not present

## 2021-08-07 DIAGNOSIS — M25562 Pain in left knee: Secondary | ICD-10-CM | POA: Diagnosis not present

## 2021-08-07 DIAGNOSIS — M25561 Pain in right knee: Secondary | ICD-10-CM | POA: Diagnosis not present

## 2021-08-07 DIAGNOSIS — M4728 Other spondylosis with radiculopathy, sacral and sacrococcygeal region: Secondary | ICD-10-CM | POA: Diagnosis not present

## 2021-08-07 DIAGNOSIS — M5442 Lumbago with sciatica, left side: Secondary | ICD-10-CM | POA: Diagnosis not present

## 2021-08-12 DIAGNOSIS — M4728 Other spondylosis with radiculopathy, sacral and sacrococcygeal region: Secondary | ICD-10-CM | POA: Diagnosis not present

## 2021-08-12 DIAGNOSIS — M5442 Lumbago with sciatica, left side: Secondary | ICD-10-CM | POA: Diagnosis not present

## 2021-08-12 DIAGNOSIS — M25561 Pain in right knee: Secondary | ICD-10-CM | POA: Diagnosis not present

## 2021-08-12 DIAGNOSIS — M25562 Pain in left knee: Secondary | ICD-10-CM | POA: Diagnosis not present

## 2021-08-14 DIAGNOSIS — M9904 Segmental and somatic dysfunction of sacral region: Secondary | ICD-10-CM | POA: Diagnosis not present

## 2021-08-14 DIAGNOSIS — M4728 Other spondylosis with radiculopathy, sacral and sacrococcygeal region: Secondary | ICD-10-CM | POA: Diagnosis not present

## 2021-08-14 DIAGNOSIS — M25561 Pain in right knee: Secondary | ICD-10-CM | POA: Diagnosis not present

## 2021-08-14 DIAGNOSIS — M5442 Lumbago with sciatica, left side: Secondary | ICD-10-CM | POA: Diagnosis not present

## 2021-08-14 DIAGNOSIS — M9906 Segmental and somatic dysfunction of lower extremity: Secondary | ICD-10-CM | POA: Diagnosis not present

## 2021-08-14 DIAGNOSIS — M9903 Segmental and somatic dysfunction of lumbar region: Secondary | ICD-10-CM | POA: Diagnosis not present

## 2021-08-14 DIAGNOSIS — M25562 Pain in left knee: Secondary | ICD-10-CM | POA: Diagnosis not present

## 2022-05-11 DIAGNOSIS — L720 Epidermal cyst: Secondary | ICD-10-CM | POA: Diagnosis not present

## 2022-05-11 DIAGNOSIS — D692 Other nonthrombocytopenic purpura: Secondary | ICD-10-CM | POA: Diagnosis not present

## 2022-05-11 DIAGNOSIS — Z129 Encounter for screening for malignant neoplasm, site unspecified: Secondary | ICD-10-CM | POA: Diagnosis not present

## 2022-05-11 DIAGNOSIS — L821 Other seborrheic keratosis: Secondary | ICD-10-CM | POA: Diagnosis not present

## 2022-06-15 DIAGNOSIS — H35373 Puckering of macula, bilateral: Secondary | ICD-10-CM | POA: Diagnosis not present

## 2022-06-15 DIAGNOSIS — H25813 Combined forms of age-related cataract, bilateral: Secondary | ICD-10-CM | POA: Diagnosis not present

## 2022-06-15 DIAGNOSIS — H43813 Vitreous degeneration, bilateral: Secondary | ICD-10-CM | POA: Diagnosis not present

## 2022-09-08 DIAGNOSIS — K219 Gastro-esophageal reflux disease without esophagitis: Secondary | ICD-10-CM | POA: Diagnosis not present

## 2022-09-08 DIAGNOSIS — Z9049 Acquired absence of other specified parts of digestive tract: Secondary | ICD-10-CM | POA: Diagnosis not present

## 2022-09-08 DIAGNOSIS — H02834 Dermatochalasis of left upper eyelid: Secondary | ICD-10-CM | POA: Diagnosis not present

## 2022-09-08 DIAGNOSIS — H02831 Dermatochalasis of right upper eyelid: Secondary | ICD-10-CM | POA: Diagnosis not present

## 2022-09-08 DIAGNOSIS — E669 Obesity, unspecified: Secondary | ICD-10-CM | POA: Diagnosis not present

## 2022-09-08 DIAGNOSIS — H25811 Combined forms of age-related cataract, right eye: Secondary | ICD-10-CM | POA: Insufficient documentation

## 2022-09-08 DIAGNOSIS — Z6835 Body mass index (BMI) 35.0-35.9, adult: Secondary | ICD-10-CM | POA: Diagnosis not present

## 2022-09-08 DIAGNOSIS — Z881 Allergy status to other antibiotic agents status: Secondary | ICD-10-CM | POA: Diagnosis not present

## 2022-09-08 DIAGNOSIS — H43813 Vitreous degeneration, bilateral: Secondary | ICD-10-CM | POA: Diagnosis not present

## 2022-09-08 DIAGNOSIS — H25813 Combined forms of age-related cataract, bilateral: Secondary | ICD-10-CM | POA: Diagnosis not present

## 2022-09-08 DIAGNOSIS — H35373 Puckering of macula, bilateral: Secondary | ICD-10-CM | POA: Diagnosis not present

## 2022-09-15 DIAGNOSIS — H02834 Dermatochalasis of left upper eyelid: Secondary | ICD-10-CM | POA: Diagnosis not present

## 2022-09-15 DIAGNOSIS — Z85038 Personal history of other malignant neoplasm of large intestine: Secondary | ICD-10-CM | POA: Diagnosis not present

## 2022-09-15 DIAGNOSIS — Z881 Allergy status to other antibiotic agents status: Secondary | ICD-10-CM | POA: Diagnosis not present

## 2022-09-15 DIAGNOSIS — H25812 Combined forms of age-related cataract, left eye: Secondary | ICD-10-CM | POA: Insufficient documentation

## 2022-09-15 DIAGNOSIS — H25813 Combined forms of age-related cataract, bilateral: Secondary | ICD-10-CM | POA: Diagnosis not present

## 2022-09-15 DIAGNOSIS — H35373 Puckering of macula, bilateral: Secondary | ICD-10-CM | POA: Diagnosis not present

## 2022-09-15 DIAGNOSIS — Z6835 Body mass index (BMI) 35.0-35.9, adult: Secondary | ICD-10-CM | POA: Diagnosis not present

## 2022-09-15 DIAGNOSIS — H02831 Dermatochalasis of right upper eyelid: Secondary | ICD-10-CM | POA: Diagnosis not present

## 2022-09-15 DIAGNOSIS — E669 Obesity, unspecified: Secondary | ICD-10-CM | POA: Diagnosis not present

## 2022-09-15 DIAGNOSIS — H43813 Vitreous degeneration, bilateral: Secondary | ICD-10-CM | POA: Diagnosis not present

## 2022-09-21 DIAGNOSIS — R197 Diarrhea, unspecified: Secondary | ICD-10-CM | POA: Diagnosis not present

## 2022-09-21 DIAGNOSIS — K5900 Constipation, unspecified: Secondary | ICD-10-CM | POA: Diagnosis not present

## 2022-09-21 DIAGNOSIS — K6289 Other specified diseases of anus and rectum: Secondary | ICD-10-CM | POA: Diagnosis not present

## 2022-09-24 DIAGNOSIS — Z85038 Personal history of other malignant neoplasm of large intestine: Secondary | ICD-10-CM | POA: Diagnosis not present

## 2022-09-24 DIAGNOSIS — K6289 Other specified diseases of anus and rectum: Secondary | ICD-10-CM | POA: Diagnosis not present

## 2022-09-24 DIAGNOSIS — K5909 Other constipation: Secondary | ICD-10-CM | POA: Diagnosis not present

## 2022-10-06 DIAGNOSIS — R300 Dysuria: Secondary | ICD-10-CM | POA: Diagnosis not present

## 2022-10-06 DIAGNOSIS — Z1330 Encounter for screening examination for mental health and behavioral disorders, unspecified: Secondary | ICD-10-CM | POA: Diagnosis not present

## 2022-10-06 DIAGNOSIS — K6289 Other specified diseases of anus and rectum: Secondary | ICD-10-CM | POA: Diagnosis not present

## 2022-10-06 DIAGNOSIS — C20 Malignant neoplasm of rectum: Secondary | ICD-10-CM | POA: Diagnosis not present

## 2022-10-06 DIAGNOSIS — Z5181 Encounter for therapeutic drug level monitoring: Secondary | ICD-10-CM | POA: Diagnosis not present

## 2022-10-08 DIAGNOSIS — H25811 Combined forms of age-related cataract, right eye: Secondary | ICD-10-CM | POA: Diagnosis not present

## 2022-10-20 DIAGNOSIS — R300 Dysuria: Secondary | ICD-10-CM | POA: Diagnosis not present

## 2022-10-20 DIAGNOSIS — B3731 Acute candidiasis of vulva and vagina: Secondary | ICD-10-CM | POA: Diagnosis not present

## 2022-11-16 ENCOUNTER — Encounter: Payer: Self-pay | Admitting: Family Medicine

## 2022-11-16 ENCOUNTER — Ambulatory Visit: Payer: BC Managed Care – PPO | Admitting: Family Medicine

## 2022-11-16 VITALS — BP 138/69 | HR 63 | Temp 98.2°F | Resp 16 | Ht 61.5 in | Wt 190.0 lb

## 2022-11-16 DIAGNOSIS — E669 Obesity, unspecified: Secondary | ICD-10-CM | POA: Diagnosis not present

## 2022-11-16 DIAGNOSIS — Z1159 Encounter for screening for other viral diseases: Secondary | ICD-10-CM | POA: Diagnosis not present

## 2022-11-16 DIAGNOSIS — Z6835 Body mass index (BMI) 35.0-35.9, adult: Secondary | ICD-10-CM

## 2022-11-16 DIAGNOSIS — M5442 Lumbago with sciatica, left side: Secondary | ICD-10-CM | POA: Diagnosis not present

## 2022-11-16 DIAGNOSIS — G8929 Other chronic pain: Secondary | ICD-10-CM

## 2022-11-16 LAB — CBC WITH DIFFERENTIAL/PLATELET
Basophils Absolute: 0.1 10*3/uL (ref 0.0–0.1)
Basophils Relative: 0.6 % (ref 0.0–3.0)
Eosinophils Absolute: 0.2 10*3/uL (ref 0.0–0.7)
Eosinophils Relative: 1.5 % (ref 0.0–5.0)
HCT: 38.2 % (ref 36.0–46.0)
Hemoglobin: 12.6 g/dL (ref 12.0–15.0)
Lymphocytes Relative: 27.3 % (ref 12.0–46.0)
Lymphs Abs: 2.7 10*3/uL (ref 0.7–4.0)
MCHC: 33 g/dL (ref 30.0–36.0)
MCV: 88.7 fl (ref 78.0–100.0)
Monocytes Absolute: 0.7 10*3/uL (ref 0.1–1.0)
Monocytes Relative: 7.5 % (ref 3.0–12.0)
Neutro Abs: 6.2 10*3/uL (ref 1.4–7.7)
Neutrophils Relative %: 63.1 % (ref 43.0–77.0)
Platelets: 340 10*3/uL (ref 150.0–400.0)
RBC: 4.31 Mil/uL (ref 3.87–5.11)
RDW: 13.6 % (ref 11.5–15.5)
WBC: 9.9 10*3/uL (ref 4.0–10.5)

## 2022-11-16 LAB — COMPREHENSIVE METABOLIC PANEL
ALT: 11 U/L (ref 0–35)
AST: 14 U/L (ref 0–37)
Albumin: 3.9 g/dL (ref 3.5–5.2)
Alkaline Phosphatase: 60 U/L (ref 39–117)
BUN: 27 mg/dL — ABNORMAL HIGH (ref 6–23)
CO2: 26 mEq/L (ref 19–32)
Calcium: 9.7 mg/dL (ref 8.4–10.5)
Chloride: 107 mEq/L (ref 96–112)
Creatinine, Ser: 0.69 mg/dL (ref 0.40–1.20)
GFR: 90.54 mL/min (ref >60.00)
Glucose, Bld: 86 mg/dL (ref 70–99)
Potassium: 3.8 mEq/L (ref 3.5–5.1)
Sodium: 143 mEq/L (ref 135–145)
Total Bilirubin: 0.5 mg/dL (ref 0.2–1.2)
Total Protein: 6.6 g/dL (ref 6.0–8.3)

## 2022-11-16 LAB — LIPID PANEL
Cholesterol: 181 mg/dL (ref 0–200)
HDL: 64.3 mg/dL (ref >39.00)
LDL Cholesterol: 97 mg/dL (ref 0–99)
NonHDL: 116.43
Total CHOL/HDL Ratio: 3
Triglycerides: 99 mg/dL (ref 0.0–149.0)
VLDL: 19.8 mg/dL (ref 0.0–40.0)

## 2022-11-16 LAB — TSH: TSH: 1.13 u[IU]/mL (ref 0.35–5.50)
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
## 2022-11-16 NOTE — Patient Instructions (Signed)
Thank you for choosing [facility] for your Primary Care needs. I am excited for the opportunity to partner with you to meet your health care goals. It was a pleasure meeting you today!  Information on diet, exercise, and health maintenance recommendations are listed below. This is information to help you be sure you are on track for optimal health and monitoring.   Please look over this and let us know if you have any questions or if you have completed any of the health maintenance outside of [facility] so that we can be sure your records are up to date.  ___________________________________________________________  MyChart:  For all urgent or time sensitive needs we ask that you please call the office to avoid delays. Our number is [phone]. MyChart is not constantly monitored and due to the large volume of messages a day, replies may take up to 72 business hours.  MyChart Policy: MyChart allows for you to see your visit notes, after visit summary, provider recommendations, lab and tests results, make an appointment, request refills, and contact your provider or the office for non-urgent questions or concerns. Providers are seeing patients during normal business hours and do not have built in time to review MyChart messages.  We ask that you allow a minimum of 3 business days for responses to MyChart messages. For this reason, please do not send urgent requests through MyChart. Please call the office at [phone]. New and ongoing conditions may require a visit. We have virtual and in-person visits available for your convenience.  Complex MyChart concerns may require a visit. Your provider may request you schedule a virtual or in-person visit to ensure we are providing the best care possible. MyChart messages sent after 11:00 AM on Friday will not be received by the provider until Monday morning.    Lab and Test Results: You will receive your lab and test  results on MyChart as soon as they are completed and results have been sent by the lab or testing facility. Due to this service, you will receive your results BEFORE your provider.  I review lab and test results each morning prior to seeing patients. Some results require collaboration with other providers to ensure you are receiving the most appropriate care. For this reason, we ask that you please allow a minimum of 3-5 business days from the time that ALL results have been received for your provider to receive and review lab and test results and contact you about these.  Most lab and test result comments from the provider will be sent through MyChart. Your provider may recommend changes to the plan of care, follow-up visits, repeat testing, ask questions, or request an office visit to discuss these results. You may reply directly to this message or call the office to provide information for the provider or set up an appointment. In some instances, you will be called with test results and recommendations. Please let us know if this is preferred and we will make note of this in your chart to provide this for you.    If you have not heard a response to your lab or test results in 5 business days from all results returning to MyChart, please call the office to let us know. We ask that you please avoid calling prior to this time unless there is an emergent concern. Due to high call volumes, this can delay the resulting process.  After Hours: For all non-emergency after hours needs, please   call the office at [phone] and select the option to reach the on-call  service. On-call services are shared between multiple [facility] offices and therefore it will not be possible to speak directly with your provider. On-call providers may provide medical advice and recommendations, but are unable to provide refills for maintenance medications.  For all emergency or urgent medical needs after normal business hours, we  recommend that you seek care at the closest Urgent Care or Emergency Department to ensure appropriate treatment in a timely manner.  [facility] has a 24 hour emergency room located on the ground floor for your convenience.   Urgent Concerns During the Business Day Providers are seeing patients from 8AM to 5PM with a busy schedule and are most often not able to respond to non-urgent calls until the end of the day or the next business day. If you should have URGENT concerns during the day, please call and speak to the nurse or schedule a same day appointment so that we can address your concern without delay.   Thank you, again, for choosing me as your health care partner. I appreciate your trust and look forward to learning more about you!   Asim Gersten B. Graceanne Guin, DNP, FNP-C  ___________________________________________________________  Health Maintenance Recommendations Screening Testing Mammogram Every 1-2 years based on history and risk factors Starting at age 50 Pap Smear Ages 21-39 every 3 years Ages 30-65 every 5 years with HPV testing More frequent testing may be required based on results and history Colon Cancer Screening Every 1-10 years based on test performed, risk factors, and history Starting at age 45 Bone Density Screening Every 2-10 years based on history Starting at age 65 for women Recommendations for men differ based on medication usage, history, and risk factors AAA Screening One time ultrasound Men 65-75 years old who have ever smoked Lung Cancer Screening Low Dose Lung CT every 12 months Age 50-80 years with a 20 pack-year smoking history who still smoke or who have quit within the last 15 years  Screening Labs Routine  Labs: Complete Blood Count (CBC), Complete Metabolic Panel (CMP), Cholesterol (Lipid Panel) Every 6-12 months based on history and medications May be recommended more frequently based on current conditions or previous results Hemoglobin  A1c Lab Every 3-12 months based on history and previous results Starting at age 45 or earlier with diagnosis of diabetes, high cholesterol, BMI >26, and/or risk factors Frequent monitoring for patients with diabetes to ensure blood sugar control Thyroid Panel  Every 6 months based on history, symptoms, and risk factors May be repeated more often if on medication HIV One time testing for all patients 13 and older May be repeated more frequently for patients with increased risk factors or exposure Hepatitis C One time testing for all patients 18 and older May be repeated more frequently for patients with increased risk factors or exposure Gonorrhea, Chlamydia Every 12 months for all sexually active persons 13-24 years Additional monitoring may be recommended for those who are considered high risk or who have symptoms PSA Men 40-54 years old with risk factors Additional screening may be recommended from age 55-69 based on risk factors, symptoms, and history  Vaccine Recommendations Tetanus Booster All adults every 10 years Flu Vaccine All patients 6 months and older every year COVID Vaccine All patients 12 years and older Initial dosing with booster May recommend additional booster based on age and health history HPV Vaccine 2 doses all patients age 9-26 Dosing may be considered   for patients over 26 Shingles Vaccine (Shingrix) 2 doses all adults 50 years and older Pneumonia (Pneumovax 23) All adults 65 years and older May recommend earlier dosing based on health history Pneumonia (Prevnar 13) All adults 65 years and older Dosed 1 year after Pneumovax 23 Pneumonia (Prevnar 20) All adults 65 years and older (adults 19-64 with certain conditions or risk factors) 1 dose  For those who have not received Prevnar 13 vaccine previously   Additional Screening, Testing, and Vaccinations may be recommended on an individualized basis based on family history, health history, risk  factors, and/or exposure.  __________________________________________________________  Diet Recommendations for All Patients  I recommend that all patients maintain a diet low in saturated fats, carbohydrates, and cholesterol. While this can be challenging at first, it is not impossible and small changes can make big differences.  Things to try: Decreasing the amount of soda, sweet tea, and/or juice to one or less per day and replace with water While water is always the first choice, if you do not like water you may consider adding a water additive without sugar to improve the taste other sugar free drinks Replace potatoes with a brightly colored vegetable  Use healthy oils, such as canola oil or olive oil, instead of butter or hard margarine Limit your bread intake to two pieces or less a day Replace regular pasta with low carb pasta options Bake, broil, or grill foods instead of frying Monitor portion sizes  Eat smaller, more frequent meals throughout the day instead of large meals  An important thing to remember is, if you love foods that are not great for your health, you don't have to give them up completely. Instead, allow these foods to be a reward when you have done well. Allowing yourself to still have special treats every once in a while is a nice way to tell yourself thank you for working hard to keep yourself healthy.   Also remember that every day is a new day. If you have a bad day and "fall off the wagon", you can still climb right back up and keep moving along on your journey!  We have resources available to help you!  Some websites that may be helpful include: www.MyPlate.gov  Www.VeryWellFit.com _____________________________________________________________  Activity Recommendations for All Patients  I recommend that all adults get at least 20 minutes of moderate physical activity that elevates your heart rate at least 5 days out of the week.  Some examples  include: Walking or jogging at a pace that allows you to carry on a conversation Cycling (stationary bike or outdoors) Water aerobics Yoga Weight lifting Dancing If physical limitations prevent you from putting stress on your joints, exercise in a pool or seated in a chair are excellent options.  Do determine your MAXIMUM heart rate for activity: 220 - YOUR AGE = MAX Heart Rate   Remember! Do not push yourself too hard.  Start slowly and build up your pace, speed, weight, time in exercise, etc.  Allow your body to rest between exercise and get good sleep. You will need more water than normal when you are exerting yourself. Do not wait until you are thirsty to drink. Drink with a purpose of getting in at least 8, 8 ounce glasses of water a day plus more depending on how much you exercise and sweat.    If you begin to develop dizziness, chest pain, abdominal pain, jaw pain, shortness of breath, headache, vision changes, lightheadedness, or other concerning symptoms,   stop the activity and allow your body to rest. If your symptoms are severe, seek emergency evaluation immediately. If your symptoms are concerning, but not severe, please let us know so that we can recommend further evaluation.     

## 2022-11-17 LAB — HEPATITIS C ANTIBODY: Hepatitis C Ab: NONREACTIVE

## 2022-11-23 ENCOUNTER — Ambulatory Visit: Payer: BC Managed Care – PPO | Attending: Family Medicine | Admitting: Physical Therapy

## 2022-11-23 ENCOUNTER — Other Ambulatory Visit: Payer: Self-pay

## 2022-11-23 ENCOUNTER — Encounter: Payer: Self-pay | Admitting: Physical Therapy

## 2022-11-23 DIAGNOSIS — M6281 Muscle weakness (generalized): Secondary | ICD-10-CM | POA: Insufficient documentation

## 2022-11-23 DIAGNOSIS — M79662 Pain in left lower leg: Secondary | ICD-10-CM | POA: Insufficient documentation

## 2022-11-23 DIAGNOSIS — M5442 Lumbago with sciatica, left side: Secondary | ICD-10-CM | POA: Diagnosis not present

## 2022-11-23 DIAGNOSIS — G8929 Other chronic pain: Secondary | ICD-10-CM | POA: Diagnosis not present

## 2022-11-23 DIAGNOSIS — M5459 Other low back pain: Secondary | ICD-10-CM | POA: Diagnosis not present

## 2022-11-23 DIAGNOSIS — R29898 Other symptoms and signs involving the musculoskeletal system: Secondary | ICD-10-CM | POA: Diagnosis not present
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
## 2022-11-30 ENCOUNTER — Ambulatory Visit: Payer: BC Managed Care – PPO

## 2022-12-02 ENCOUNTER — Encounter: Payer: Self-pay | Admitting: Physical Therapy

## 2022-12-02 ENCOUNTER — Ambulatory Visit: Payer: BC Managed Care – PPO | Attending: Family Medicine | Admitting: Physical Therapy

## 2022-12-02 DIAGNOSIS — R29898 Other symptoms and signs involving the musculoskeletal system: Secondary | ICD-10-CM | POA: Diagnosis not present

## 2022-12-02 DIAGNOSIS — M79662 Pain in left lower leg: Secondary | ICD-10-CM | POA: Diagnosis not present

## 2022-12-02 DIAGNOSIS — M6281 Muscle weakness (generalized): Secondary | ICD-10-CM | POA: Diagnosis not present

## 2022-12-02 DIAGNOSIS — M5459 Other low back pain: Secondary | ICD-10-CM | POA: Diagnosis not present
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
## 2022-12-04 ENCOUNTER — Ambulatory Visit: Payer: BC Managed Care – PPO

## 2022-12-04 DIAGNOSIS — M5459 Other low back pain: Secondary | ICD-10-CM

## 2022-12-04 DIAGNOSIS — M6281 Muscle weakness (generalized): Secondary | ICD-10-CM

## 2022-12-04 DIAGNOSIS — M79662 Pain in left lower leg: Secondary | ICD-10-CM

## 2022-12-04 DIAGNOSIS — R29898 Other symptoms and signs involving the musculoskeletal system: Secondary | ICD-10-CM | POA: Diagnosis not present
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
## 2022-12-09 ENCOUNTER — Encounter: Payer: Self-pay | Admitting: Nurse Practitioner

## 2022-12-09 ENCOUNTER — Ambulatory Visit: Payer: BC Managed Care – PPO

## 2022-12-11 ENCOUNTER — Encounter: Payer: Self-pay | Admitting: Physical Therapy

## 2022-12-11 ENCOUNTER — Ambulatory Visit: Payer: BC Managed Care – PPO | Admitting: Physical Therapy

## 2022-12-11 DIAGNOSIS — M5459 Other low back pain: Secondary | ICD-10-CM | POA: Diagnosis not present

## 2022-12-11 DIAGNOSIS — M79662 Pain in left lower leg: Secondary | ICD-10-CM

## 2022-12-11 DIAGNOSIS — R29898 Other symptoms and signs involving the musculoskeletal system: Secondary | ICD-10-CM | POA: Diagnosis not present

## 2022-12-11 DIAGNOSIS — M6281 Muscle weakness (generalized): Secondary | ICD-10-CM | POA: Diagnosis not present
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
## 2022-12-15 ENCOUNTER — Encounter: Payer: Self-pay | Admitting: Nurse Practitioner

## 2022-12-15 ENCOUNTER — Ambulatory Visit: Payer: BC Managed Care – PPO | Admitting: Nurse Practitioner

## 2022-12-15 ENCOUNTER — Ambulatory Visit: Payer: BC Managed Care – PPO | Admitting: Physical Therapy

## 2022-12-15 ENCOUNTER — Other Ambulatory Visit (HOSPITAL_COMMUNITY)
Admission: RE | Admit: 2022-12-15 | Discharge: 2022-12-15 | Disposition: A | Discharge status: Home / Self Care | Payer: BC Managed Care – PPO | Source: Ambulatory Visit | Attending: Nurse Practitioner | Admitting: Nurse Practitioner

## 2022-12-15 VITALS — BP 132/88 | HR 68 | Ht 61.0 in | Wt 190.0 lb

## 2022-12-15 DIAGNOSIS — M85851 Other specified disorders of bone density and structure, right thigh: Secondary | ICD-10-CM

## 2022-12-15 DIAGNOSIS — M85852 Other specified disorders of bone density and structure, left thigh: Secondary | ICD-10-CM | POA: Diagnosis not present

## 2022-12-15 DIAGNOSIS — Z124 Encounter for screening for malignant neoplasm of cervix: Secondary | ICD-10-CM

## 2022-12-15 DIAGNOSIS — M6281 Muscle weakness (generalized): Secondary | ICD-10-CM

## 2022-12-15 DIAGNOSIS — Z780 Asymptomatic menopausal state: Secondary | ICD-10-CM | POA: Diagnosis not present

## 2022-12-15 DIAGNOSIS — M79662 Pain in left lower leg: Secondary | ICD-10-CM | POA: Diagnosis not present

## 2022-12-15 DIAGNOSIS — Z01419 Encounter for gynecological examination (general) (routine) without abnormal findings: Secondary | ICD-10-CM

## 2022-12-15 DIAGNOSIS — M5459 Other low back pain: Secondary | ICD-10-CM

## 2022-12-15 DIAGNOSIS — R29898 Other symptoms and signs involving the musculoskeletal system: Secondary | ICD-10-CM

## 2022-12-15 NOTE — Progress Notes (Signed)
   Iveth Heidemann 1956/02/01 [id number]   History:  67 y.o. G3P3 presents as new patient to establish care. Postmenopausal - no HRT, no bleeding. Normal pap history. H/O rectal cancer in 2017 with bowel reconstructive surgery, also had resection in 2008, hernia repair 2009. She is worried about pelvic exam today due to diarrhea. 2017 BSO.  Gynecologic History No LMP recorded. Patient has had an ablation.   Contraception/Family planning: post menopausal status Sexually active: Yes  Health Maintenance Last Pap: 03/01/2018. Results were: Normal neg HPV Last mammogram: 08/2022. Results were: Normal per patient Last colonoscopy: 09/19/2020 (sigmoid). Scheduled next month Last Dexa: 10/24/2012. Results were: T-score -1.8  Past medical history, past surgical history, family history and social history were all reviewed and documented in the EPIC chart. Married. Part time dog groomer. 3 children, all live local. 5 grandchildren ages 1-9, one on the way.   ROS:  A ROS was performed and pertinent positives and negatives are included.  Exam:  Vitals:   12/15/22 0915  BP: 132/88  Pulse: 68  SpO2: 98%  Weight: 190 lb (86.2 kg)  Height:  (1.549 m)   Body mass index is 35.9 kg/m.  General appearance:  Normal Thyroid:  Symmetrical, normal in size, without palpable masses or nodularity. Respiratory  Auscultation:  Clear without wheezing or rhonchi Cardiovascular  Auscultation:  Regular rate, without rubs, murmurs or gallops  Edema/varicosities:  Not grossly evident Abdominal  Soft,nontender, without masses, guarding or rebound.  Liver/spleen:  No organomegaly noted  Hernia:  None appreciated  Skin  Inspection:  Grossly normal Breasts: Examined lying and sitting.   Right: Without masses, retractions, nipple discharge or axillary adenopathy.   Left: Without masses, retractions, nipple discharge or axillary adenopathy. Genitourinary   Inguinal/mons:  Normal without inguinal  adenopathy  External genitalia:  Normal appearing vulva with no masses, tenderness, or lesions  BUS/Urethra/Skene's glands:  Normal  Vagina:  Normal appearing with normal color and discharge, no lesions. Atrophic changes  Cervix:  Normal appearing without discharge or lesions  Uterus:  Normal in size, shape and contour.  Midline and mobile, nontender  Adnexa/parametria:     Rt: Normal in size, without masses or tenderness.   Lt: Normal in size, without masses or tenderness.  Anus and perineum: Normal  Digital rectal exam: Deferred  Patient informed chaperone available to be present for breast and pelvic exam. Patient has requested no chaperone to be present. Patient has been advised what will be completed during breast and pelvic exam.   Assessment/Plan:  67 y.o. G3P3 to establish care.   Well female exam with routine gynecological exam - Education provided on SBEs, importance of preventative screenings, current guidelines, high calcium diet, regular exercise, and multivitamin daily.  Labs with PCP.   Screening for cervical cancer - Plan: Cytology - PAP( [facility]). Normal pap history. If normal we discussed option to stop screening per guidelines and she is agreeable.   Postmenopausal - no HRT, no bleeding  Osteopenia of necks of both femurs - Plan: DG Bone Density. 2014 T-score -1.8. Will repeat now.   Screening for breast cancer - Normal mammogram history.  Continue annual screenings.  Normal breast exam today.  Screening for colon cancer - 2022 sigmoid. Colonoscopy scheduled next month. H/O rectal cancer.    Return in 1 year for annual.     Olivia Mackie DNP, 9:44 AM 12/15/2022
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
## 2022-12-16 LAB — CYTOLOGY - PAP
Comment: NEGATIVE
Diagnosis: NEGATIVE
High risk HPV: NEGATIVE

## 2022-12-18 ENCOUNTER — Ambulatory Visit: Payer: BC Managed Care – PPO | Admitting: Physical Therapy

## 2022-12-22 ENCOUNTER — Ambulatory Visit: Payer: BC Managed Care – PPO | Admitting: Physical Therapy

## 2022-12-22 ENCOUNTER — Encounter: Payer: Self-pay | Admitting: Physical Therapy

## 2022-12-22 DIAGNOSIS — M6281 Muscle weakness (generalized): Secondary | ICD-10-CM

## 2022-12-22 DIAGNOSIS — M79662 Pain in left lower leg: Secondary | ICD-10-CM

## 2022-12-22 DIAGNOSIS — M5459 Other low back pain: Secondary | ICD-10-CM

## 2022-12-22 DIAGNOSIS — R29898 Other symptoms and signs involving the musculoskeletal system: Secondary | ICD-10-CM
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
## 2022-12-24 ENCOUNTER — Ambulatory Visit: Payer: BC Managed Care – PPO | Admitting: Physical Therapy

## 2022-12-29 ENCOUNTER — Other Ambulatory Visit: Payer: Self-pay | Admitting: Nurse Practitioner

## 2022-12-29 ENCOUNTER — Ambulatory Visit (INDEPENDENT_AMBULATORY_CARE_PROVIDER_SITE_OTHER): Payer: BC Managed Care – PPO

## 2022-12-29 DIAGNOSIS — Z13820 Encounter for screening for osteoporosis: Secondary | ICD-10-CM

## 2022-12-29 DIAGNOSIS — Z780 Asymptomatic menopausal state: Secondary | ICD-10-CM | POA: Diagnosis not present

## 2022-12-29 DIAGNOSIS — M85851 Other specified disorders of bone density and structure, right thigh: Secondary | ICD-10-CM

## 2022-12-29 DIAGNOSIS — Z01419 Encounter for gynecological examination (general) (routine) without abnormal findings: Secondary | ICD-10-CM

## 2022-12-29 DIAGNOSIS — Z124 Encounter for screening for malignant neoplasm of cervix: Secondary | ICD-10-CM

## 2022-12-29 DIAGNOSIS — M8589 Other specified disorders of bone density and structure, multiple sites: Secondary | ICD-10-CM

## 2023-01-01 ENCOUNTER — Ambulatory Visit (HOSPITAL_BASED_OUTPATIENT_CLINIC_OR_DEPARTMENT_OTHER): Payer: BC Managed Care – PPO | Attending: Family Medicine | Admitting: Physical Therapy

## 2023-01-01 ENCOUNTER — Encounter (HOSPITAL_BASED_OUTPATIENT_CLINIC_OR_DEPARTMENT_OTHER): Payer: Self-pay | Admitting: Physical Therapy

## 2023-01-01 DIAGNOSIS — M79662 Pain in left lower leg: Secondary | ICD-10-CM | POA: Diagnosis not present

## 2023-01-01 DIAGNOSIS — M5459 Other low back pain: Secondary | ICD-10-CM | POA: Diagnosis not present

## 2023-01-01 DIAGNOSIS — M6281 Muscle weakness (generalized): Secondary | ICD-10-CM

## 2023-01-01 DIAGNOSIS — R29898 Other symptoms and signs involving the musculoskeletal system: Secondary | ICD-10-CM | POA: Diagnosis not present
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
## 2023-01-04 DIAGNOSIS — L820 Inflamed seborrheic keratosis: Secondary | ICD-10-CM | POA: Diagnosis not present

## 2023-01-04 DIAGNOSIS — L821 Other seborrheic keratosis: Secondary | ICD-10-CM | POA: Diagnosis not present

## 2023-01-15 ENCOUNTER — Ambulatory Visit (HOSPITAL_BASED_OUTPATIENT_CLINIC_OR_DEPARTMENT_OTHER): Payer: BC Managed Care – PPO | Admitting: Physical Therapy

## 2023-01-19 ENCOUNTER — Ambulatory Visit (HOSPITAL_BASED_OUTPATIENT_CLINIC_OR_DEPARTMENT_OTHER): Payer: BC Managed Care – PPO | Admitting: Physical Therapy

## 2023-01-22 ENCOUNTER — Ambulatory Visit (HOSPITAL_BASED_OUTPATIENT_CLINIC_OR_DEPARTMENT_OTHER): Payer: BC Managed Care – PPO | Admitting: Physical Therapy

## 2023-01-29 DIAGNOSIS — K56699 Other intestinal obstruction unspecified as to partial versus complete obstruction: Secondary | ICD-10-CM | POA: Diagnosis not present

## 2023-01-29 DIAGNOSIS — Z85038 Personal history of other malignant neoplasm of large intestine: Secondary | ICD-10-CM | POA: Diagnosis not present

## 2023-01-29 DIAGNOSIS — Z08 Encounter for follow-up examination after completed treatment for malignant neoplasm: Secondary | ICD-10-CM | POA: Diagnosis not present

## 2023-01-29 DIAGNOSIS — K5730 Diverticulosis of large intestine without perforation or abscess without bleeding: Secondary | ICD-10-CM | POA: Diagnosis not present

## 2023-01-29 DIAGNOSIS — Z1211 Encounter for screening for malignant neoplasm of colon: Secondary | ICD-10-CM | POA: Diagnosis not present

## 2023-04-05 DIAGNOSIS — Z86010 Personal history of colonic polyps: Secondary | ICD-10-CM | POA: Diagnosis not present

## 2023-04-05 DIAGNOSIS — K6289 Other specified diseases of anus and rectum: Secondary | ICD-10-CM | POA: Diagnosis not present

## 2023-04-05 DIAGNOSIS — K5909 Other constipation: Secondary | ICD-10-CM | POA: Diagnosis not present

## 2023-04-05 DIAGNOSIS — Z85038 Personal history of other malignant neoplasm of large intestine: Secondary | ICD-10-CM | POA: Diagnosis not present

## 2023-04-20 DIAGNOSIS — G4752 REM sleep behavior disorder: Secondary | ICD-10-CM | POA: Diagnosis not present

## 2023-04-20 DIAGNOSIS — R251 Tremor, unspecified: Secondary | ICD-10-CM | POA: Diagnosis not present

## 2023-05-25 DIAGNOSIS — M48061 Spinal stenosis, lumbar region without neurogenic claudication: Secondary | ICD-10-CM | POA: Insufficient documentation

## 2023-05-29 DIAGNOSIS — M5416 Radiculopathy, lumbar region: Secondary | ICD-10-CM | POA: Insufficient documentation

## 2023-06-10 DIAGNOSIS — M5416 Radiculopathy, lumbar region: Secondary | ICD-10-CM | POA: Diagnosis not present

## 2023-06-11 DIAGNOSIS — Z9049 Acquired absence of other specified parts of digestive tract: Secondary | ICD-10-CM | POA: Diagnosis not present

## 2023-06-11 DIAGNOSIS — K449 Diaphragmatic hernia without obstruction or gangrene: Secondary | ICD-10-CM | POA: Diagnosis not present

## 2023-06-11 DIAGNOSIS — Z885 Allergy status to narcotic agent status: Secondary | ICD-10-CM | POA: Diagnosis not present

## 2023-06-11 DIAGNOSIS — K219 Gastro-esophageal reflux disease without esophagitis: Secondary | ICD-10-CM | POA: Diagnosis not present

## 2023-06-11 DIAGNOSIS — K56609 Unspecified intestinal obstruction, unspecified as to partial versus complete obstruction: Secondary | ICD-10-CM | POA: Diagnosis not present

## 2023-06-11 DIAGNOSIS — Z4682 Encounter for fitting and adjustment of non-vascular catheter: Secondary | ICD-10-CM | POA: Diagnosis not present

## 2023-06-11 DIAGNOSIS — K529 Noninfective gastroenteritis and colitis, unspecified: Secondary | ICD-10-CM | POA: Diagnosis not present

## 2023-06-11 DIAGNOSIS — N200 Calculus of kidney: Secondary | ICD-10-CM | POA: Diagnosis not present

## 2023-06-11 DIAGNOSIS — Z90710 Acquired absence of both cervix and uterus: Secondary | ICD-10-CM | POA: Diagnosis not present

## 2023-06-11 DIAGNOSIS — Z79899 Other long term (current) drug therapy: Secondary | ICD-10-CM | POA: Diagnosis not present

## 2023-06-11 DIAGNOSIS — K6389 Other specified diseases of intestine: Secondary | ICD-10-CM | POA: Diagnosis not present

## 2023-06-11 DIAGNOSIS — K439 Ventral hernia without obstruction or gangrene: Secondary | ICD-10-CM | POA: Diagnosis not present

## 2023-06-11 DIAGNOSIS — K838 Other specified diseases of biliary tract: Secondary | ICD-10-CM | POA: Diagnosis not present

## 2023-06-11 DIAGNOSIS — Z888 Allergy status to other drugs, medicaments and biological substances status: Secondary | ICD-10-CM | POA: Diagnosis not present

## 2023-06-11 DIAGNOSIS — R109 Unspecified abdominal pain: Secondary | ICD-10-CM | POA: Diagnosis not present

## 2023-06-11 DIAGNOSIS — R112 Nausea with vomiting, unspecified: Secondary | ICD-10-CM | POA: Diagnosis not present

## 2023-06-11 DIAGNOSIS — Z85038 Personal history of other malignant neoplasm of large intestine: Secondary | ICD-10-CM | POA: Diagnosis not present

## 2023-06-14 ENCOUNTER — Telehealth: Payer: Self-pay

## 2023-06-14 NOTE — Transitions of Care (Post Inpatient/ED Visit) (Signed)
   06/14/2023  Name: [name] MRN: [id number] DOB: 10/28/1955  Today's TOC FU Call Status: Today's TOC FU Call Status:: Successful TOC FU Call Completed TOC FU Call Complete Date: 06/14/23 Patient's Name and Date of Birth confirmed.  Transition Care Management Follow-up Telephone Call Date of Discharge: 06/13/23 Discharge Facility: Other [facility]) Name of Other ([facility]) Discharge Facility: [facility] Type of Discharge: Inpatient Admission Primary Inpatient Discharge Diagnosis:: bowel obstruction How have you been since you were released from the hospital?: Better Any questions or concerns?: No  Items Reviewed: Did you receive and understand the discharge instructions provided?: Yes Medications obtained,verified, and reconciled?: Yes (Medications Reviewed) Any new allergies since your discharge?: No Dietary orders reviewed?: Yes Do you have support at home?: Yes People in Home: child(ren), adult  Medications Reviewed Today: Medications Reviewed Today     Reviewed by Karena Addison, LPN (Licensed Practical Nurse) on 06/14/23 at 1226  Med List Status: <None>   Medication Order Taking? Sig Documenting Provider Last Dose Status Informant  Ascorbic Acid 100 MG CHEW [id number] No daily. [provider] Taking Active   b complex vitamins capsule [id number] No Take 1 capsule by mouth daily. [provider] Taking Active   Calcium Carb-Cholecalciferol 500-15 MG-MCG TABS [id number] No  [provider] Taking Active   cholecalciferol (VITAMIN D3) 10 MCG (400 UNIT) TABS tablet [id number] No Take by mouth. [provider] Taking Active   Collagen Hydrolysate POWD [id number] No daily. [provider] Taking Active   famotidine (PEPCID) 40 MG tablet [id number] No Take by mouth. [provider] Taking Active   hydrocortisone-pramoxine [facility]) 2.5-1 % rectal cream [id number] No Place rectally. [provider] Taking Active   Ibuprofen-Acetaminophen 125-250 MG TABS [id number] No Take by mouth. [provider] Taking Active   lidocaine (XYLOCAINE) 2 % jelly [id number] No Apply to affected area 3 to 5 minutes before defecation.  Can use once daily as needed for rectal pain [provider] Taking Active   ondansetron (ZOFRAN) 4 MG tablet [id number] No Take 4 mg by mouth every 8 (eight) hours as needed. [provider] Taking Active   traMADol (ULTRAM) 50 MG tablet [id number] No Take by mouth. [provider] Taking Active             Home Care and Equipment/Supplies: Were Home Health Services Ordered?: NA Any new equipment or medical supplies ordered?: NA  Functional Questionnaire: Do you need assistance with bathing/showering or dressing?: No Do you need assistance with meal preparation?: No Do you need assistance with eating?: No Do you have difficulty maintaining continence: No Do you need assistance with getting out of bed/getting out of a chair/moving?: No Do you have difficulty managing or taking your medications?: No  Follow up appointments reviewed: PCP Follow-up appointment confirmed?: Yes Date of PCP follow-up appointment?: 06/17/23 Follow-up Provider: [facility] Follow-up appointment confirmed?: No Reason Specialist Follow-Up Not Confirmed: Patient has Specialist Provider Number and will Call for Appointment Do you need transportation to your follow-up appointment?: No Do you understand care options if your condition(s) worsen?: Yes-patient verbalized understanding    SIGNATURE Karena Addison, LPN [facility] Nurse Health Advisor Direct Dial [phone]

## 2023-06-16 ENCOUNTER — Inpatient Hospital Stay: Payer: BC Managed Care – PPO | Admitting: Family Medicine

## 2023-06-16 ENCOUNTER — Encounter: Payer: Self-pay | Admitting: Family Medicine

## 2023-06-16 ENCOUNTER — Ambulatory Visit: Payer: BC Managed Care – PPO | Admitting: Family Medicine

## 2023-06-16 VITALS — BP 125/77 | HR 78 | Temp 97.4°F | Resp 18 | Ht 62.0 in | Wt 183.6 lb

## 2023-06-16 DIAGNOSIS — Z09 Encounter for follow-up examination after completed treatment for conditions other than malignant neoplasm: Secondary | ICD-10-CM | POA: Diagnosis not present

## 2023-06-16 DIAGNOSIS — K529 Noninfective gastroenteritis and colitis, unspecified: Secondary | ICD-10-CM

## 2023-06-16 LAB — COMPREHENSIVE METABOLIC PANEL
ALT: 23 U/L (ref 0–35)
AST: 22 U/L (ref 0–37)
Albumin: 3.9 g/dL (ref 3.5–5.2)
Alkaline Phosphatase: 60 U/L (ref 39–117)
BUN: 16 mg/dL (ref 6–23)
CO2: 27 meq/L (ref 19–32)
Calcium: 9.4 mg/dL (ref 8.4–10.5)
Chloride: 107 meq/L (ref 96–112)
Creatinine, Ser: 0.74 mg/dL (ref 0.40–1.20)
GFR: 84.06 mL/min (ref >60.00)
Glucose, Bld: 88 mg/dL (ref 70–99)
Potassium: 3.8 meq/L (ref 3.5–5.1)
Sodium: 143 meq/L (ref 135–145)
Total Bilirubin: 0.7 mg/dL (ref 0.2–1.2)
Total Protein: 6.4 g/dL (ref 6.0–8.3)

## 2023-06-16 LAB — CBC WITH DIFFERENTIAL/PLATELET
Basophils Absolute: 0 10*3/uL (ref 0.0–0.1)
Basophils Relative: 0.5 % (ref 0.0–3.0)
Eosinophils Absolute: 0.2 10*3/uL (ref 0.0–0.7)
Eosinophils Relative: 1.9 % (ref 0.0–5.0)
HCT: 39.5 % (ref 36.0–46.0)
Hemoglobin: 12.7 g/dL (ref 12.0–15.0)
Lymphocytes Relative: 26.6 % (ref 12.0–46.0)
Lymphs Abs: 2.1 10*3/uL (ref 0.7–4.0)
MCHC: 32.2 g/dL (ref 30.0–36.0)
MCV: 89 fL (ref 78.0–100.0)
Monocytes Absolute: 0.7 10*3/uL (ref 0.1–1.0)
Monocytes Relative: 8.6 % (ref 3.0–12.0)
Neutro Abs: 4.9 10*3/uL (ref 1.4–7.7)
Neutrophils Relative %: 62.4 % (ref 43.0–77.0)
Platelets: 305 10*3/uL (ref 150.0–400.0)
RBC: 4.44 Mil/uL (ref 3.87–5.11)
RDW: 13.8 % (ref 11.5–15.5)
WBC: 7.9 10*3/uL (ref 4.0–10.5)

## 2023-06-16 NOTE — Progress Notes (Signed)
Acute Office Visit  Subjective:     Patient ID: [name], female    DOB: 11/04/1955, 67 y.o.   MRN: [id number]  Chief Complaint  Patient presents with   Follow-up    Hospital follow up    HPI Patient is in today for  hospital follow-up.   Patient was recently hospitalized at [facility] 06/11/23-06/13/23 for abdominal pain with CT confirming colitis, bowel obstruction, and a ventral hernia. She had NG compression with improvement in symptoms and was given IV antibiotics. Upper GI study was done on 06/12/23 and did not show bowel obstruction. She was able to tolerate advancing diet and started having bowel movements. She was given Augmentin at discharge.  Discussed the use of AI scribe software for clinical note transcription with the patient, who gave verbal consent to proceed.  History of Present Illness   The patient, with a complex medical history including multiple bowel resections, was recently hospitalized at [facility] from the 11th to the 13th for abdominal pain. During the hospital stay, she was diagnosed with possible colitis and bowel obstruction, and a ventral hernia was also noted. The patient underwent treatment with an NG tube and IV antibiotics. An upper GI study was performed, which appeared normal.  Since discharge, the patient has been experiencing diarrhea, which she describes as sudden and uncontrollable, likening it to 'pure water.' She reports no blood in the stool and denies vomiting, but does experience persistent nausea. The patient has been taking Zofran for nausea, which she reports is effective but causes drowsiness. She was started on Augmentin during her hospital stay, which she is still taking. She is aware the antibiotics may be worsening the GI symptoms.   The patient's diet has been bland since discharge, consisting mainly of potatoes and rice. She reports a feeling of nausea currently. The patient has a history of  multiple bowel resections and has never had solid stool since 2010. She regularly follows up with a gastroenterologist, who recently performed a colonoscopy and reported that everything was clear.  The patient also mentions a ventral hernia, which she describes as a bulging sensation in the abdominal area that resolves with gentle pressure. She reports no significant tenderness in the abdomen currently.             ROS All review of systems negative except what is listed in the HPI      Objective:    BP 125/77   Pulse 78   Temp (!) 97.4 F (36.3 C) (Oral)   Resp 18   Ht 5\' 2"  (1.575 m)   Wt 183 lb 9.6 oz (83.3 kg)   SpO2 97%   BMI 33.58 kg/m    Physical Exam Vitals reviewed.  Constitutional:      Appearance: Normal appearance.  Cardiovascular:     Rate and Rhythm: Normal rate and regular rhythm.     Heart sounds: Normal heart sounds.  Pulmonary:     Effort: Pulmonary effort is normal.     Breath sounds: Normal breath sounds.  Abdominal:     General: Bowel sounds are normal. There is no distension.     Palpations: Abdomen is soft. There is no mass.     Tenderness: There is no abdominal tenderness. There is no guarding or rebound.  Skin:    General: Skin is warm and dry.  Neurological:     Mental Status: She is alert and oriented to person, place, and time.  Psychiatric:  Mood and Affect: Mood normal.        Behavior: Behavior normal.        Thought Content: Thought content normal.        Judgment: Judgment normal.        No results found for any visits on 06/16/23.      Assessment & Plan:   Problem List Items Addressed This Visit   None Visit Diagnoses     Hospital discharge follow-up    -  Primary   Colitis       Relevant Orders   CBC with Differential/Platelet   Comprehensive metabolic panel         Abdominal Pain / Possible Colitis   Recent hospitalization for abdominal pain with possible colitis and bowel obstruction. Currently  experiencing diarrhea and nausea. On Augmentin which could be contributing to symptoms. No blood in stool or vomiting.   -Continue Augmentin as prescribed.   -Continue bland diet until symptoms improve.   -Stay hydrated to prevent dehydration due to diarrhea.  Continue PRN Zofran for nausea. -Check labs to ensure she is trending towards baseline.   -Consider follow-up with GI in a couple of weeks.    Ventral Hernia   Patient reports a reducible ventral hernia. No current pain or significant discomfort.   -Observe and monitor for changes.   -Consider consultation with a general surgeon if it becomes painful or non-reducible.   -Consider use of a binder for support during physical activity.        No orders of the defined types were placed in this encounter.   Return if symptoms worsen or fail to improve.  Clayborne Dana, NP

## 2023-06-17 ENCOUNTER — Inpatient Hospital Stay: Payer: BC Managed Care – PPO | Admitting: Family Medicine

## 2023-06-24 DIAGNOSIS — K529 Noninfective gastroenteritis and colitis, unspecified: Secondary | ICD-10-CM | POA: Diagnosis not present

## 2023-06-24 DIAGNOSIS — M48061 Spinal stenosis, lumbar region without neurogenic claudication: Secondary | ICD-10-CM | POA: Diagnosis not present

## 2023-07-02 DIAGNOSIS — Z1231 Encounter for screening mammogram for malignant neoplasm of breast: Secondary | ICD-10-CM | POA: Diagnosis not present

## 2023-07-02 DIAGNOSIS — R92323 Mammographic fibroglandular density, bilateral breasts: Secondary | ICD-10-CM | POA: Diagnosis not present

## 2023-07-06 DIAGNOSIS — K6289 Other specified diseases of anus and rectum: Secondary | ICD-10-CM | POA: Diagnosis not present

## 2023-07-06 DIAGNOSIS — R197 Diarrhea, unspecified: Secondary | ICD-10-CM | POA: Diagnosis not present

## 2023-07-06 DIAGNOSIS — K5909 Other constipation: Secondary | ICD-10-CM | POA: Diagnosis not present

## 2023-07-06 DIAGNOSIS — D72829 Elevated white blood cell count, unspecified: Secondary | ICD-10-CM | POA: Diagnosis not present

## 2023-07-06 DIAGNOSIS — R1084 Generalized abdominal pain: Secondary | ICD-10-CM | POA: Diagnosis not present

## 2023-07-15 ENCOUNTER — Encounter: Payer: Self-pay | Admitting: Family Medicine

## 2023-07-15 DIAGNOSIS — N6321 Unspecified lump in the left breast, upper outer quadrant: Secondary | ICD-10-CM | POA: Diagnosis not present

## 2023-07-15 DIAGNOSIS — N6012 Diffuse cystic mastopathy of left breast: Secondary | ICD-10-CM | POA: Diagnosis not present

## 2023-07-15 DIAGNOSIS — R921 Mammographic calcification found on diagnostic imaging of breast: Secondary | ICD-10-CM | POA: Diagnosis not present

## 2023-07-15 DIAGNOSIS — R928 Other abnormal and inconclusive findings on diagnostic imaging of breast: Secondary | ICD-10-CM | POA: Diagnosis not present

## 2023-07-15 DIAGNOSIS — R92322 Mammographic fibroglandular density, left breast: Secondary | ICD-10-CM | POA: Diagnosis not present

## 2023-07-20 DIAGNOSIS — R259 Unspecified abnormal involuntary movements: Secondary | ICD-10-CM | POA: Diagnosis not present

## 2023-08-04 DIAGNOSIS — R197 Diarrhea, unspecified: Secondary | ICD-10-CM | POA: Diagnosis not present

## 2023-08-16 ENCOUNTER — Encounter (HOSPITAL_COMMUNITY): Payer: Self-pay

## 2023-08-17 ENCOUNTER — Inpatient Hospital Stay (HOSPITAL_COMMUNITY)
Admission: AD | Admit: 2023-08-17 | Discharge: 2023-08-23 | DRG: 371 | Disposition: A | Discharge status: Home / Self Care | Payer: BC Managed Care – PPO | Source: Other Acute Inpatient Hospital | Attending: Internal Medicine | Admitting: Internal Medicine

## 2023-08-17 ENCOUNTER — Encounter (HOSPITAL_COMMUNITY): Payer: Self-pay | Admitting: Internal Medicine

## 2023-08-17 ENCOUNTER — Other Ambulatory Visit: Payer: Self-pay

## 2023-08-17 DIAGNOSIS — L0291 Cutaneous abscess, unspecified: Secondary | ICD-10-CM | POA: Diagnosis not present

## 2023-08-17 DIAGNOSIS — N739 Female pelvic inflammatory disease, unspecified: Secondary | ICD-10-CM | POA: Diagnosis present

## 2023-08-17 DIAGNOSIS — K611 Rectal abscess: Secondary | ICD-10-CM | POA: Diagnosis not present

## 2023-08-17 DIAGNOSIS — Z860100 Personal history of colon polyps, unspecified: Secondary | ICD-10-CM | POA: Diagnosis not present

## 2023-08-17 DIAGNOSIS — Z885 Allergy status to narcotic agent status: Secondary | ICD-10-CM | POA: Diagnosis not present

## 2023-08-17 DIAGNOSIS — G928 Other toxic encephalopathy: Secondary | ICD-10-CM | POA: Diagnosis not present

## 2023-08-17 DIAGNOSIS — Z91041 Radiographic dye allergy status: Secondary | ICD-10-CM

## 2023-08-17 DIAGNOSIS — E871 Hypo-osmolality and hyponatremia: Secondary | ICD-10-CM | POA: Diagnosis not present

## 2023-08-17 DIAGNOSIS — R109 Unspecified abdominal pain: Secondary | ICD-10-CM | POA: Diagnosis not present

## 2023-08-17 DIAGNOSIS — Z9049 Acquired absence of other specified parts of digestive tract: Secondary | ICD-10-CM

## 2023-08-17 DIAGNOSIS — I10 Essential (primary) hypertension: Secondary | ICD-10-CM | POA: Diagnosis not present

## 2023-08-17 DIAGNOSIS — E869 Volume depletion, unspecified: Secondary | ICD-10-CM | POA: Diagnosis present

## 2023-08-17 DIAGNOSIS — Z87442 Personal history of urinary calculi: Secondary | ICD-10-CM

## 2023-08-17 DIAGNOSIS — C20 Malignant neoplasm of rectum: Secondary | ICD-10-CM | POA: Diagnosis present

## 2023-08-17 DIAGNOSIS — Z85048 Personal history of other malignant neoplasm of rectum, rectosigmoid junction, and anus: Secondary | ICD-10-CM

## 2023-08-17 DIAGNOSIS — Z6832 Body mass index (BMI) 32.0-32.9, adult: Secondary | ICD-10-CM

## 2023-08-17 DIAGNOSIS — Z883 Allergy status to other anti-infective agents status: Secondary | ICD-10-CM

## 2023-08-17 DIAGNOSIS — A0472 Enterocolitis due to Clostridium difficile, not specified as recurrent: Secondary | ICD-10-CM | POA: Diagnosis present

## 2023-08-17 DIAGNOSIS — Z23 Encounter for immunization: Secondary | ICD-10-CM | POA: Diagnosis not present

## 2023-08-17 DIAGNOSIS — K651 Peritoneal abscess: Secondary | ICD-10-CM | POA: Diagnosis not present

## 2023-08-17 DIAGNOSIS — K630 Abscess of intestine: Secondary | ICD-10-CM | POA: Diagnosis not present

## 2023-08-17 DIAGNOSIS — Z8049 Family history of malignant neoplasm of other genital organs: Secondary | ICD-10-CM

## 2023-08-17 DIAGNOSIS — Z803 Family history of malignant neoplasm of breast: Secondary | ICD-10-CM

## 2023-08-17 DIAGNOSIS — Z8041 Family history of malignant neoplasm of ovary: Secondary | ICD-10-CM

## 2023-08-17 DIAGNOSIS — Z823 Family history of stroke: Secondary | ICD-10-CM

## 2023-08-17 DIAGNOSIS — Z833 Family history of diabetes mellitus: Secondary | ICD-10-CM

## 2023-08-17 DIAGNOSIS — K529 Noninfective gastroenteritis and colitis, unspecified: Secondary | ICD-10-CM | POA: Diagnosis not present

## 2023-08-17 DIAGNOSIS — Z79899 Other long term (current) drug therapy: Secondary | ICD-10-CM

## 2023-08-17 DIAGNOSIS — Z800 Family history of malignant neoplasm of digestive organs: Secondary | ICD-10-CM | POA: Diagnosis not present

## 2023-08-17 DIAGNOSIS — Z91148 Patient's other noncompliance with medication regimen for other reason: Secondary | ICD-10-CM

## 2023-08-17 DIAGNOSIS — E876 Hypokalemia: Secondary | ICD-10-CM | POA: Diagnosis present

## 2023-08-17 DIAGNOSIS — A498 Other bacterial infections of unspecified site: Secondary | ICD-10-CM | POA: Diagnosis not present

## 2023-08-17 DIAGNOSIS — G250 Essential tremor: Secondary | ICD-10-CM | POA: Diagnosis present

## 2023-08-17 DIAGNOSIS — Z8249 Family history of ischemic heart disease and other diseases of the circulatory system: Secondary | ICD-10-CM

## 2023-08-17 DIAGNOSIS — K6819 Other retroperitoneal abscess: Principal | ICD-10-CM | POA: Diagnosis present

## 2023-08-17 DIAGNOSIS — K219 Gastro-esophageal reflux disease without esophagitis: Secondary | ICD-10-CM | POA: Diagnosis present

## 2023-08-17 DIAGNOSIS — E66811 Obesity, class 1: Secondary | ICD-10-CM | POA: Diagnosis present

## 2023-08-17 DIAGNOSIS — Z820 Family history of epilepsy and other diseases of the nervous system: Secondary | ICD-10-CM | POA: Diagnosis not present

## 2023-08-17 MED ORDER — PIPERACILLIN-TAZOBACTAM 3.375 G IVPB
3.3750 g | Freq: Three times a day (TID) | INTRAVENOUS | Status: DC
  Administered 2023-08-18 – 2023-08-21 (×11): 3.375 g via INTRAVENOUS
  Filled 2023-08-17 (×11): qty 50

## 2023-08-17 MED ORDER — FIDAXOMICIN 200 MG PO TABS
200.0000 mg | ORAL_TABLET | Freq: Two times a day (BID) | ORAL | Status: DC
  Administered 2023-08-17 – 2023-08-18 (×2): 200 mg via ORAL
  Filled 2023-08-17 (×3): qty 1

## 2023-08-17 MED ORDER — ACETAMINOPHEN 650 MG RE SUPP: 650.0000 mg | Freq: Four times a day (QID) | RECTAL | Status: DC | PRN

## 2023-08-17 MED ORDER — LIDOCAINE HCL 2 % EX GEL: 1.0000 | Freq: Every day | CUTANEOUS | Status: DC

## 2023-08-17 MED ORDER — PROPRANOLOL HCL 10 MG PO TABS
10.0000 mg | ORAL_TABLET | Freq: Three times a day (TID) | ORAL | Status: DC
  Administered 2023-08-17 – 2023-08-23 (×13): 10 mg via ORAL
  Filled 2023-08-17 (×20): qty 1

## 2023-08-17 MED ORDER — ACETAMINOPHEN 325 MG PO TABS
650.0000 mg | ORAL_TABLET | Freq: Four times a day (QID) | ORAL | Status: DC | PRN
  Administered 2023-08-17 – 2023-08-23 (×9): 650 mg via ORAL
  Filled 2023-08-17 (×10): qty 2

## 2023-08-17 MED ORDER — LIDOCAINE HCL URETHRAL/MUCOSAL 2 % EX GEL
1.0000 | Freq: Three times a day (TID) | CUTANEOUS | Status: DC | PRN
  Administered 2023-08-18 – 2023-08-20 (×2): 1 via TOPICAL
  Filled 2023-08-17 (×3): qty 6

## 2023-08-17 MED ORDER — PIPERACILLIN-TAZOBACTAM 3.375 G IVPB 30 MIN
3.3750 g | Freq: Once | INTRAVENOUS | Status: AC
  Administered 2023-08-17: 3.375 g via INTRAVENOUS
  Filled 2023-08-17: qty 50

## 2023-08-17 MED ORDER — HYDROCORTISONE (PERIANAL) 2.5 % EX CREA
1.0000 | TOPICAL_CREAM | Freq: Two times a day (BID) | CUTANEOUS | Status: DC
Start: 2023-08-17 — End: 2023-08-23
  Administered 2023-08-17 – 2023-08-23 (×11): 1 via RECTAL
  Filled 2023-08-17 (×3): qty 28.35

## 2023-08-17 MED ORDER — ORAL CARE MOUTH RINSE: 15.0000 mL | OROMUCOSAL | Status: DC | PRN

## 2023-08-17 MED ORDER — ONDANSETRON HCL 4 MG/2ML IJ SOLN: 4.0000 mg | Freq: Four times a day (QID) | INTRAMUSCULAR | Status: DC | PRN

## 2023-08-17 MED ORDER — FAMOTIDINE 20 MG PO TABS
20.0000 mg | ORAL_TABLET | Freq: Every day | ORAL | Status: DC
  Administered 2023-08-17 – 2023-08-23 (×7): 20 mg via ORAL
  Filled 2023-08-17 (×7): qty 1

## 2023-08-17 MED ORDER — INFLUENZA VAC A&B SURF ANT ADJ 0.5 ML IM SUSY
0.5000 mL | PREFILLED_SYRINGE | INTRAMUSCULAR | Status: DC
  Filled 2023-08-17: qty 0.5
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
## 2023-08-17 NOTE — Progress Notes (Signed)
Patient arrived via EMS to [facility] accompanied by [facility] staff. Patient alert and oriented x4 and ambulated to the bed with minimal assist. [facility] paged.

## 2023-08-17 NOTE — Progress Notes (Addendum)
Pharmacy Antibiotic Note  [name] is a 67 y.o. female admitted on 08/17/2023 with  perirectal abscess .  Pharmacy has been consulted for Zosyn dosing.  Plan: Zosyn 3.375g IV x1 now then 3.375g IV q8h - 4hr infusion Monitor renal function, culture results, and clinical status  Height: 5\' 3"  (160 cm) Weight: 83.2 kg (183 lb 6.8 oz) IBW/kg (Calculated) : 52.4  Temp (24hrs), Avg:98.9 F (37.2 C), Min:98.7 F (37.1 C), Max:99.1 F (37.3 C)  No results for input(s): "WBC", "CREATININE", "LATICACIDVEN", "VANCOTROUGH", "VANCOPEAK", "VANCORANDOM", "GENTTROUGH", "GENTPEAK", "GENTRANDOM", "TOBRATROUGH", "TOBRAPEAK", "TOBRARND", "AMIKACINPEAK", "AMIKACINTROU", "AMIKACIN" in the last 168 hours.  CrCl cannot be calculated (Patient's most recent lab result is older than the maximum 21 days allowed.).    Allergies  Allergen Reactions   Nitrofurantoin Monohyd Macro Anaphylaxis    Throat swelling    Iodinated Contrast Media Hives    Oral CT contrast-chalky on 06-01-16 Face turned red, hot, dizzy   Morphine Other (See Comments)    Hallucinations   Metronidazole Nausea Only    Antimicrobials this admission: Zosyn 12/17 >>  Dose adjustments this admission:  Microbiology results: pending  Thank you for allowing pharmacy to be a part of this patient's care.  Fayne Norrie 08/17/2023 5:20 PM

## 2023-08-18 ENCOUNTER — Telehealth (HOSPITAL_COMMUNITY): Payer: Self-pay | Admitting: Pharmacy Technician

## 2023-08-18 ENCOUNTER — Inpatient Hospital Stay (HOSPITAL_COMMUNITY): Payer: BC Managed Care – PPO

## 2023-08-18 ENCOUNTER — Other Ambulatory Visit (HOSPITAL_COMMUNITY): Payer: Self-pay

## 2023-08-18 DIAGNOSIS — A498 Other bacterial infections of unspecified site: Secondary | ICD-10-CM

## 2023-08-18 DIAGNOSIS — R109 Unspecified abdominal pain: Secondary | ICD-10-CM

## 2023-08-18 DIAGNOSIS — K611 Rectal abscess: Secondary | ICD-10-CM | POA: Diagnosis not present

## 2023-08-18 DIAGNOSIS — Z85048 Personal history of other malignant neoplasm of rectum, rectosigmoid junction, and anus: Secondary | ICD-10-CM | POA: Diagnosis not present

## 2023-08-18 DIAGNOSIS — K529 Noninfective gastroenteritis and colitis, unspecified: Secondary | ICD-10-CM | POA: Diagnosis not present

## 2023-08-18 LAB — COMPREHENSIVE METABOLIC PANEL
ALT: 30 U/L (ref 0–44)
AST: 29 U/L (ref 15–41)
Albumin: 3 g/dL — ABNORMAL LOW (ref 3.5–5.0)
Alkaline Phosphatase: 67 U/L (ref 38–126)
Anion gap: 11 (ref 5–15)
BUN: 8 mg/dL (ref 8–23)
CO2: 24 mmol/L (ref 22–32)
Calcium: 8.8 mg/dL — ABNORMAL LOW (ref 8.9–10.3)
Chloride: 105 mmol/L (ref 98–111)
Creatinine, Ser: 0.9 mg/dL (ref 0.44–1.00)
GFR, Estimated: >60 mL/min (ref >60)
Glucose, Bld: 91 mg/dL (ref 70–99)
Potassium: 3.4 mmol/L — ABNORMAL LOW (ref 3.5–5.1)
Sodium: 140 mmol/L (ref 135–145)
Total Bilirubin: 1.1 mg/dL (ref <1.2)
Total Protein: 6 g/dL — ABNORMAL LOW (ref 6.5–8.1)

## 2023-08-18 LAB — CBC
HCT: 34.2 % — ABNORMAL LOW (ref 36.0–46.0)
Hemoglobin: 11.1 g/dL — ABNORMAL LOW (ref 12.0–15.0)
MCH: 29.2 pg (ref 26.0–34.0)
MCHC: 32.5 g/dL (ref 30.0–36.0)
MCV: 90 fL (ref 80.0–100.0)
Platelets: 254 10*3/uL (ref 150–400)
RBC: 3.8 MIL/uL — ABNORMAL LOW (ref 3.87–5.11)
RDW: 12.7 % (ref 11.5–15.5)
WBC: 11.6 10*3/uL — ABNORMAL HIGH (ref 4.0–10.5)
nRBC: 0 % (ref 0.0–0.2)

## 2023-08-18 LAB — PROTIME-INR
INR: 1.1 (ref 0.8–1.2)
Prothrombin Time: 14.7 s (ref 11.4–15.2)

## 2023-08-18 LAB — C DIFFICILE QUICK SCREEN W PCR REFLEX
C Diff antigen: NEGATIVE
C Diff interpretation: NOT DETECTED
C Diff toxin: NEGATIVE

## 2023-08-18 LAB — HIV ANTIBODY (ROUTINE TESTING W REFLEX): HIV Screen 4th Generation wRfx: NONREACTIVE

## 2023-08-18 MED ORDER — FENTANYL CITRATE (PF) 100 MCG/2ML IJ SOLN
INTRAMUSCULAR | Status: AC | PRN
  Administered 2023-08-18 (×4): 25 ug via INTRAVENOUS

## 2023-08-18 MED ORDER — HYDROMORPHONE HCL 1 MG/ML IJ SOLN
0.5000 mg | INTRAMUSCULAR | Status: AC | PRN
  Administered 2023-08-18 – 2023-08-19 (×3): 0.5 mg via INTRAVENOUS
  Filled 2023-08-18 (×3): qty 0.5

## 2023-08-18 MED ORDER — FENTANYL CITRATE (PF) 100 MCG/2ML IJ SOLN
INTRAMUSCULAR | Status: AC
  Filled 2023-08-18: qty 2

## 2023-08-18 MED ORDER — MIDAZOLAM HCL 2 MG/2ML IJ SOLN
INTRAMUSCULAR | Status: AC | PRN
  Administered 2023-08-18: 1 mg via INTRAVENOUS
  Administered 2023-08-18 (×2): .5 mg via INTRAVENOUS

## 2023-08-18 MED ORDER — LIDOCAINE HCL 1 % IJ SOLN
10.0000 mL | Freq: Once | INTRAMUSCULAR | Status: AC
  Administered 2023-08-18: 10 mL via INTRADERMAL

## 2023-08-18 MED ORDER — SODIUM CHLORIDE 0.9% FLUSH
5.0000 mL | Freq: Three times a day (TID) | INTRAVENOUS | Status: DC
  Administered 2023-08-18 – 2023-08-22 (×9): 5 mL

## 2023-08-18 MED ORDER — HYDROCODONE-ACETAMINOPHEN 5-325 MG PO TABS
1.0000 | ORAL_TABLET | Freq: Four times a day (QID) | ORAL | Status: DC | PRN
  Administered 2023-08-18: 2 via ORAL
  Administered 2023-08-18: 1 via ORAL
  Administered 2023-08-19 (×2): 2 via ORAL
  Administered 2023-08-19: 1 via ORAL
  Administered 2023-08-20 (×3): 2 via ORAL
  Filled 2023-08-18 (×2): qty 2
  Filled 2023-08-18: qty 1
  Filled 2023-08-18 (×5): qty 2

## 2023-08-18 MED ORDER — MIDAZOLAM HCL 2 MG/2ML IJ SOLN
INTRAMUSCULAR | Status: AC
  Filled 2023-08-18: qty 2

## 2023-08-18 MED ORDER — ENSURE ENLIVE PO LIQD
237.0000 mL | Freq: Two times a day (BID) | ORAL | Status: DC
  Administered 2023-08-19 – 2023-08-23 (×7): 237 mL via ORAL

## 2023-08-18 NOTE — Hospital Course (Addendum)
[name] is a 67 y.o. female with a history of bowel resection, colostomy with reversal x2, rectal tumor, hypertension, GERD, recurrent C. difficile.  Patient presented secondary to fever and confusion to an outside ED and was found to have evidence of a perirectal abscess, meeting sepsis criteria. She was transferred to [facility]. General surgery consulted and recommend IR evaluation for aspiration. 12/18> CT-guided drainage catheter placed and presacral abscess. ID following.Pelvic drain accidentally got removed 12/21 and CT obtained showing 6.5X 4.8 cm pelvic abscess with large amount of gas>and  reinserted drain on 12/22. Fuid sample with Klebsiella oxytoca. Overall clinically improved.  Surgery has cleared the patient, discussed with ID and okay for discharge on oral antibiotic.  She will follow-up with drain clinic

## 2023-08-18 NOTE — Procedures (Signed)
Interventional Radiology Procedure:   Indications: Perirectal / presacral abscess  Procedure: CT guided drain placement  Findings: 10 Fr drain placed in presacral abscess.  Aspirated air and 3 ml of brown-yellow purulent fluid.  Drain attached to gravity bag.  Complications: None     EBL: Minimal  Plan: Send fluid for culture.  Will need follow up CT and drain injection in future.   Linda Meech R. Lowella Dandy, MD  Pager: [phone]

## 2023-08-18 NOTE — Plan of Care (Signed)

## 2023-08-18 NOTE — Telephone Encounter (Addendum)
Patient Product/process development scientist completed.    The patient is insured through [employer] [facility]. Patient has [employer], may use a copay card, and/or apply for patient assistance if available.    Ran test claim for Dificid 200 mg and the current 10 day co-pay is $60.00.   This test claim was processed through [facility]- copay amounts may vary at other pharmacies due to pharmacy/plan contracts, or as the patient moves through the different stages of their insurance plan.     Roland Earl, CPHT Pharmacy Technician III Certified Patient Advocate [facility] Patient Advocate Team Direct Number: [phone]  Fax: [phone]

## 2023-08-18 NOTE — Progress Notes (Signed)
      Chief Complaint/Subjective: Chronic diarrhea, uncomfortable in gluteal area, no abdominal pain.  Objective: Vital signs in last 24 hours: Temp:  [98.1 F (36.7 C)-99.1 F (37.3 C)] 98.4 F (36.9 C) (12/18 0253) Pulse Rate:  [66-87] 73 (12/18 0253) Resp:  [14-20] 17 (12/18 0253) BP: (92-140)/(55-88) 125/88 (12/18 0253) SpO2:  [96 %-99 %] 96 % (12/18 0253) Weight:  [83.2 kg] 83.2 kg (12/17 1525) Last BM Date : 08/17/23 Intake/Output from previous day: 12/17 0701 - 12/18 0700 In: 120 [P.O.:120] Out: -   PE: Gen: NAd Resp: nonlabored Card: RRR Abd: soft, NT, ND Back: no fluctuance or palpable mass, no prolapse  Lab Results:  Recent Labs    08/18/23 0554  WBC 11.6*  HGB 11.1*  HCT 34.2*  PLT 254   Recent Labs    08/18/23 0554  NA 140  K 3.4*  CL 105  CO2 24  GLUCOSE 91  BUN 8  CREATININE 0.90  CALCIUM 8.8*   No results for input(s): "LABPROT", "INR" in the last 72 hours.    Component Value Date/Time   NA 140 08/18/2023 0554   NA 141 11/18/2016 0820   K 3.4 (L) 08/18/2023 0554   K 4.2 11/18/2016 0820   CL 105 08/18/2023 0554   CO2 24 08/18/2023 0554   CO2 26 11/18/2016 0820   GLUCOSE 91 08/18/2023 0554   GLUCOSE 96 11/18/2016 0820   BUN 8 08/18/2023 0554   BUN 18.6 11/18/2016 0820   CREATININE 0.90 08/18/2023 0554   CREATININE 0.59 03/07/2019 1005   CREATININE 0.8 11/18/2016 0820   CALCIUM 8.8 (L) 08/18/2023 0554   CALCIUM 9.6 11/18/2016 0820   PROT 6.0 (L) 08/18/2023 0554   PROT 7.0 11/18/2016 0820   ALBUMIN 3.0 (L) 08/18/2023 0554   ALBUMIN 3.2 (L) 11/18/2016 0820   AST 29 08/18/2023 0554   AST 12 11/18/2016 0820   ALT 30 08/18/2023 0554   ALT 10 11/18/2016 0820   ALKPHOS 67 08/18/2023 0554   ALKPHOS 60 11/18/2016 0820   BILITOT 1.1 08/18/2023 0554   BILITOT 0.78 11/18/2016 0820   GFRNONAA >60 08/18/2023 0554   GFRAA >60 10/27/2016 0431    Assessment/Plan High peri-rectal (colo) abscess. She has a complex history involving LAR  for mass, redo LAR with colo-anal anastomosis with chronic diarrhea who is being treated for c diff colitis and was found to have an abscess between the large intestine and the sacrum.  C diff colitis - dificid Pericolonic pelvic abscess - IR drainage today, zosyn  FEN - NPO for procedure VTE - lovenox ID - dificid , zosyn 12/17--> , will get ID involvement for antibiotic concern of these 2 difficult infections Disposition - inpatient   LOS: 1 day   I reviewed last 24 h vitals and pain scores, last 48 h intake and output, last 24 h labs and trends, and last 24 h imaging results.  This care required high  level of medical decision making.   [facility] 08/18/2023, 9:01 AM Please see Amion for pager number during day hours 7:00am-4:30pm or 7:00am -11:30am on weekends

## 2023-08-18 NOTE — Progress Notes (Signed)
   08/18/23 1533  Spiritual Encounters  Type of Visit Initial  Care provided to: Pt and family  Conversation partners present during encounter Nurse  Reason for visit Routine spiritual support  OnCall Visit No   Visited patient, spouse Loraine Leriche and daughter Wyn Forster at bedside assisting with meal. Patient having a difficult day, "not a good time for visit", will follow up

## 2023-08-18 NOTE — Progress Notes (Addendum)
PROGRESS NOTE    [name]  ZOX:[id number] DOB: 1955-10-07 DOA: 08/17/2023 PCP: Clayborne Dana, NP   Brief Narrative: [name] is a 67 y.o. female with a history of bowel resection, colostomy with reversal x2, rectal tumor, hypertension, GERD, recurrent C. difficile.  Patient presented secondary to fever and confusion to an outside ED and was found to have evidence of a perirectal abscess, meeting sepsis criteria. She was transferred to [facility]. General surgery consulted and recommend IR evaluation for aspiration.   Assessment and Plan:  Sepsis Diagnosis on admission from outside hospital. Secondary to perirectal abscess. Blood cultures (12/16) obtained at outside hospital. Called outside hospital (12/18) and blood cultures are no growth to date as of today; urine culture with multiple organisms. WBC improved to 11,600 today. -Continue antibiotics for perirectal abscess -Follow-up blood cultures   Perirectal abscess Possibly related to recent biopsy, although confusion as to if this was a biopsy vs stool collection. CT imaging at outside hospital significant for perirectal abscess within the posterior mesorectal space and a tiny collection of pneumoperitoneum in setting of partial rectal prolapse. Unclear what antibiotics were given at outside hospital. -Zosyn IV -General surgery recommendations: IR consult for aspiration   C. Difficile colitis Present on admission. Patient with inadequate treatment secondary to medication non-adherence from intolerance. Stool testing from outside hospital shows negative C. Difficile testing, but patient continues to have symptoms with watery stools. Discussed with ID with recommendation to stop treatment, however can re-test for C. Difficile at this time. -Continue fidaxomicin -C. Difficile testing   Acute metabolic encephalopathy Secondary to acute illness and fever. Also appears to have some toxic encephalopathy from  dilaudid per family history. Appears to be resolved.   Abnormal urinalysis Asymptomatic. Urine sample was not very clean. Urine culture was obtained and is pending.   Euvolemic hyponatremia Osmolality low. Sodium of 132. Likely related to fluid loss from diarrhea. Resolved.  Hypokalemia Mild. -Potassium supplementation   Essential tremor -Continue propranolol   GERD -Continue famotidine   DVT prophylaxis: SCDs Code Status:   Code Status: Full Code Family Communication: Sister at bedside Disposition Plan: Discharge home likely in 1-2 days pending transition to outpatient antibiotic regimen and culture data   Consultants:  General surgery Interventional radiology Infectious disease  Procedures:  None  Antimicrobials: Zosyn IV    Subjective: No specific concerns this morning.  Objective: BP 125/88 (BP Location: Right Arm)   Pulse 73   Temp 98.4 F (36.9 C) (Oral)   Resp 17   Ht 5\' 3"  (1.6 m)   Wt 83.2 kg   SpO2 96%   BMI 32.49 kg/m   Examination:  General exam: Appears calm and comfortable Respiratory system: Clear to auscultation. Respiratory effort normal. Cardiovascular system: S1 & S2 heard, RRR. No murmurs, rubs, gallops or clicks. Gastrointestinal system: Abdomen is nondistended, soft and nontender. Normal bowel sounds heard. Central nervous system: Alert and oriented. No focal neurological deficits. Musculoskeletal: No edema. No calf tenderness Psychiatry: Judgement and insight appear normal. Mood & affect appropriate.    Data Reviewed: I have personally reviewed following labs and imaging studies  CBC Lab Results  Component Value Date   WBC 11.6 (H) 08/18/2023   RBC 3.80 (L) 08/18/2023   HGB 11.1 (L) 08/18/2023   HCT 34.2 (L) 08/18/2023   MCV 90.0 08/18/2023   MCH 29.2 08/18/2023   PLT 254 08/18/2023   MCHC 32.5 08/18/2023   RDW 12.7 08/18/2023   LYMPHSABS 2.1 06/16/2023  MONOABS 0.7 06/16/2023   EOSABS 0.2 06/16/2023   BASOSABS  0.0 06/16/2023     Last metabolic panel Lab Results  Component Value Date   NA 140 08/18/2023   K 3.4 (L) 08/18/2023   CL 105 08/18/2023   CO2 24 08/18/2023   BUN 8 08/18/2023   CREATININE 0.90 08/18/2023   GLUCOSE 91 08/18/2023   GFRNONAA >60 08/18/2023   GFRAA >60 10/27/2016   CALCIUM 8.8 (L) 08/18/2023   PROT 6.0 (L) 08/18/2023   ALBUMIN 3.0 (L) 08/18/2023   BILITOT 1.1 08/18/2023   ALKPHOS 67 08/18/2023   AST 29 08/18/2023   ALT 30 08/18/2023   ANIONGAP 11 08/18/2023    GFR: Estimated Creatinine Clearance: 62 mL/min (by C-G formula based on SCr of 0.9 mg/dL).  No results found for this or any previous visit (from the past 240 hours).    Radiology Studies: No results found.    LOS: 1 day    Jacquelin Hawking, MD [facility] 08/18/2023, 7:42 AM   If 7PM-7AM, please contact night-coverage [email or web address]
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
## 2023-08-19 DIAGNOSIS — K529 Noninfective gastroenteritis and colitis, unspecified: Secondary | ICD-10-CM | POA: Diagnosis not present

## 2023-08-19 DIAGNOSIS — K611 Rectal abscess: Principal | ICD-10-CM | POA: Insufficient documentation

## 2023-08-19 DIAGNOSIS — Z85048 Personal history of other malignant neoplasm of rectum, rectosigmoid junction, and anus: Secondary | ICD-10-CM | POA: Diagnosis not present

## 2023-08-19 MED ORDER — OXYBUTYNIN CHLORIDE 5 MG PO TABS
5.0000 mg | ORAL_TABLET | Freq: Three times a day (TID) | ORAL | Status: DC | PRN
  Administered 2023-08-19 – 2023-08-20 (×2): 5 mg via ORAL
  Filled 2023-08-19 (×3): qty 1

## 2023-08-19 MED ORDER — SACCHAROMYCES BOULARDII 250 MG PO CAPS
250.0000 mg | ORAL_CAPSULE | Freq: Two times a day (BID) | ORAL | Status: DC
  Administered 2023-08-19 – 2023-08-23 (×9): 250 mg via ORAL
  Filled 2023-08-19 (×10): qty 1

## 2023-08-19 MED ORDER — PSYLLIUM 95 % PO PACK
1.0000 | PACK | Freq: Two times a day (BID) | ORAL | Status: DC
  Administered 2023-08-19 – 2023-08-21 (×5): 1 via ORAL
  Filled 2023-08-19 (×10): qty 1

## 2023-08-19 NOTE — Plan of Care (Signed)
  Problem: Health Behavior/Discharge Planning: Goal: Ability to manage health-related needs will improve Outcome: Progressing   Problem: Clinical Measurements: Goal: Ability to maintain clinical measurements within normal limits will improve Outcome: Progressing Goal: Will remain free from infection Outcome: Progressing Goal: Diagnostic test results will improve Outcome: Progressing Goal: Respiratory complications will improve Outcome: Progressing Goal: Cardiovascular complication will be avoided Outcome: Progressing   Problem: Activity: Goal: Risk for activity intolerance will decrease Outcome: Progressing   Problem: Coping: Goal: Level of anxiety will decrease Outcome: Progressing   Problem: Nutrition: Goal: Adequate nutrition will be maintained Outcome: Progressing   Problem: Elimination: Goal: Will not experience complications related to bowel motility Outcome: Progressing Goal: Will not experience complications related to urinary retention Outcome: Progressing

## 2023-08-19 NOTE — Progress Notes (Signed)
PROGRESS NOTE [name]  QMV:[id number] DOB: [birth year] DOA: 08/17/2023 PCP: Clayborne Dana, NP  Brief Narrative/Hospital Course: [name] is a 67 y.o. female with a history of bowel resection, colostomy with reversal x2, rectal tumor, hypertension, GERD, recurrent C. difficile.  Patient presented secondary to fever and confusion to an outside ED and was found to have evidence of a perirectal abscess, meeting sepsis criteria. She was transferred to [facility]. General surgery consulted and recommend IR evaluation for aspiration. 12/18> CT-guided drainage catheter placed and presacral abscess. ID following.     Subjective: Seen and examined Sister at bedside Feels much improved, drain with pus like fuild. Overnight afebrile BP stable Drain output 8 cc  Assessment and Plan: Principal Problem:   Perirectal abscess Active Problems:   Rectal cancer s/p redo robotic LAR with coloanal anastomosis 07/01/2016  Perirectal abscess poa History of colorectal cancer status post partial colectomy Chronic abdominal pain: Does not meet sepsis criteria on admission here. Reportedly febrile 101 on presentation at [facility]-possibly sepsis on presentation at [facility].S/p CT-guided drainage catheter placed 12/18, ID, IR and general surgery following. Feels much improved, drain with pus like fuild. Drain sample gram stain -Gram variable rods, follow-up culture and blood culture.  Continue on Zosyn.  Acute metabolic encephalopathy: Currently stable at baseline. Likely secondary to acute illness and fever and ?  Dilaudid. Avoid opiates/benzos. Keep on delirium precautions.   Chronic diarrhea: ~ 30x/daily since her bowel surgery C. difficile testing negative, off antibiotics, continue supportive care. She thiks she was able to hold through the night, only went 5 times. Per ID Deficid discontinued  Hypokalemia Hyponatremia: Monitor electrolytes and replace.  Abnormal UA: Asymptomatic.  Follow-up  culture  Essential tremor: On propranolol  GERD: On Pepcid   Class I Obesity: Patient's Body mass index is 32.49 kg/m. : Will benefit with PCP follow-up, weight loss  healthy lifestyle and outpatient sleep evaluation.   DVT prophylaxis: SCDs Start: 08/17/23 1642 Code Status:   Code Status: Full Code Family Communication: plan of care discussed with patient/sister at bedside. Patient status is: Remains hospitalized because of severity of illness Level of care: Med-Surg   Dispo: The patient is from: home w/ husband independent            Anticipated disposition: TBD Objective: Vitals last 24 hrs: Vitals:   08/18/23 2018 08/18/23 2324 08/19/23 0335 08/19/23 0753  BP: (!) 147/68 136/71 104/64 122/73  Pulse: 64 69 76 66  Resp: 14 15 17 16   Temp: 98.4 F (36.9 C) 99.5 F (37.5 C) 98.9 F (37.2 C) 98.5 F (36.9 C)  TempSrc: Oral Oral Oral Oral  SpO2: 95% 96% 94% 94%  Weight:      Height:       Weight change:   Physical Examination: General exam: alert awake,at baseline, older than stated age HEENT:Oral mucosa moist, Ear/Nose WNL grossly Respiratory system: Bilaterally clear BS,no use of accessory muscle Cardiovascular system: S1 & S2 +, No JVD. Gastrointestinal system: Abdomen soft,NT,ND, BS+ Nervous System: Alert, awake, moving all extremities,and following commands. Extremities: LE edema neg,distal peripheral pulses palpable and warm.  Skin: No rashes,no icterus. MSK: Normal muscle bulk,tone, power  Perirectal drain +  Medications reviewed:  Scheduled Meds:  famotidine  20 mg Oral Daily   feeding supplement  237 mL Oral BID BM   hydrocortisone  1 Application Rectal BID   influenza vaccine adjuvanted  0.5 mL Intramuscular Tomorrow-1000   propranolol  10 mg Oral TID   sodium chloride  flush  5 mL Intracatheter Q8H  Continuous Infusions:  piperacillin-tazobactam (ZOSYN)  IV 3.375 g (08/19/23 0355)    Diet Order             Diet regular Room service  appropriate? Yes; Fluid consistency: Thin  Diet effective now                  Intake/Output Summary (Last 24 hours) at 08/19/2023 1015 Last data filed at 08/19/2023 0622 Gross per 24 hour  Intake 425.31 ml  Output 8 ml  Net 417.31 ml  Net IO Since Admission: 537.31 mL [08/19/23 1015]  Wt Readings from Last 3 Encounters:  08/17/23 83.2 kg  06/16/23 83.3 kg  12/15/22 86.2 kg   Unresulted Labs (From admission, onward)     Start     Ordered   08/20/23 0500  Basic metabolic panel  Daily,   R     Question:  Specimen collection method  Answer:  Lab=Lab collect   08/19/23 0802   08/20/23 0500  CBC  Daily,   R     Question:  Specimen collection method  Answer:  Lab=Lab collect   08/19/23 0802          Data Reviewed: I have personally reviewed following labs and imaging studies CBC: Recent Labs  Lab 08/18/23 0554  WBC 11.6*  HGB 11.1*  HCT 34.2*  MCV 90.0  PLT 254  Basic Metabolic Panel:  Recent Labs  Lab 08/18/23 0554  NA 140  K 3.4*  CL 105  CO2 24  GLUCOSE 91  BUN 8  CREATININE 0.90  CALCIUM 8.8*   GFR: Estimated Creatinine Clearance: 62 mL/min (by C-G formula based on SCr of 0.9 mg/dL). Liver Function Tests:  Recent Labs  Lab 08/18/23 0554  AST 29  ALT 30  ALKPHOS 67  BILITOT 1.1  PROT 6.0*  ALBUMIN 3.0*  Sepsis Labs: No results for input(s): "PROCALCITON", "LATICACIDVEN" in the last 168 hours. Recent Results (from the past 240 hours)  C Difficile Quick Screen w PCR reflex     Status: None   Collection Time: 08/18/23 12:07 PM   Specimen: STOOL  Result Value Ref Range Status   C Diff antigen NEGATIVE NEGATIVE Final   C Diff toxin NEGATIVE NEGATIVE Final   C Diff interpretation No C. difficile detected.  Final    Comment: Performed at [facility], [address] [place], [place] [address]  Aerobic/Anaerobic Culture w Gram Stain (surgical/deep wound)     Status: None (Preliminary result)   Collection Time: 08/18/23 12:24 PM   Specimen:  Abscess  Result Value Ref Range Status   Specimen Description ABSCESS  Final   Special Requests PRESACRAL  Final   Gram Stain   Final    MODERATE WBC PRESENT,BOTH PMN AND MONONUCLEAR FEW GRAM VARIABLE ROD    Culture   Final    TOO YOUNG TO READ Performed at [facility], [address] [place], [place] [address]    Report Status PENDING  Incomplete    Antimicrobials/Microbiology: Anti-infectives (From admission, onward)    Start     Dose/Rate Route Frequency Ordered Stop   08/18/23 0400  piperacillin-tazobactam (ZOSYN) IVPB 3.375 g        3.375 g 12.5 mL/hr over 240 Minutes Intravenous Every 8 hours 08/17/23 2007     08/17/23 2200  fidaxomicin (DIFICID) tablet 200 mg  Status:  Discontinued        200 mg Oral 2  times daily 08/17/23 1644 08/18/23 1229   08/17/23 1900  piperacillin-tazobactam (ZOSYN) IVPB 3.375 g        3.375 g 100 mL/hr over 30 Minutes Intravenous  Once 08/17/23 1803 08/18/23 0929         Component Value Date/Time   SDES ABSCESS 08/18/2023 1224   SPECREQUEST PRESACRAL 08/18/2023 1224   CULT  08/18/2023 1224    TOO YOUNG TO READ Performed at [facility], [address] [place], [place] [address]    REPTSTATUS PENDING 08/18/2023 1224     Radiology Studies: CT GUIDED PERITONEAL/RETROPERITONEAL FLUID DRAIN BY PERC CATH Result Date: 08/18/2023 INDICATION: 67 year old with a complex surgical history including colo-anal anastomosis. Patient has a presacral/perirectal abscess. Request for percutaneous drainage. EXAM: CT-GUIDED PLACEMENT OF DRAINAGE CATHETER IN PELVIC ABSCESS MEDICATIONS: Moderate sedation ANESTHESIA/SEDATION: Moderate (conscious) sedation was employed during this procedure. A total of Versed 2mg  and fentanyl 100 mcg was administered intravenously at the order of the provider performing the procedure. Total intra-service moderate sedation time: 28 minutes. Patient's level of consciousness and vital signs were monitored continuously by  radiology nurse throughout the procedure under the supervision of the provider performing the procedure. COMPLICATIONS: None immediate. PROCEDURE: Informed written consent was obtained from the patient after a thorough discussion of the procedural risks, benefits and alternatives. All questions were addressed. Maximal Sterile Barrier Technique was utilized including caps, mask, sterile gowns, sterile gloves, sterile drape, hand hygiene and skin antiseptic. A timeout was performed prior to the initiation of the procedure. Patient was placed prone. CT images of the pelvis were obtained. The air-fluid collection in the presacral region was identified and targeted. The right buttock was prepped with chlorhexidine and sterile field was created. Skin was anesthetized using 1% lidocaine. A small incision was made. Using CT guidance, an 18 gauge trocar needle was directed into the presacral air-fluid collection. Purulent fluid was aspirated. Superstiff Amplatz wire was placed. The tract was dilated to accommodate a 10 [place] multipurpose drain. 3 mL of brown/yellow purulent fluid was aspirated along with air. Drain was flushed with saline and attached to a gravity bag. Drain was sutured to the skin. Dressing was placed. RADIATION DOSE REDUCTION: This exam was performed according to the departmental dose-optimization program which includes automated exposure control, adjustment of the mA and/or kV according to patient size and/or use of iterative reconstruction technique. FINDINGS: Air-fluid collection in the presacral region. 10 French drain was placed within the collection and 3 mL of purulent fluid was removed. Collection was partially decompressed at the end of the procedure. IMPRESSION: CT-guided placement of a drainage catheter in the presacral abscess. Electronically Signed   By: Richarda Overlie M.D.   On: 08/18/2023 13:58    LOS: 2 days   Total time spent in review of labs and imaging, patient evaluation, formulation  of plan, documentation and communication with family: 35  minutes  Lanae Boast, MD [facility]  08/19/2023, 10:15 AM

## 2023-08-19 NOTE — Progress Notes (Signed)
      Chief Complaint/Subjective: Dain procedure yesterday, diarrhea intense overnight  Objective: Vital signs in last 24 hours: Temp:  [97.4 F (36.3 C)-99.5 F (37.5 C)] 98.5 F (36.9 C) (12/19 0753) Pulse Rate:  [50-76] 66 (12/19 0753) Resp:  [12-19] 16 (12/19 0753) BP: (104-151)/(54-99) 122/73 (12/19 0753) SpO2:  [94 %-100 %] 94 % (12/19 0753) Last BM Date : 08/18/23 Intake/Output from previous day: 12/18 0701 - 12/19 0700 In: 425.3 [P.O.:360; IV Piggyback:60.3] Out: 8 [Drains:8]  PE: Gen: NAd Resp: nonlabored Card: RRR Abd: soft, NT Back: drain with purulent output  Lab Results:  Recent Labs    08/18/23 0554  WBC 11.6*  HGB 11.1*  HCT 34.2*  PLT 254   Recent Labs    08/18/23 0554  NA 140  K 3.4*  CL 105  CO2 24  GLUCOSE 91  BUN 8  CREATININE 0.90  CALCIUM 8.8*   Recent Labs    08/18/23 1437  LABPROT 14.7  INR 1.1      Component Value Date/Time   NA 140 08/18/2023 0554   NA 141 11/18/2016 0820   K 3.4 (L) 08/18/2023 0554   K 4.2 11/18/2016 0820   CL 105 08/18/2023 0554   CO2 24 08/18/2023 0554   CO2 26 11/18/2016 0820   GLUCOSE 91 08/18/2023 0554   GLUCOSE 96 11/18/2016 0820   BUN 8 08/18/2023 0554   BUN 18.6 11/18/2016 0820   CREATININE 0.90 08/18/2023 0554   CREATININE 0.59 03/07/2019 1005   CREATININE 0.8 11/18/2016 0820   CALCIUM 8.8 (L) 08/18/2023 0554   CALCIUM 9.6 11/18/2016 0820   PROT 6.0 (L) 08/18/2023 0554   PROT 7.0 11/18/2016 0820   ALBUMIN 3.0 (L) 08/18/2023 0554   ALBUMIN 3.2 (L) 11/18/2016 0820   AST 29 08/18/2023 0554   AST 12 11/18/2016 0820   ALT 30 08/18/2023 0554   ALT 10 11/18/2016 0820   ALKPHOS 67 08/18/2023 0554   ALKPHOS 60 11/18/2016 0820   BILITOT 1.1 08/18/2023 0554   BILITOT 0.78 11/18/2016 0820   GFRNONAA >60 08/18/2023 0554   GFRAA >60 10/27/2016 0431    Assessment/Plan High peri-rectal (colo) abscess. She has a complex history involving LAR for mass, redo LAR with colo-anal anastomosis with  chronic diarrhea who is being treated for c diff colitis and was found to have an abscess between the large intestine and the sacrum.   C diff colitis - toxin negative, so carrier status Pericolonic pelvic abscess - IR drainage 12/18, zosyn   FEN - reg diet, will add metamucil to see if bulking helps diarrhea urgency VTE - lovenox ID - zosyn 12/17-->  Disposition - inpatient   LOS: 2 days   I reviewed last 24 h vitals and pain scores, last 48 h intake and output, last 24 h labs and trends, and last 24 h imaging results.  This care required moderate level of medical decision making.   De Blanch Kerlan [facility] Surgery at [facility] 08/19/2023, 11:25 AM Please see Amion for pager number during day hours 7:00am-4:30pm or 7:00am -11:30am on weekends

## 2023-08-19 NOTE — Progress Notes (Signed)
[facility]  Date of Admission:  08/17/2023     Lines:  Peripheral iv   Abx: 12/17-c piptazo   12/17-12/18 fidaxomycin                                                             Assessment: 67 y.o. female hx colon cancer (rectal) s/p partial colectomy at the rectal sigmoid area, gerd, recent outside hospital admission 06/11/23 for small bowel obstruction and ct finding proctitis given iv abx in hospital and had spontaneous resolution sbo, hx cdiff colitis (?first episode 08/05/23), here for chronic abd pain now with fever and imaging showed presacral abscess      She has chronic diarrhea and per family report not sure if this has changed. She does have fluctuating formed stool as well The main difference is the the new lower abd pain and proctal pain and along with fever now is explained better by the perirectal abscess   12/6 gi clinic cdiff pcr positive but no eia toxin testing done at that time. And per family no difference in diarrhea with 10 day PO vancomycin   ?12/16 [facility] eia testing was negative per report from primary team   Mild wbc elevation also likely due to rectal abscess     Clinically at this time doesn't appear to be cdiff      ----------------- 08/19/23 id assessment Cdiff eia negative here Diarrhea stable S/p ir drain placement -- significant purulent output visualized afebrile     Plan: Continue piptazo F/u ir drain culture from abscess Pending final cx will see if she can discharge on oral abx Discussed with primary team   Principal Problem:   Perirectal abscess Active Problems:   Rectal cancer s/p redo robotic LAR with coloanal anastomosis 07/01/2016   Allergies  Allergen Reactions   Nitrofurantoin Monohyd Macro Anaphylaxis    Throat swelling    Iodinated Contrast Media Hives    Oral CT contrast-chalky on 06-01-16 Face turned red, hot, dizzy   Morphine Other (See Comments)     Hallucinations   Metronidazole Nausea Only    Scheduled Meds:  famotidine  20 mg Oral Daily   feeding supplement  237 mL Oral BID BM   hydrocortisone  1 Application Rectal BID   influenza vaccine adjuvanted  0.5 mL Intramuscular Tomorrow-1000   propranolol  10 mg Oral TID   psyllium  1 packet Oral BID   saccharomyces boulardii  250 mg Oral BID   sodium chloride flush  5 mL Intracatheter Q8H   Continuous Infusions:  piperacillin-tazobactam (ZOSYN)  IV 3.375 g (08/19/23 1154)   PRN Meds:.acetaminophen **OR** acetaminophen, HYDROcodone-acetaminophen, lidocaine, ondansetron (ZOFRAN) IV, mouth rinse, oxybutynin   SUBJECTIVE: Doing well post ir drain placement Afebrile Stable abd pain/perirectal pain   Review of Systems: ROS All other ROS was negative, except mentioned above     OBJECTIVE: Vitals:   08/18/23 2324 08/19/23 0335 08/19/23 0753 08/19/23 1155  BP: 136/71 104/64 122/73 (!) 92/59  Pulse: 69 76 66 63  Resp: 15 17 16 16   Temp: 99.5 F (37.5 C) 98.9 F (37.2 C) 98.5 F (36.9 C) 97.6 F (36.4 C)  TempSrc: Oral Oral Oral Oral  SpO2: 96% 94% 94% 98%  Weight:  Height:       Body mass index is 32.49 kg/m.  Physical Exam General/constitutional: no distress, pleasant HEENT: Normocephalic, PER, Conj Clear, EOMI, Oropharynx clear Neck supple CV: rrr no mrg Lungs: clear to auscultation, normal respiratory effort Abd: Soft, Nontender; perc drain showing purulent brown liquid Neuro: nonfocal Skin: no rash  Lab Results Lab Results  Component Value Date   WBC 11.6 (H) 08/18/2023   HGB 11.1 (L) 08/18/2023   HCT 34.2 (L) 08/18/2023   MCV 90.0 08/18/2023   PLT 254 08/18/2023    Lab Results  Component Value Date   CREATININE 0.90 08/18/2023   BUN 8 08/18/2023   NA 140 08/18/2023   K 3.4 (L) 08/18/2023   CL 105 08/18/2023   CO2 24 08/18/2023    Lab Results  Component Value Date   ALT 30 08/18/2023   AST 29 08/18/2023   ALKPHOS 67 08/18/2023    BILITOT 1.1 08/18/2023      Microbiology: Recent Results (from the past 240 hours)  C Difficile Quick Screen w PCR reflex     Status: None   Collection Time: 08/18/23 12:07 PM   Specimen: STOOL  Result Value Ref Range Status   C Diff antigen NEGATIVE NEGATIVE Final   C Diff toxin NEGATIVE NEGATIVE Final   C Diff interpretation No C. difficile detected.  Final    Comment: Performed at [facility], [address], [place], [place] [address]  Aerobic/Anaerobic Culture w Gram Stain (surgical/deep wound)     Status: None (Preliminary result)   Collection Time: 08/18/23 12:24 PM   Specimen: Abscess  Result Value Ref Range Status   Specimen Description ABSCESS  Final   Special Requests PRESACRAL  Final   Gram Stain   Final    MODERATE WBC PRESENT,BOTH PMN AND MONONUCLEAR FEW GRAM VARIABLE ROD    Culture   Final    TOO YOUNG TO READ Performed at [facility], [address], [place], [place] [address]    Report Status PENDING  Incomplete     Serology:   Imaging: If present, new imagings (plain films, ct scans, and mri) have been personally visualized and interpreted; radiology reports have been reviewed. Decision making incorporated into the Impression / Recommendations.   Raymondo Band, MD [facility] [phone] pager    08/19/2023, 1:51 PM

## 2023-08-19 NOTE — Progress Notes (Signed)
Referring Physician(s): Gentry,Marcus  Supervising Physician: Marliss Coots  Patient Status:  [facility] - In-pt  Chief Complaint: Intra-abdominal abscess s/p left transgluteal drain placement 08/18/23  Subjective: Patient resting comfortably in bed. Her sister is in the room. No apparent discomfort or distress observed.   Allergies: Nitrofurantoin monohyd macro, Iodinated contrast media, Morphine, and Metronidazole  Medications: Prior to Admission medications   Medication Sig Start Date End Date Taking? Authorizing Provider  Ascorbic Acid 100 MG CHEW Chew 1 tablet by mouth daily.   Yes [provider]  b complex vitamins capsule Take 1 capsule by mouth daily.   Yes [provider]  Calcium Carb-Cholecalciferol 500-15 MG-MCG TABS Take 1 tablet by mouth daily.   Yes [provider]  cholecalciferol (VITAMIN D3) 10 MCG (400 UNIT) TABS tablet Take 400 Units by mouth daily.   Yes [provider]  Collagen Hydrolysate POWD 1 Dose daily.   Yes [provider]  famotidine-calcium carbonate-magnesium hydroxide (PEPCID COMPLETE) 10-800-165 MG chewable tablet Chew 1 tablet by mouth daily.   Yes [provider]  HYDROcodone-acetaminophen (NORCO/VICODIN) 5-325 MG tablet Take 1 tablet by mouth daily. 06/24/23  Yes [provider]  hydrocortisone (ANUSOL-HC) 2.5 % rectal cream Place 1 Application rectally 2 (two) times daily. 08/16/23 08/15/24 Yes [provider]  Ibuprofen-Acetaminophen 125-250 MG TABS Take 2 tablets by mouth daily.   Yes [provider]  lidocaine (XYLOCAINE) 2 % jelly Apply 1 Application topically daily. 10/06/22  Yes [provider]  propranolol (INDERAL) 10 MG tablet Take 10 mg by mouth 3 (three) times daily. 04/20/23  Yes [provider]  vancomycin (VANCOCIN) 125 MG capsule Take 125 mg by mouth 4 (four) times daily. 08/05/23  Yes [provider]  ondansetron (ZOFRAN) 4 MG  tablet Take 4 mg by mouth every 8 (eight) hours as needed. Patient not taking: Reported on 08/17/2023 10/14/22   [provider]     Vital Signs: BP (!) 92/59 (BP Location: Right Arm)   Pulse 63   Temp 97.6 F (36.4 C) (Oral)   Resp 16   Ht 5\' 3"  (1.6 m)   Wt 183 lb 6.8 oz (83.2 kg)   SpO2 98%   BMI 32.49 kg/m   Physical Exam Constitutional:      General: She is not in acute distress.    Appearance: She is not ill-appearing.  Pulmonary:     Effort: Pulmonary effort is normal.  Abdominal:     Comments: Right transgluteal drain to gravity. Approximately 40 ml of feculent output in bag. Drain easily flushed with 5 ml NS. Dressing is clean/dry/intact.   Skin:    General: Skin is warm and dry.  Neurological:     Mental Status: She is alert and oriented to person, place, and time.     Imaging: CT GUIDED PERITONEAL/RETROPERITONEAL FLUID DRAIN BY PERC CATH Result Date: 08/18/2023 INDICATION: 67 year old with a complex surgical history including colo-anal anastomosis. Patient has a presacral/perirectal abscess. Request for percutaneous drainage. EXAM: CT-GUIDED PLACEMENT OF DRAINAGE CATHETER IN PELVIC ABSCESS MEDICATIONS: Moderate sedation ANESTHESIA/SEDATION: Moderate (conscious) sedation was employed during this procedure. A total of Versed 2mg  and fentanyl 100 mcg was administered intravenously at the order of the provider performing the procedure. Total intra-service moderate sedation time: 28 minutes. Patient's level of consciousness and vital signs were monitored continuously by radiology nurse throughout the procedure under the supervision of the provider performing the procedure. COMPLICATIONS: None immediate. PROCEDURE: Informed written consent was obtained from  the patient after a thorough discussion of the procedural risks, benefits and alternatives. All questions were addressed. Maximal Sterile Barrier Technique was utilized including caps, mask, sterile gowns, sterile  gloves, sterile drape, hand hygiene and skin antiseptic. A timeout was performed prior to the initiation of the procedure. Patient was placed prone. CT images of the pelvis were obtained. The air-fluid collection in the presacral region was identified and targeted. The right buttock was prepped with chlorhexidine and sterile field was created. Skin was anesthetized using 1% lidocaine. A small incision was made. Using CT guidance, an 18 gauge trocar needle was directed into the presacral air-fluid collection. Purulent fluid was aspirated. Superstiff Amplatz wire was placed. The tract was dilated to accommodate a 10 [place] multipurpose drain. 3 mL of brown/yellow purulent fluid was aspirated along with air. Drain was flushed with saline and attached to a gravity bag. Drain was sutured to the skin. Dressing was placed. RADIATION DOSE REDUCTION: This exam was performed according to the departmental dose-optimization program which includes automated exposure control, adjustment of the mA and/or kV according to patient size and/or use of iterative reconstruction technique. FINDINGS: Air-fluid collection in the presacral region. 10 French drain was placed within the collection and 3 mL of purulent fluid was removed. Collection was partially decompressed at the end of the procedure. IMPRESSION: CT-guided placement of a drainage catheter in the presacral abscess. Electronically Signed   By: Richarda Overlie M.D.   On: 08/18/2023 13:58    Labs:  CBC: Recent Labs    11/16/22 1145 06/16/23 0931 08/18/23 0554  WBC 9.9 7.9 11.6*  HGB 12.6 12.7 11.1*  HCT 38.2 39.5 34.2*  PLT 340.0 305.0 254    COAGS: Recent Labs    08/18/23 1437  INR 1.1    BMP: Recent Labs    11/16/22 1145 06/16/23 0931 08/18/23 0554  NA 143 143 140  K 3.8 3.8 3.4*  CL 107 107 105  CO2 26 27 24   GLUCOSE 86 88 91  BUN 27* 16 8  CALCIUM 9.7 9.4 8.8*  CREATININE 0.69 0.74 0.90  GFRNONAA  --   --  >60    LIVER FUNCTION  TESTS: Recent Labs    11/16/22 1145 06/16/23 0931 08/18/23 0554  BILITOT 0.5 0.7 1.1  AST 14 22 29   ALT 11 23 30   ALKPHOS 60 60 67  PROT 6.6 6.4 6.0*  ALBUMIN 3.9 3.9 3.0*    Assessment and Plan:  Intra-abdominal abscess s/p left transgluteal drain placement 08/18/23  Patient is afebrile and is feeling ok today. Drain care teaching and follow up instructions discussed with the patient and her sister. The sister was shown how to flush the drain. They are unsure of discharge plans at this time.   Drain Location: Left transgluteal  Size: Fr size: 10 Fr Date of placement: 08/18/23 Currently to: Drain collection device: suction bulb 24 hour output:  Output by Drain (mL) 08/17/23 0700 - 08/17/23 1459 08/17/23 1500 - 08/17/23 2259 08/17/23 2300 - 08/18/23 0659 08/18/23 0700 - 08/18/23 1459 08/18/23 1500 - 08/18/23 2259 08/18/23 2300 - 08/19/23 0659 08/19/23 0700 - 08/19/23 1244  Closed System Drain 1 Right Buttock Other (Comment) 10 Fr.      8 40    Interval imaging/drain manipulation:  None  Current examination: Flushes/aspirates easily.  Insertion site unremarkable. Suture and stat lock in place. Dressed appropriately.   Plan: Continue TID flushes with 5 cc NS. Record output Q shift. Dressing changes QD or PRN if  soiled.  Call IR APP or on call IR MD if difficulty flushing or sudden change in drain output.  Repeat imaging/possible drain injection once output < 10 mL/QD (excluding flush material). Consideration for drain removal if output is < 10 mL/QD (excluding flush material), pending discussion with the providing surgical service.  Discharge planning: Please contact IR APP or on call IR MD prior to patient d/c to ensure appropriate follow up plans are in place. Typically patient will follow up with IR clinic 10-14 days post d/c for repeat imaging/possible drain injection. IR scheduler will contact patient with date/time of appointment. Patient will need to flush drain QD  with 5 cc NS, record output QD, dressing changes every 2-3 days or earlier if soiled.   IR will continue to follow - please call with questions or concerns.  Electronically Signed: Alwyn Ren, AGACNP-BC [phone] 08/19/2023, 12:42 PM   I spent a total of 15 Minutes at the the patient's bedside AND on the patient's hospital floor or unit, greater than 50% of which was counseling/coordinating care for intra-abdominal abscess drain.

## 2023-08-20 DIAGNOSIS — K611 Rectal abscess: Secondary | ICD-10-CM | POA: Diagnosis not present

## 2023-08-20 LAB — BASIC METABOLIC PANEL
Anion gap: 10 (ref 5–15)
BUN: 11 mg/dL (ref 8–23)
CO2: 23 mmol/L (ref 22–32)
Calcium: 8.7 mg/dL — ABNORMAL LOW (ref 8.9–10.3)
Chloride: 103 mmol/L (ref 98–111)
Creatinine, Ser: 0.68 mg/dL (ref 0.44–1.00)
GFR, Estimated: >60 mL/min (ref >60)
Glucose, Bld: 107 mg/dL — ABNORMAL HIGH (ref 70–99)
Potassium: 3.4 mmol/L — ABNORMAL LOW (ref 3.5–5.1)
Sodium: 136 mmol/L (ref 135–145)

## 2023-08-20 LAB — CBC
HCT: 32.2 % — ABNORMAL LOW (ref 36.0–46.0)
Hemoglobin: 10.7 g/dL — ABNORMAL LOW (ref 12.0–15.0)
MCH: 29.1 pg (ref 26.0–34.0)
MCHC: 33.2 g/dL (ref 30.0–36.0)
MCV: 87.5 fL (ref 80.0–100.0)
Platelets: 276 10*3/uL (ref 150–400)
RBC: 3.68 MIL/uL — ABNORMAL LOW (ref 3.87–5.11)
RDW: 12.6 % (ref 11.5–15.5)
WBC: 11.1 10*3/uL — ABNORMAL HIGH (ref 4.0–10.5)
nRBC: 0 % (ref 0.0–0.2)

## 2023-08-20 MED ORDER — SODIUM CHLORIDE 0.9% FLUSH
5.0000 mL | Freq: Three times a day (TID) | INTRAVENOUS | Status: DC
  Administered 2023-08-21 – 2023-08-22 (×3): 5 mL

## 2023-08-20 MED ORDER — POTASSIUM CHLORIDE CRYS ER 20 MEQ PO TBCR
20.0000 meq | EXTENDED_RELEASE_TABLET | Freq: Every day | ORAL | Status: DC
  Administered 2023-08-21 – 2023-08-23 (×3): 20 meq via ORAL
  Filled 2023-08-20 (×3): qty 1

## 2023-08-20 MED ORDER — POTASSIUM CHLORIDE CRYS ER 20 MEQ PO TBCR
40.0000 meq | EXTENDED_RELEASE_TABLET | Freq: Once | ORAL | Status: AC
  Administered 2023-08-20: 40 meq via ORAL
  Filled 2023-08-20: qty 2

## 2023-08-20 MED ORDER — IBUPROFEN 200 MG PO TABS
600.0000 mg | ORAL_TABLET | Freq: Four times a day (QID) | ORAL | Status: DC | PRN
  Administered 2023-08-20: 600 mg via ORAL
  Filled 2023-08-20: qty 3

## 2023-08-20 MED ORDER — OXYCODONE HCL 5 MG PO TABS
5.0000 mg | ORAL_TABLET | Freq: Four times a day (QID) | ORAL | Status: DC | PRN
  Administered 2023-08-20 – 2023-08-23 (×9): 5 mg via ORAL
  Filled 2023-08-20 (×10): qty 1

## 2023-08-20 NOTE — Progress Notes (Signed)
PROGRESS NOTE [name]  NUU:[id number] DOB: 1955/09/12 DOA: 08/17/2023 PCP: Clayborne Dana, NP  Brief Narrative/Hospital Course: [name] is a 67 y.o. female with a history of bowel resection, colostomy with reversal x2, rectal tumor, hypertension, GERD, recurrent C. difficile.  Patient presented secondary to fever and confusion to an outside ED and was found to have evidence of a perirectal abscess, meeting sepsis criteria. She was transferred to [facility]. General surgery consulted and recommend IR evaluation for aspiration. 12/18> CT-guided drainage catheter placed and presacral abscess. ID following.    Subjective: Seen and examined Sister at bedside Having drain output and c/o pain Patient has been afebrile BP stable not hypoxic  She has been refusing telemetry Drain output 50cc with pus  Assessment and Plan: Principal Problem:   Perirectal abscess Active Problems:   Rectal cancer s/p redo robotic LAR with coloanal anastomosis 07/01/2016  Pericolonic pelvic poa History of colorectal cancer status post partial colectomy Chronic abdominal pain: Did not meet sepsis criteria on admit.Reportedly febrile 101 at [facility]-possibly sepsis on presentation at [facility]. S/p CT-guided drainage catheter placed 12/18, ID, IR and general surgery following. Having pus output from the drain-culture with gram-negative rods so far- f/u final culture to discharge on oral antibiotics regimen for home for now continue IV Zosyn per infectious disease.   Acute metabolic encephalopathy: Likely secondary to acute illness and fever and ?  Dilaudid.Resolved.Avoid opiates/benzos. Keep on delirium precautions.   Chronic diarrhea: ~30x/daily since her bowel surgery C. difficile testing negative, off antibiotics, continue supportive care. Stable at baseline Per ID Deficid discontinued  Hypokalemia Hyponatremia: Replace potassium.    Abnormal UA: Asymptomatic.  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 : Will benefit with PCP follow-up, weight loss  healthy lifestyle and outpatient sleep evaluation.   DVT prophylaxis: SCDs Start: 08/17/23 1642 Code Status:   Code Status: Full Code Family Communication: plan of care discussed with patient/sister at bedside. Patient status is: Remains hospitalized because of severity of illness Level of care: Med-Surg   Dispo: The patient is from: home w/ husband independent            Anticipated disposition: TBD Objective: Vitals last 24 hrs: Vitals:   08/19/23 1443 08/19/23 2342 08/20/23 0329 08/20/23 0743  BP: (!) 106/57 (!) 108/55 121/66 137/73  Pulse: 71 66 60 67  Resp: (!) 21 19 20 18   Temp: 98 F (36.7 C) 98.4 F (36.9 C) 98.4 F (36.9 C) 98.5 F (36.9 C)  TempSrc: Oral Oral Oral Oral  SpO2: 97% 96% 99% 99%  Weight:      Height:       Weight change:   Physical Examination: General exam: alert awake, pleasant HEENT:Oral mucosa moist, Ear/Nose WNL grossly Respiratory system: Bilaterally clear BS,no use of accessory muscle Cardiovascular system: S1 & S2 +, No JVD. Gastrointestinal system: Abdomen soft,NT,ND, BS+ Nervous System: Alert, awake, moving all extremities,and following commands. Extremities: LE edema neg,distal peripheral pulses palpable and warm.  Skin: No rashes,no icterus. MSK: Normal muscle bulk,tone, power  Drain+ w/ pus outout  Medications reviewed:  Scheduled Meds:  famotidine  20 mg Oral Daily   feeding supplement  237 mL Oral BID BM   hydrocortisone  1 Application Rectal BID   influenza vaccine adjuvanted  0.5 mL Intramuscular Tomorrow-1000   [START ON 08/21/2023] potassium chloride  20 mEq Oral Daily   propranolol  10 mg  Oral TID   psyllium  1 packet Oral BID   saccharomyces boulardii  250 mg Oral BID   sodium chloride flush  5 mL Intracatheter Q8H  Continuous Infusions:  piperacillin-tazobactam  (ZOSYN)  IV 3.375 g (08/20/23 1112)    Diet Order             Diet regular Room service appropriate? Yes; Fluid consistency: Thin  Diet effective now                  Intake/Output Summary (Last 24 hours) at 08/20/2023 1123 Last data filed at 08/20/2023 0600 Gross per 24 hour  Intake 5 ml  Output 50 ml  Net -45 ml  Net IO Since Admission: 492.31 mL [08/20/23 1123]  Wt Readings from Last 3 Encounters:  08/17/23 83.2 kg  06/16/23 83.3 kg  12/15/22 86.2 kg   Unresulted Labs (From admission, onward)     Start     Ordered   08/20/23 0500  Basic metabolic panel  Daily,   R     Question:  Specimen collection method  Answer:  Lab=Lab collect   08/19/23 0802   08/20/23 0500  CBC  Daily,   R     Question:  Specimen collection method  Answer:  Lab=Lab collect   08/19/23 0802          Data Reviewed: I have personally reviewed following labs and imaging studies CBC: Recent Labs  Lab 08/18/23 0554 08/20/23 0555  WBC 11.6* 11.1*  HGB 11.1* 10.7*  HCT 34.2* 32.2*  MCV 90.0 87.5  PLT 254 276  Basic Metabolic Panel:  Recent Labs  Lab 08/18/23 0554 08/20/23 0555  NA 140 136  K 3.4* 3.4*  CL 105 103  CO2 24 23  GLUCOSE 91 107*  BUN 8 11  CREATININE 0.90 0.68  CALCIUM 8.8* 8.7*   GFR: Estimated Creatinine Clearance: 69.7 mL/min (by C-G formula based on SCr of 0.68 mg/dL). Liver Function Tests:  Recent Labs  Lab 08/18/23 0554  AST 29  ALT 30  ALKPHOS 67  BILITOT 1.1  PROT 6.0*  ALBUMIN 3.0*  Sepsis Labs: No results for input(s): "PROCALCITON", "LATICACIDVEN" in the last 168 hours. Recent Results (from the past 240 hours)  C Difficile Quick Screen w PCR reflex     Status: None   Collection Time: 08/18/23 12:07 PM   Specimen: STOOL  Result Value Ref Range Status   C Diff antigen NEGATIVE NEGATIVE Final   C Diff toxin NEGATIVE NEGATIVE Final   C Diff interpretation No C. difficile detected.  Final    Comment: Performed at [facility], [address] [place], [place] [address]  Aerobic/Anaerobic Culture w Gram Stain (surgical/deep wound)     Status: None (Preliminary result)   Collection Time: 08/18/23 12:24 PM   Specimen: Abscess  Result Value Ref Range Status   Specimen Description ABSCESS  Final   Special Requests PRESACRAL  Final   Gram Stain   Final    MODERATE WBC PRESENT,BOTH PMN AND MONONUCLEAR FEW GRAM VARIABLE ROD Performed at [facility], [address] [place], [place] [address]    Culture   Final    MODERATE KLEBSIELLA OXYTOCA CULTURE REINCUBATED FOR BETTER GROWTH SUSCEPTIBILITIES TO FOLLOW NO ANAEROBES ISOLATED; CULTURE IN PROGRESS FOR 5 DAYS    Report Status PENDING  Incomplete    Antimicrobials/Microbiology: Anti-infectives (From admission, onward)    Start     Dose/Rate Route Frequency Ordered Stop  08/18/23 0400  piperacillin-tazobactam (ZOSYN) IVPB 3.375 g        3.375 g 12.5 mL/hr over 240 Minutes Intravenous Every 8 hours 08/17/23 2007     08/17/23 2200  fidaxomicin (DIFICID) tablet 200 mg  Status:  Discontinued        200 mg Oral 2 times daily 08/17/23 1644 08/18/23 1229   08/17/23 1900  piperacillin-tazobactam (ZOSYN) IVPB 3.375 g        3.375 g 100 mL/hr over 30 Minutes Intravenous  Once 08/17/23 1803 08/18/23 0929         Component Value Date/Time   SDES ABSCESS 08/18/2023 1224   SPECREQUEST PRESACRAL 08/18/2023 1224   CULT  08/18/2023 1224    MODERATE KLEBSIELLA OXYTOCA CULTURE REINCUBATED FOR BETTER GROWTH SUSCEPTIBILITIES TO FOLLOW NO ANAEROBES ISOLATED; CULTURE IN PROGRESS FOR 5 DAYS    REPTSTATUS PENDING 08/18/2023 1224     Radiology Studies: CT GUIDED PERITONEAL/RETROPERITONEAL FLUID DRAIN BY PERC CATH Result Date: 08/18/2023 INDICATION: 67 year old with a complex surgical history including colo-anal anastomosis. Patient has a presacral/perirectal abscess. Request for percutaneous drainage. EXAM: CT-GUIDED PLACEMENT OF DRAINAGE CATHETER IN PELVIC ABSCESS MEDICATIONS:  Moderate sedation ANESTHESIA/SEDATION: Moderate (conscious) sedation was employed during this procedure. A total of Versed 2mg  and fentanyl 100 mcg was administered intravenously at the order of the provider performing the procedure. Total intra-service moderate sedation time: 28 minutes. Patient's level of consciousness and vital signs were monitored continuously by radiology nurse throughout the procedure under the supervision of the provider performing the procedure. COMPLICATIONS: None immediate. PROCEDURE: Informed written consent was obtained from the patient after a thorough discussion of the procedural risks, benefits and alternatives. All questions were addressed. Maximal Sterile Barrier Technique was utilized including caps, mask, sterile gowns, sterile gloves, sterile drape, hand hygiene and skin antiseptic. A timeout was performed prior to the initiation of the procedure. Patient was placed prone. CT images of the pelvis were obtained. The air-fluid collection in the presacral region was identified and targeted. The right buttock was prepped with chlorhexidine and sterile field was created. Skin was anesthetized using 1% lidocaine. A small incision was made. Using CT guidance, an 18 gauge trocar needle was directed into the presacral air-fluid collection. Purulent fluid was aspirated. Superstiff Amplatz wire was placed. The tract was dilated to accommodate a 10 [place] multipurpose drain. 3 mL of brown/yellow purulent fluid was aspirated along with air. Drain was flushed with saline and attached to a gravity bag. Drain was sutured to the skin. Dressing was placed. RADIATION DOSE REDUCTION: This exam was performed according to the departmental dose-optimization program which includes automated exposure control, adjustment of the mA and/or kV according to patient size and/or use of iterative reconstruction technique. FINDINGS: Air-fluid collection in the presacral region. 10 French drain was placed within  the collection and 3 mL of purulent fluid was removed. Collection was partially decompressed at the end of the procedure. IMPRESSION: CT-guided placement of a drainage catheter in the presacral abscess. Electronically Signed   By: Richarda Overlie M.D.   On: 08/18/2023 13:58    LOS: 3 days   Total time spent in review of labs and imaging, patient evaluation, formulation of plan, documentation and communication with family: 35  minutes  Lanae Boast, MD [facility]  08/20/2023, 11:23 AM

## 2023-08-20 NOTE — Progress Notes (Signed)
Chief Complaint: Patient was seen today for intra-abdominal abscess s/p left transgluteal drain placement 08/18/23.  Referring Physician(s): Gentry,Marcus  Supervising Physician: Malachy Moan  Patient Status: [facility] - In-pt  Subjective: 12/20: Patient is laying upright in bed. Family is present at the bedside. Patient reports that she felt nausea this morning, but this resolved. Patient is tolerating foods and liquids. Endorses: rectal pain.   Objective: Physical Exam: BP 101/61   Pulse 61   Temp 98.5 F (36.9 C) (Oral)   Resp 18   Ht 5\' 3"  (1.6 m)   Wt 183 lb 6.8 oz (83.2 kg)   SpO2 99%   BMI 32.49 kg/m   Constitutional: Adult female, alert and oriented, INAD. Family present. Skin: Perirectal drain in placed and attached to a gravity bag. Output is green/brown in color. Mild TTP over drain insertion site. Clean/dry bandage in place. Head: Normocephalic, atraumatic. Chest: Adequate rise and fall of chest wall. Lung: Breathing comfortably on room air. No respiratory distress noted. Extremities: Warm and dry. No erythema, edema or cyanosis present. Neuro: Alert and oriented. Adequate insight and judgment.     Current Facility-Administered Medications:    acetaminophen (TYLENOL) tablet 650 mg, 650 mg, Oral, Q6H PRN, 650 mg at 08/20/23 1111 **OR** acetaminophen (TYLENOL) suppository 650 mg, 650 mg, Rectal, Q6H PRN, Narda Bonds, MD   famotidine (PEPCID) tablet 20 mg, 20 mg, Oral, Daily, Narda Bonds, MD, 20 mg at 08/20/23 0981   feeding supplement (ENSURE ENLIVE / ENSURE PLUS) liquid 237 mL, 237 mL, Oral, BID BM, Narda Bonds, MD, 237 mL at 08/20/23 1914   HYDROcodone-acetaminophen (NORCO/VICODIN) 5-325 MG per tablet 1-2 tablet, 1-2 tablet, Oral, Q6H PRN, Narda Bonds, MD, 2 tablet at 08/20/23 1259   hydrocortisone (ANUSOL-HC) 2.5 % rectal cream 1 Application, 1 Application, Rectal, BID, Narda Bonds, MD, 1 Application at 08/20/23 [phone]   ibuprofen (ADVIL)  tablet 600 mg, 600 mg, Oral, Q6H PRN, Juliet Rude, PA-C, 600 mg at 08/20/23 1218   influenza vaccine adjuvanted (FLUAD) injection 0.5 mL, 0.5 mL, Intramuscular, Tomorrow-1000, Nettey, Ralph A, MD   lidocaine (XYLOCAINE) 2 % jelly 1 Application, 1 Application, Topical, TID PRN, Narda Bonds, MD, 1 Application at 08/20/23 [phone]   ondansetron (ZOFRAN) injection 4 mg, 4 mg, Intravenous, Q6H PRN, Narda Bonds, MD   Oral care mouth rinse, 15 mL, Mouth Rinse, PRN, Narda Bonds, MD   oxybutynin (DITROPAN) tablet 5 mg, 5 mg, Oral, Q8H PRN, Kc, Ramesh, MD, 5 mg at 08/20/23 0546   piperacillin-tazobactam (ZOSYN) IVPB 3.375 g, 3.375 g, Intravenous, Q8H, Millen, Jessica B, RPH, Last Rate: 12.5 mL/hr at 08/20/23 1112, 3.375 g at 08/20/23 1112   [START ON 08/21/2023] potassium chloride SA (KLOR-CON M) CR tablet 20 mEq, 20 mEq, Oral, Daily, Kc, Ramesh, MD   propranolol (INDERAL) tablet 10 mg, 10 mg, Oral, TID, Narda Bonds, MD, 10 mg at 08/20/23 0916   psyllium (HYDROCIL/METAMUCIL) 1 packet, 1 packet, Oral, BID, Kinsinger, De Blanch, MD, 1 packet at 08/20/23 3086   saccharomyces boulardii (FLORASTOR) capsule 250 mg, 250 mg, Oral, BID, Kc, Ramesh, MD, 250 mg at 08/20/23 0917   sodium chloride flush (NS) 0.9 % injection 5 mL, 5 mL, Intracatheter, Q8H, Henn, Adam, MD, 5 mL at 08/20/23 1259  Labs: CBC Recent Labs    08/18/23 0554 08/20/23 0555  WBC 11.6* 11.1*  HGB 11.1* 10.7*  HCT 34.2* 32.2*  PLT 254 276   BMET Recent Labs  08/18/23 0554 08/20/23 0555  NA 140 136  K 3.4* 3.4*  CL 105 103  CO2 24 23  GLUCOSE 91 107*  BUN 8 11  CREATININE 0.90 0.68  CALCIUM 8.8* 8.7*   LFT Recent Labs    08/18/23 0554  PROT 6.0*  ALBUMIN 3.0*  AST 29  ALT 30  ALKPHOS 67  BILITOT 1.1   PT/INR Recent Labs    08/18/23 1437  LABPROT 14.7  INR 1.1     Studies/Results: No results found.  Assessment/Plan: Intra-abdominal abscess  s/p left transgluteal drain placement  08/18/23  Patient is afebrile and WBC is slowly down trending. HGB stable. Drain flushes adequately, with mild pain while flushing. Clean/dry bandages overly drain insertion site.   Drain Location: left transgluteal Size: Fr size: 10 Fr Date of placement: 08/17/13  Currently to: Drain collection device: suction bulb 24 hour output:  Output by Drain (mL) 08/18/23 0701 - 08/18/23 1900 08/18/23 1901 - 08/19/23 0700 08/19/23 0701 - 08/19/23 1900 08/19/23 1901 - 08/20/23 0700 08/20/23 0701 - 08/20/23 1340  Closed System Drain 1 Right Buttock Other (Comment) 10 Fr.  8  50     Interval imaging/drain manipulation:  none  Current examination: Flushes/aspirates easily.  Insertion site unremarkable. Suture and stat lock in place. Dressed appropriately.   Plan: Continue TID flushes with 5 cc NS. Record output Q shift. Dressing changes QD or PRN if soiled.  Call IR APP or on call IR MD if difficulty flushing or sudden change in drain output.  Repeat imaging/possible drain injection once output < 10 mL/QD (excluding flush material). Consideration for drain removal if output is < 10 mL/QD (excluding flush material), pending discussion with the providing surgical service.  Discharge planning: Please contact IR APP or on call IR MD prior to patient d/c to ensure appropriate follow up plans are in place. Typically patient will follow up with IR clinic 10-14 days post d/c for repeat imaging/possible drain injection. IR scheduler will contact patient with date/time of appointment. Patient will need to flush drain QD with 5 cc NS, record output QD, dressing changes every 2-3 days or earlier if soiled.   IR will continue to follow - please call with questions or concerns.      LOS: 3 days   I spent a total of 15 minutes in face to face in clinical consultation, greater than 50% of which was counseling/coordinating care for intra-abdominal abscess drain.   Anell Barr Shekela Goodridge PA-C 08/20/2023 1:39  PM

## 2023-08-20 NOTE — Procedures (Signed)
Interventional Radiology Procedure Note  Procedure: Placement of a 43F drain via LLQ with aspiration of bloody purulent fluid.  Complications: None  Estimated Blood Loss: None  Recommendations: - Sent for culture - Drain to JP bulb - Flush TID   Signed,  Sterling Big, MD

## 2023-08-20 NOTE — Progress Notes (Addendum)
Progress Note     Subjective: Rectal pain. Tolerating diet and having bowel function. Family at bedside and planing to help with drain care at home.   Objective: Vital signs in last 24 hours: Temp:  [97.6 F (36.4 C)-98.5 F (36.9 C)] 98.5 F (36.9 C) (12/20 0743) Pulse Rate:  [60-71] 67 (12/20 0743) Resp:  [16-21] 18 (12/20 0743) BP: (92-137)/(55-73) 137/73 (12/20 0743) SpO2:  [96 %-99 %] 99 % (12/20 0743) Last BM Date : 08/20/23  Intake/Output from previous day: 12/19 0701 - 12/20 0700 In: 5  Out: 50 [Drains:50] Intake/Output this shift: No intake/output data recorded.  PE: General: pleasant, WD, WN female who is laying in bed in NAD Lungs: Respiratory effort nonlabored Abd: soft, NT, ND, drain with purulent fluid Psych: A&Ox3 with an appropriate affect.    Lab Results:  Recent Labs    08/18/23 0554 08/20/23 0555  WBC 11.6* 11.1*  HGB 11.1* 10.7*  HCT 34.2* 32.2*  PLT 254 276   BMET Recent Labs    08/18/23 0554 08/20/23 0555  NA 140 136  K 3.4* 3.4*  CL 105 103  CO2 24 23  GLUCOSE 91 107*  BUN 8 11  CREATININE 0.90 0.68  CALCIUM 8.8* 8.7*   PT/INR Recent Labs    08/18/23 1437  LABPROT 14.7  INR 1.1   CMP     Component Value Date/Time   NA 136 08/20/2023 0555   NA 141 11/18/2016 0820   K 3.4 (L) 08/20/2023 0555   K 4.2 11/18/2016 0820   CL 103 08/20/2023 0555   CO2 23 08/20/2023 0555   CO2 26 11/18/2016 0820   GLUCOSE 107 (H) 08/20/2023 0555   GLUCOSE 96 11/18/2016 0820   BUN 11 08/20/2023 0555   BUN 18.6 11/18/2016 0820   CREATININE 0.68 08/20/2023 0555   CREATININE 0.59 03/07/2019 1005   CREATININE 0.8 11/18/2016 0820   CALCIUM 8.7 (L) 08/20/2023 0555   CALCIUM 9.6 11/18/2016 0820   PROT 6.0 (L) 08/18/2023 0554   PROT 7.0 11/18/2016 0820   ALBUMIN 3.0 (L) 08/18/2023 0554   ALBUMIN 3.2 (L) 11/18/2016 0820   AST 29 08/18/2023 0554   AST 12 11/18/2016 0820   ALT 30 08/18/2023 0554   ALT 10 11/18/2016 0820   ALKPHOS 67  08/18/2023 0554   ALKPHOS 60 11/18/2016 0820   BILITOT 1.1 08/18/2023 0554   BILITOT 0.78 11/18/2016 0820   GFRNONAA >60 08/20/2023 0555   GFRAA >60 10/27/2016 0431   Lipase  No results found for: "LIPASE"     Studies/Results: CT GUIDED PERITONEAL/RETROPERITONEAL FLUID DRAIN BY PERC CATH Result Date: 08/18/2023 INDICATION: 67 year old with a complex surgical history including colo-anal anastomosis. Patient has a presacral/perirectal abscess. Request for percutaneous drainage. EXAM: CT-GUIDED PLACEMENT OF DRAINAGE CATHETER IN PELVIC ABSCESS MEDICATIONS: Moderate sedation ANESTHESIA/SEDATION: Moderate (conscious) sedation was employed during this procedure. A total of Versed 2mg  and fentanyl 100 mcg was administered intravenously at the order of the provider performing the procedure. Total intra-service moderate sedation time: 28 minutes. Patient's level of consciousness and vital signs were monitored continuously by radiology nurse throughout the procedure under the supervision of the provider performing the procedure. COMPLICATIONS: None immediate. PROCEDURE: Informed written consent was obtained from the patient after a thorough discussion of the procedural risks, benefits and alternatives. All questions were addressed. Maximal Sterile Barrier Technique was utilized including caps, mask, sterile gowns, sterile gloves, sterile drape, hand hygiene and skin antiseptic. A timeout was performed prior to the initiation  of the procedure. Patient was placed prone. CT images of the pelvis were obtained. The air-fluid collection in the presacral region was identified and targeted. The right buttock was prepped with chlorhexidine and sterile field was created. Skin was anesthetized using 1% lidocaine. A small incision was made. Using CT guidance, an 18 gauge trocar needle was directed into the presacral air-fluid collection. Purulent fluid was aspirated. Superstiff Amplatz wire was placed. The tract was  dilated to accommodate a 10 [place] multipurpose drain. 3 mL of brown/yellow purulent fluid was aspirated along with air. Drain was flushed with saline and attached to a gravity bag. Drain was sutured to the skin. Dressing was placed. RADIATION DOSE REDUCTION: This exam was performed according to the departmental dose-optimization program which includes automated exposure control, adjustment of the mA and/or kV according to patient size and/or use of iterative reconstruction technique. FINDINGS: Air-fluid collection in the presacral region. 10 French drain was placed within the collection and 3 mL of purulent fluid was removed. Collection was partially decompressed at the end of the procedure. IMPRESSION: CT-guided placement of a drainage catheter in the presacral abscess. Electronically Signed   By: Richarda Overlie M.D.   On: 08/18/2023 13:58    Anti-infectives: Anti-infectives (From admission, onward)    Start     Dose/Rate Route Frequency Ordered Stop   08/18/23 0400  piperacillin-tazobactam (ZOSYN) IVPB 3.375 g        3.375 g 12.5 mL/hr over 240 Minutes Intravenous Every 8 hours 08/17/23 2007     08/17/23 2200  fidaxomicin (DIFICID) tablet 200 mg  Status:  Discontinued        200 mg Oral 2 times daily 08/17/23 1644 08/18/23 1229   08/17/23 1900  piperacillin-tazobactam (ZOSYN) IVPB 3.375 g        3.375 g 100 mL/hr over 30 Minutes Intravenous  Once 08/17/23 1803 08/18/23 0929        Assessment/Plan  High perirectal (colo) abscess Complex surgical hx of LAR for rectal CA, redo LAR with colo-anal anastomosis with chronic diarrhea - IR drain placed 12/18 - Cx with moderate Klebsiella oxytoca - sensitivities pending and ID directing abx - stable for DC from surgery standpoint when abx plan clear - will need follow up with IR and surgery after drain study - general surgery will be available as needed, please call if acute concerns arise C.Diff antigen positive but toxin negative  FEN: reg  diet VTE: SCDs ID: Zosyn 12/17>>    LOS: 3 days   I reviewed Consultant ID, IR notes, hospitalist notes, last 24 h vitals and pain scores, last 48 h intake and output, and last 24 h labs and trends.   Juliet Rude, [facility] 08/20/2023, 11:32 AM Please see Amion for pager number during day hours 7:00am-4:30pm

## 2023-08-20 NOTE — Progress Notes (Signed)
Pt and all belongings transferred to 2W[address][id number] without incident. Report given to Gerarda Gunther, RN all questions answered.

## 2023-08-20 NOTE — Progress Notes (Addendum)
Chart check  No event overnight Drain cx growing kleb oxytoca   Labs: Reviewed   A/p Perirectal abscess Chronic diarrhea - negative cdiff testing  12/18 drain cx growing kleb oxytoca, pending final report/sensitivity  -continue piptazo -will narrow abx as indicated by final cx of the abscess -anticipate potentially discharge on oral abx early next week -dr Daiva Eves on this weekend for question; I'll be back next week

## 2023-08-21 ENCOUNTER — Inpatient Hospital Stay (HOSPITAL_COMMUNITY): Payer: BC Managed Care – PPO

## 2023-08-21 DIAGNOSIS — K611 Rectal abscess: Secondary | ICD-10-CM | POA: Diagnosis not present

## 2023-08-21 LAB — BASIC METABOLIC PANEL
Anion gap: 8 (ref 5–15)
BUN: 12 mg/dL (ref 8–23)
CO2: 24 mmol/L (ref 22–32)
Calcium: 8.4 mg/dL — ABNORMAL LOW (ref 8.9–10.3)
Chloride: 108 mmol/L (ref 98–111)
Creatinine, Ser: 0.81 mg/dL (ref 0.44–1.00)
GFR, Estimated: >60 mL/min (ref >60)
Glucose, Bld: 94 mg/dL (ref 70–99)
Potassium: 3.6 mmol/L (ref 3.5–5.1)
Sodium: 140 mmol/L (ref 135–145)

## 2023-08-21 LAB — CBC
HCT: 33.1 % — ABNORMAL LOW (ref 36.0–46.0)
Hemoglobin: 10.7 g/dL — ABNORMAL LOW (ref 12.0–15.0)
MCH: 29.2 pg (ref 26.0–34.0)
MCHC: 32.3 g/dL (ref 30.0–36.0)
MCV: 90.4 fL (ref 80.0–100.0)
Platelets: 298 10*3/uL (ref 150–400)
RBC: 3.66 MIL/uL — ABNORMAL LOW (ref 3.87–5.11)
RDW: 12.7 % (ref 11.5–15.5)
WBC: 13 10*3/uL — ABNORMAL HIGH (ref 4.0–10.5)
nRBC: 0 % (ref 0.0–0.2)

## 2023-08-21 MED ORDER — IBUPROFEN 200 MG PO TABS: 600.0000 mg | ORAL_TABLET | Freq: Four times a day (QID) | ORAL | Status: DC | PRN

## 2023-08-21 MED ORDER — SENNOSIDES-DOCUSATE SODIUM 8.6-50 MG PO TABS
1.0000 | ORAL_TABLET | Freq: Every day | ORAL | Status: DC
  Administered 2023-08-21 – 2023-08-22 (×2): 1 via ORAL
  Filled 2023-08-21: qty 1

## 2023-08-21 MED ORDER — SODIUM CHLORIDE 0.9 % IV SOLN
2.0000 g | INTRAVENOUS | Status: DC
  Administered 2023-08-21 – 2023-08-22 (×2): 2 g via INTRAVENOUS
  Filled 2023-08-21 (×2): qty 20

## 2023-08-21 MED ORDER — POLYETHYLENE GLYCOL 3350 17 G PO PACK: 17.0000 g | PACK | Freq: Every day | ORAL | Status: DC | PRN

## 2023-08-21 NOTE — Progress Notes (Signed)
PROGRESS NOTE Charleta Neiheisel  GMW:[id number] DOB: 1956/01/03 DOA: 08/17/2023 PCP: Clayborne Dana, NP  Brief Narrative/Hospital Course: [name] is a 67 y.o. female with a history of bowel resection, colostomy with reversal x2, rectal tumor, hypertension, GERD, recurrent C. difficile.  Patient presented secondary to fever and confusion to an outside ED and was found to have evidence of a perirectal abscess, meeting sepsis criteria. She was transferred to [facility] General surgery consulted and recommend IR evaluation for aspiration. 12/18> CT-guided drainage catheter placed and presacral abscess. ID following.    Subjective: Patient seen examined this morning Complains of somewhat worsening of the pain Overnight afebrile, labs showed slightly uptrending WBC count  Patient had accidentally removed her drain  Assessment and Plan: Principal Problem:   Perirectal abscess Active Problems:   Rectal cancer s/p redo robotic LAR with coloanal anastomosis 07/01/2016  Pericolonic pelvic poa History of colorectal cancer status post partial colectomy Chronic abdominal pain: Did not meet sepsis criteria on admit.Reportedly febrile 101 at [facility]-possibly sepsis on presentation at [facility]. S/p CT-guided drainage catheter placed 12/18, ID, IR and general surgery following> for repeat CT abdomen this morning-possibly reinsertion of the drain. 12/21:CT abdomen shows persistent posterior pelvic abscess containing a large amount of gas. Drain culture with Klebsiella oxytoca, C/S pending. Drain tube was accidentally removed overnight and IR surgery notified.  Continue IV Zosyn as per ID pending further C/S.  Continue pain management  Acute metabolic encephalopathy: Likely secondary to acute illness and fever and ?  Dilaudid.Resolved.Avoid opiates/benzos. Keep on delirium precautions.   Chronic diarrhea: ~30x/daily since her bowel surgery C. difficile testing negative, off antibiotics, remains stable  at baseline continue supportive care Per ID Deficid discontinued  Hypokalemia Hyponatremia: Resolved   Abnormal UA: Already on antibiotics  Essential tremor: cont propranolol  GERD: Continue Pepcid   Class I Obesity: Patient's Body mass index is 32.49 kg/m. : Will benefit with PCP follow-up, weight loss  healthy lifestyle and outpatient sleep evaluation.   DVT prophylaxis: SCDs Start: 08/17/23 1642 Code Status:   Code Status: Full Code Family Communication: plan of care discussed with patient/sister at bedside. Patient status is: Remains hospitalized because of severity of illness Level of care: Med-Surg   Dispo: The patient is from: home w/ husband independent            Anticipated disposition: Anticipate discharge early next week   Objective: Vitals last 24 hrs: Vitals:   08/20/23 1300 08/20/23 2136 08/21/23 0508 08/21/23 0812  BP: 101/61 (!) 104/59 (!) 116/58 116/73  Pulse: 61 64 70 80  Resp:  18 18   Temp:  98.8 F (37.1 C) 99.1 F (37.3 C) 98.3 F (36.8 C)  TempSrc:  Axillary Oral Oral  SpO2:  94% 93% 97%  Weight:      Height:       Weight change:   Physical Examination: General exam: alert awake, oriented at baseline, older than stated age HEENT:Oral mucosa moist, Ear/Nose WNL grossly Respiratory system: Bilaterally clear BS,no use of accessory muscle Cardiovascular system: S1 & S2 +, No JVD. Gastrointestinal system: Abdomen soft, tender lower abdomen  Nervous System: Alert, awake, moving all extremities,and following commands. Extremities: LE edema neg,distal peripheral pulses palpable and warm.  Skin: No rashes,no icterus. MSK: Normal muscle bulk,tone, power   Medications reviewed:  Scheduled Meds:  famotidine  20 mg Oral Daily   feeding supplement  237 mL Oral BID BM   hydrocortisone  1 Application Rectal BID   influenza vaccine  adjuvanted  0.5 mL Intramuscular Tomorrow-1000   potassium chloride  20 mEq Oral Daily   propranolol  10 mg Oral  TID   psyllium  1 packet Oral BID   saccharomyces boulardii  250 mg Oral BID   sodium chloride flush  5 mL Intracatheter Q8H   sodium chloride flush  5 mL Intracatheter Q8H  Continuous Infusions:  piperacillin-tazobactam (ZOSYN)  IV 3.375 g (08/21/23 0404)    Diet Order             Diet regular Room service appropriate? Yes; Fluid consistency: Thin  Diet effective now                  Intake/Output Summary (Last 24 hours) at 08/21/2023 1151 Last data filed at 08/20/2023 1307 Gross per 24 hour  Intake 5 ml  Output --  Net 5 ml  Net IO Since Admission: 497.31 mL [08/21/23 1151]  Wt Readings from Last 3 Encounters:  08/17/23 83.2 kg  06/16/23 83.3 kg  12/15/22 86.2 kg   Unresulted Labs (From admission, onward)     Start     Ordered   08/20/23 1525  Aerobic/Anaerobic Culture w Gram Stain (surgical/deep wound)  Once,   R       Question:  Patient immune status  Answer:  Normal   08/20/23 1525   08/20/23 0500  Basic metabolic panel  Daily,   R     Question:  Specimen collection method  Answer:  Lab=Lab collect   08/19/23 0802   08/20/23 0500  CBC  Daily,   R     Question:  Specimen collection method  Answer:  Lab=Lab collect   08/19/23 0802          Data Reviewed: I have personally reviewed following labs and imaging studies CBC: Recent Labs  Lab 08/18/23 0554 08/20/23 0555 08/21/23 0559  WBC 11.6* 11.1* 13.0*  HGB 11.1* 10.7* 10.7*  HCT 34.2* 32.2* 33.1*  MCV 90.0 87.5 90.4  PLT 254 276 298  Basic Metabolic Panel:  Recent Labs  Lab 08/18/23 0554 08/20/23 0555 08/21/23 0559  NA 140 136 140  K 3.4* 3.4* 3.6  CL 105 103 108  CO2 24 23 24   GLUCOSE 91 107* 94  BUN 8 11 12   CREATININE 0.90 0.68 0.81  CALCIUM 8.8* 8.7* 8.4*   GFR: Estimated Creatinine Clearance: 68.8 mL/min (by C-G formula based on SCr of 0.81 mg/dL). Liver Function Tests:  Recent Labs  Lab 08/18/23 0554  AST 29  ALT 30  ALKPHOS 67  BILITOT 1.1  PROT 6.0*  ALBUMIN 3.0*  Sepsis  Labs: No results for input(s): "PROCALCITON", "LATICACIDVEN" in the last 168 hours. Recent Results (from the past 240 hours)  C Difficile Quick Screen w PCR reflex     Status: None   Collection Time: 08/18/23 12:07 PM   Specimen: STOOL  Result Value Ref Range Status   C Diff antigen NEGATIVE NEGATIVE Final   C Diff toxin NEGATIVE NEGATIVE Final   C Diff interpretation No C. difficile detected.  Final    Comment: Performed at [facility], [address] [place], [place] [address]  Aerobic/Anaerobic Culture w Gram Stain (surgical/deep wound)     Status: None (Preliminary result)   Collection Time: 08/18/23 12:24 PM   Specimen: Abscess  Result Value Ref Range Status   Specimen Description ABSCESS  Final   Special Requests PRESACRAL  Final   Gram Stain   Final  MODERATE WBC PRESENT,BOTH PMN AND MONONUCLEAR FEW GRAM VARIABLE ROD Performed at [facility], [address], [place], [place] [address]    Culture   Final    MODERATE KLEBSIELLA OXYTOCA CULTURE REINCUBATED FOR BETTER GROWTH NO ANAEROBES ISOLATED; CULTURE IN PROGRESS FOR 5 DAYS    Report Status PENDING  Incomplete   Organism ID, Bacteria KLEBSIELLA OXYTOCA  Final      Susceptibility   Klebsiella oxytoca - MIC*    AMPICILLIN >=32 RESISTANT Resistant     CEFEPIME <=0.12 SENSITIVE Sensitive     CEFTAZIDIME <=1 SENSITIVE Sensitive     CEFTRIAXONE <=0.25 SENSITIVE Sensitive     CIPROFLOXACIN <=0.25 SENSITIVE Sensitive     GENTAMICIN <=1 SENSITIVE Sensitive     IMIPENEM 0.5 SENSITIVE Sensitive     TRIMETH/SULFA <=20 SENSITIVE Sensitive     AMPICILLIN/SULBACTAM >=32 RESISTANT Resistant     PIP/TAZO 8 SENSITIVE Sensitive ug/mL    * MODERATE KLEBSIELLA OXYTOCA    Antimicrobials/Microbiology: Anti-infectives (From admission, onward)    Start     Dose/Rate Route Frequency Ordered Stop   08/18/23 0400  piperacillin-tazobactam (ZOSYN) IVPB 3.375 g        3.375 g 12.5 mL/hr over 240 Minutes Intravenous Every 8  hours 08/17/23 2007     08/17/23 2200  fidaxomicin (DIFICID) tablet 200 mg  Status:  Discontinued        200 mg Oral 2 times daily 08/17/23 1644 08/18/23 1229   08/17/23 1900  piperacillin-tazobactam (ZOSYN) IVPB 3.375 g        3.375 g 100 mL/hr over 30 Minutes Intravenous  Once 08/17/23 1803 08/18/23 0929         Component Value Date/Time   SDES ABSCESS 08/18/2023 1224   SPECREQUEST PRESACRAL 08/18/2023 1224   CULT  08/18/2023 1224    MODERATE KLEBSIELLA OXYTOCA CULTURE REINCUBATED FOR BETTER GROWTH NO ANAEROBES ISOLATED; CULTURE IN PROGRESS FOR 5 DAYS    REPTSTATUS PENDING 08/18/2023 1224     Radiology Studies: CT PELVIS WO CONTRAST Result Date: 08/21/2023 CLINICAL DATA:  Recent percutaneous catheter placement for pelvic abscess. The catheter has come out. Evaluate persistent abscess. EXAM: CT PELVIS WITHOUT CONTRAST TECHNIQUE: Multidetector CT imaging of the pelvis was performed following the standard protocol without intravenous contrast. RADIATION DOSE REDUCTION: This exam was performed according to the departmental dose-optimization program which includes automated exposure control, adjustment of the mA and/or kV according to patient size and/or use of iterative reconstruction technique. COMPARISON:  CT scan 08/18/2023. FINDINGS: The percutaneous drainage catheter is no longer present. No catheter fragments are seen. Unfortunately, there is a persistent posterior pelvic abscess containing a large amount of gas. It measures approximately 6.5 x 4.8 cm on the sagittal sequence. IMPRESSION: 1. The percutaneous drainage catheter is no longer present. No catheter fragments are seen. 2. Persistent posterior pelvic abscess containing a large amount of gas. Electronically Signed   By: Rudie Meyer M.D.   On: 08/21/2023 10:55     LOS: 4 days   Total time spent in review of labs and imaging, patient evaluation, formulation of plan, documentation and communication with family: 35   minutes  Lanae Boast, MD [facility]  08/21/2023, 11:51 AM

## 2023-08-21 NOTE — Progress Notes (Addendum)
    \    Date: 08/21/2023  Patient name: [name]  Medical record number: [id number]  Date of birth: 1955-11-26   Cultures reviewed and Klebsiella oxytoca isolated which is not an amp C producing beta-lactamase organism.  Will change her to ceftriaxone 2 g daily    Acey Lav 08/21/2023, 12:35 PM

## 2023-08-21 NOTE — Progress Notes (Signed)
Entered patients room to find that her drain had been accidentally removed. Notified general surgery and IR.

## 2023-08-21 NOTE — Progress Notes (Signed)
  Informed that patient inadvertently removed her drain.  It was placed 08/18/23  CT scan done today showed = Persistent posterior pelvic abscess containing a large amount of gas. It measures approximately 6.5 x 4.8 cm.  Will plan for replacement of drain tomorrow in CT per Dr. Bryn Gulling. Orders placed for NPO at [facility].   Villa Herb PA-C 08/21/2023 1:25 PM

## 2023-08-21 NOTE — Progress Notes (Signed)
Progress Note     Subjective: IR drain was pulled out when she was ambulating to bathroom. Having rectal pain and pain at drain site. Continues to have frequent loose bowel movements  Sister at bedside  Objective: Vital signs in last 24 hours: Temp:  [98.3 F (36.8 C)-99.1 F (37.3 C)] 98.3 F (36.8 C) (12/21 0812) Pulse Rate:  [61-80] 80 (12/21 0812) Resp:  [18] 18 (12/21 0508) BP: (101-116)/(58-73) 116/73 (12/21 0812) SpO2:  [93 %-97 %] 97 % (12/21 0812) Last BM Date : 08/20/23  Intake/Output from previous day: 12/20 0701 - 12/21 0700 In: 5  Out: -  Intake/Output this shift: No intake/output data recorded.  PE: General: pleasant, WD, WN female who is laying in bed in NAD Lungs: Respiratory effort nonlabored Abd: soft, drain site with suture remaining - removed. No discharge or erythema at site Psych: A&Ox3 with an appropriate affect.    Lab Results:  Recent Labs    08/20/23 0555 08/21/23 0559  WBC 11.1* 13.0*  HGB 10.7* 10.7*  HCT 32.2* 33.1*  PLT 276 298   BMET Recent Labs    08/20/23 0555 08/21/23 0559  NA 136 140  K 3.4* 3.6  CL 103 108  CO2 23 24  GLUCOSE 107* 94  BUN 11 12  CREATININE 0.68 0.81  CALCIUM 8.7* 8.4*   PT/INR Recent Labs    08/18/23 1437  LABPROT 14.7  INR 1.1   CMP     Component Value Date/Time   NA 140 08/21/2023 0559   NA 141 11/18/2016 0820   K 3.6 08/21/2023 0559   K 4.2 11/18/2016 0820   CL 108 08/21/2023 0559   CO2 24 08/21/2023 0559   CO2 26 11/18/2016 0820   GLUCOSE 94 08/21/2023 0559   GLUCOSE 96 11/18/2016 0820   BUN 12 08/21/2023 0559   BUN 18.6 11/18/2016 0820   CREATININE 0.81 08/21/2023 0559   CREATININE 0.59 03/07/2019 1005   CREATININE 0.8 11/18/2016 0820   CALCIUM 8.4 (L) 08/21/2023 0559   CALCIUM 9.6 11/18/2016 0820   PROT 6.0 (L) 08/18/2023 0554   PROT 7.0 11/18/2016 0820   ALBUMIN 3.0 (L) 08/18/2023 0554   ALBUMIN 3.2 (L) 11/18/2016 0820   AST 29 08/18/2023 0554   AST 12 11/18/2016  0820   ALT 30 08/18/2023 0554   ALT 10 11/18/2016 0820   ALKPHOS 67 08/18/2023 0554   ALKPHOS 60 11/18/2016 0820   BILITOT 1.1 08/18/2023 0554   BILITOT 0.78 11/18/2016 0820   GFRNONAA >60 08/21/2023 0559   GFRAA >60 10/27/2016 0431   Lipase  No results found for: "LIPASE"     Studies/Results: No results found.   Anti-infectives: Anti-infectives (From admission, onward)    Start     Dose/Rate Route Frequency Ordered Stop   08/18/23 0400  piperacillin-tazobactam (ZOSYN) IVPB 3.375 g        3.375 g 12.5 mL/hr over 240 Minutes Intravenous Every 8 hours 08/17/23 2007     08/17/23 2200  fidaxomicin (DIFICID) tablet 200 mg  Status:  Discontinued        200 mg Oral 2 times daily 08/17/23 1644 08/18/23 1229   08/17/23 1900  piperacillin-tazobactam (ZOSYN) IVPB 3.375 g        3.375 g 100 mL/hr over 30 Minutes Intravenous  Once 08/17/23 1803 08/18/23 0929        Assessment/Plan  High perirectal (colo) abscess Complex surgical hx of LAR for rectal CA, redo LAR with colo-anal anastomosis with chronic  diarrhea - IR drain placed 12/18 - came out. IR contacted by RN - Cx with moderate Klebsiella oxytoca - sensitivities pending and ID directing abx - await possible drain replacement by IR C.Diff antigen positive but toxin negative  FEN: reg diet VTE: SCDs ID: Zosyn 12/17>>    LOS: 4 days   I reviewed Consultant ID, IR notes, hospitalist notes, last 24 h vitals and pain scores, last 48 h intake and output, and last 24 h labs and trends.   Eric Form, PA-C [facility] 08/21/2023, 8:30 AM Please see Amion for pager number during day hours 7:00am-4:30pm

## 2023-08-22 ENCOUNTER — Encounter (HOSPITAL_COMMUNITY): Payer: Self-pay | Admitting: Family Medicine

## 2023-08-22 ENCOUNTER — Inpatient Hospital Stay (HOSPITAL_COMMUNITY): Payer: BC Managed Care – PPO

## 2023-08-22 DIAGNOSIS — K611 Rectal abscess: Secondary | ICD-10-CM | POA: Diagnosis not present

## 2023-08-22 LAB — BASIC METABOLIC PANEL
Anion gap: 9 (ref 5–15)
BUN: 10 mg/dL (ref 8–23)
CO2: 25 mmol/L (ref 22–32)
Calcium: 8.9 mg/dL (ref 8.9–10.3)
Chloride: 109 mmol/L (ref 98–111)
Creatinine, Ser: 0.59 mg/dL (ref 0.44–1.00)
GFR, Estimated: >60 mL/min (ref >60)
Glucose, Bld: 97 mg/dL (ref 70–99)
Potassium: 3.6 mmol/L (ref 3.5–5.1)
Sodium: 143 mmol/L (ref 135–145)

## 2023-08-22 LAB — CBC
HCT: 33.3 % — ABNORMAL LOW (ref 36.0–46.0)
Hemoglobin: 10.9 g/dL — ABNORMAL LOW (ref 12.0–15.0)
MCH: 29.5 pg (ref 26.0–34.0)
MCHC: 32.7 g/dL (ref 30.0–36.0)
MCV: 90 fL (ref 80.0–100.0)
Platelets: 338 10*3/uL (ref 150–400)
RBC: 3.7 MIL/uL — ABNORMAL LOW (ref 3.87–5.11)
RDW: 13 % (ref 11.5–15.5)
WBC: 12.8 10*3/uL — ABNORMAL HIGH (ref 4.0–10.5)
nRBC: 0 % (ref 0.0–0.2)

## 2023-08-22 MED ORDER — MIDAZOLAM HCL 2 MG/2ML IJ SOLN
INTRAMUSCULAR | Status: AC
  Filled 2023-08-22: qty 2

## 2023-08-22 MED ORDER — MIDAZOLAM HCL 2 MG/2ML IJ SOLN
INTRAMUSCULAR | Status: AC | PRN
  Administered 2023-08-22: 1 mg via INTRAVENOUS
  Administered 2023-08-22 (×2): .5 mg via INTRAVENOUS

## 2023-08-22 MED ORDER — LIDOCAINE HCL (PF) 1 % IJ SOLN
10.0000 mL | Freq: Once | INTRAMUSCULAR | Status: AC
  Administered 2023-08-22: 10 mL via INTRADERMAL

## 2023-08-22 MED ORDER — FENTANYL CITRATE (PF) 100 MCG/2ML IJ SOLN
INTRAMUSCULAR | Status: AC
  Filled 2023-08-22: qty 2

## 2023-08-22 MED ORDER — FENTANYL CITRATE (PF) 100 MCG/2ML IJ SOLN
INTRAMUSCULAR | Status: AC | PRN
  Administered 2023-08-22: 50 ug via INTRAVENOUS
  Administered 2023-08-22 (×2): 25 ug via INTRAVENOUS

## 2023-08-22 NOTE — Progress Notes (Signed)
Referring Provider(s): Gentry,Marcus  Supervising Physician: Mir, Secondary school teacher  Patient Status:  [facility] - In-pt  Chief Complaint:  Transgluteal drain - fell out yesterday  Brief History:  [name] is a 67 y.o. female with a complex history involving LAR for mass, redo LAR with colo-anal anastomosis with chronic diarrhea who was being treated for C.diff colitis.  She was found to have an abscess between the large intestine and the sacrum.   She underwent transgluteal drain placement on 08/18/23.  Informed yesterday that patient inadvertently removed her drain.  CT scan done today showed = Persistent posterior pelvic abscess containing a large amount of gas. It measures approximately 6.5 x 4.8 cm.  She will need her drain replaced today.  Subjective:  No complaints. She is NPO.  Allergies: Nitrofurantoin monohyd macro, Iodinated contrast media, Morphine, and Metronidazole  Medications: Prior to Admission medications   Medication Sig Start Date End Date Taking? Authorizing Provider  Ascorbic Acid 100 MG CHEW Chew 1 tablet by mouth daily.   Yes [provider]  b complex vitamins capsule Take 1 capsule by mouth daily.   Yes [provider]  Calcium Carb-Cholecalciferol 500-15 MG-MCG TABS Take 1 tablet by mouth daily.   Yes [provider]  cholecalciferol (VITAMIN D3) 10 MCG (400 UNIT) TABS tablet Take 400 Units by mouth daily.   Yes [provider]  Collagen Hydrolysate POWD 1 Dose daily.   Yes [provider]  famotidine-calcium carbonate-magnesium hydroxide (PEPCID COMPLETE) 10-800-165 MG chewable tablet Chew 1 tablet by mouth daily.   Yes [provider]  HYDROcodone-acetaminophen (NORCO/VICODIN) 5-325 MG tablet Take 1 tablet by mouth daily. 06/24/23  Yes [provider]  hydrocortisone (ANUSOL-HC) 2.5 % rectal cream Place 1 Application rectally 2 (two) times daily. 08/16/23 08/15/24 Yes [provider]  Ibuprofen-Acetaminophen 125-250 MG TABS Take 2 tablets by mouth daily.   Yes [provider]  lidocaine (XYLOCAINE) 2 % jelly Apply 1 Application topically daily. 10/06/22  Yes [provider]  propranolol (INDERAL) 10 MG tablet Take 10 mg by mouth 3 (three) times daily. 04/20/23  Yes [provider]  vancomycin (VANCOCIN) 125 MG capsule Take 125 mg by mouth 4 (four) times daily. 08/05/23  Yes [provider]  ondansetron (ZOFRAN) 4 MG tablet Take 4 mg by mouth every 8 (eight) hours as needed. Patient not taking: Reported on 08/17/2023 10/14/22   [provider]     Vital Signs: BP 126/73 (BP Location: Right Arm)   Pulse 65   Temp 97.7 F (36.5 C) (Oral)   Resp 18   Ht 5\' 3"  (1.6 m)   Wt 183 lb 6.8 oz (83.2 kg)   SpO2 98%   BMI 32.49 kg/m   Physical Exam Vitals reviewed.  Constitutional:      Appearance: Normal appearance.  Cardiovascular:     Rate and Rhythm: Normal rate and regular rhythm.  Pulmonary:     Effort: Pulmonary effort is normal. No respiratory distress.     Breath sounds: Normal breath sounds.  Abdominal:     General: There is no distension.     Palpations: Abdomen is soft.  Skin:    General: Skin is warm and dry.  Neurological:     General: No focal deficit present.     Mental Status: She is alert and oriented to person, place, and time.  Psychiatric:        Mood and Affect: Mood normal.  Behavior: Behavior normal.        Thought Content: Thought content normal.        Judgment: Judgment normal.      Labs:  CBC: Recent Labs    08/18/23 0554 08/20/23 0555 08/21/23 0559 08/22/23 0558  WBC 11.6* 11.1* 13.0* 12.8*  HGB 11.1* 10.7* 10.7* 10.9*  HCT 34.2* 32.2* 33.1* 33.3*  PLT 254 276 298 338    COAGS: Recent Labs    08/18/23 1437  INR 1.1    BMP: Recent Labs    08/18/23 0554 08/20/23 0555 08/21/23 0559 08/22/23 0558  NA 140 136 140 143  K 3.4* 3.4* 3.6 3.6  CL 105  103 108 109  CO2 24 23 24 25   GLUCOSE 91 107* 94 97  BUN 8 11 12 10   CALCIUM 8.8* 8.7* 8.4* 8.9  CREATININE 0.90 0.68 0.81 0.59  GFRNONAA >60 >60 >60 >60    LIVER FUNCTION TESTS: Recent Labs    11/16/22 1145 06/16/23 0931 08/18/23 0554  BILITOT 0.5 0.7 1.1  AST 14 22 29   ALT 11 23 30   ALKPHOS 60 60 67  PROT 6.6 6.4 6.0*  ALBUMIN 3.9 3.9 3.0*    Assessment and Plan:  Persistent posterior pelvic abscess containing a large amount of gas. It measures approximately 6.5 x 4.8 cm.  Will proceed with image guided replacement of her drain today by Dr.Mir.  Risks and benefits discussed with the patient including bleeding, infection, damage to adjacent structures, bowel perforation/fistula connection, and sepsis.  All of the patient's questions were answered, patient is agreeable to proceed. Consent signed and in chart.   Electronically Signed: Gwynneth Macleod, PA-C 08/22/2023, 8:06 AM    I spent a total of 15 Minutes at the the patient's bedside AND on the patient's hospital floor or unit, greater than 50% of which was counseling/coordinating care for drain replacement.

## 2023-08-22 NOTE — Progress Notes (Signed)
Progress Note     Subjective: Started fiber yesterday. Had some more pain with Bms with this. Took a stool softener which helped  Sister at bedside  Objective: Vital signs in last 24 hours: Temp:  [97.7 F (36.5 C)-98.6 F (37 C)] 97.7 F (36.5 C) (12/22 0542) Pulse Rate:  [56-74] 65 (12/22 0542) Resp:  [15-18] 18 (12/22 0542) BP: (92-126)/(52-73) 126/73 (12/22 0542) SpO2:  [97 %-98 %] 98 % (12/22 0542) Last BM Date : 08/20/23  Intake/Output from previous day: No intake/output data recorded. Intake/Output this shift: No intake/output data recorded.  PE: General: pleasant, WD, WN female who is laying in bed in NAD Lungs: Respiratory effort nonlabored Abd: soft, NT, ND Psych: A&Ox3 with an appropriate affect.    Lab Results:  Recent Labs    08/21/23 0559 08/22/23 0558  WBC 13.0* 12.8*  HGB 10.7* 10.9*  HCT 33.1* 33.3*  PLT 298 338   BMET Recent Labs    08/21/23 0559 08/22/23 0558  NA 140 143  K 3.6 3.6  CL 108 109  CO2 24 25  GLUCOSE 94 97  BUN 12 10  CREATININE 0.81 0.59  CALCIUM 8.4* 8.9   PT/INR No results for input(s): "LABPROT", "INR" in the last 72 hours.  CMP     Component Value Date/Time   NA 143 08/22/2023 0558   NA 141 11/18/2016 0820   K 3.6 08/22/2023 0558   K 4.2 11/18/2016 0820   CL 109 08/22/2023 0558   CO2 25 08/22/2023 0558   CO2 26 11/18/2016 0820   GLUCOSE 97 08/22/2023 0558   GLUCOSE 96 11/18/2016 0820   BUN 10 08/22/2023 0558   BUN 18.6 11/18/2016 0820   CREATININE 0.59 08/22/2023 0558   CREATININE 0.59 03/07/2019 1005   CREATININE 0.8 11/18/2016 0820   CALCIUM 8.9 08/22/2023 0558   CALCIUM 9.6 11/18/2016 0820   PROT 6.0 (L) 08/18/2023 0554   PROT 7.0 11/18/2016 0820   ALBUMIN 3.0 (L) 08/18/2023 0554   ALBUMIN 3.2 (L) 11/18/2016 0820   AST 29 08/18/2023 0554   AST 12 11/18/2016 0820   ALT 30 08/18/2023 0554   ALT 10 11/18/2016 0820   ALKPHOS 67 08/18/2023 0554   ALKPHOS 60 11/18/2016 0820   BILITOT 1.1  08/18/2023 0554   BILITOT 0.78 11/18/2016 0820   GFRNONAA >60 08/22/2023 0558   GFRAA >60 10/27/2016 0431   Lipase  No results found for: "LIPASE"     Studies/Results: CT PELVIS WO CONTRAST Result Date: 08/21/2023 CLINICAL DATA:  Recent percutaneous catheter placement for pelvic abscess. The catheter has come out. Evaluate persistent abscess. EXAM: CT PELVIS WITHOUT CONTRAST TECHNIQUE: Multidetector CT imaging of the pelvis was performed following the standard protocol without intravenous contrast. RADIATION DOSE REDUCTION: This exam was performed according to the departmental dose-optimization program which includes automated exposure control, adjustment of the mA and/or kV according to patient size and/or use of iterative reconstruction technique. COMPARISON:  CT scan 08/18/2023. FINDINGS: The percutaneous drainage catheter is no longer present. No catheter fragments are seen. Unfortunately, there is a persistent posterior pelvic abscess containing a large amount of gas. It measures approximately 6.5 x 4.8 cm on the sagittal sequence. IMPRESSION: 1. The percutaneous drainage catheter is no longer present. No catheter fragments are seen. 2. Persistent posterior pelvic abscess containing a large amount of gas. Electronically Signed   By: Rudie Meyer M.D.   On: 08/21/2023 10:55     Anti-infectives: Anti-infectives (From admission, onward)  Start     Dose/Rate Route Frequency Ordered Stop   08/21/23 2000  cefTRIAXone (ROCEPHIN) 2 g in sodium chloride 0.9 % 100 mL IVPB        2 g 200 mL/hr over 30 Minutes Intravenous Every 24 hours 08/21/23 1236     08/18/23 0400  piperacillin-tazobactam (ZOSYN) IVPB 3.375 g  Status:  Discontinued        3.375 g 12.5 mL/hr over 240 Minutes Intravenous Every 8 hours 08/17/23 2007 08/21/23 1236   08/17/23 2200  fidaxomicin (DIFICID) tablet 200 mg  Status:  Discontinued        200 mg Oral 2 times daily 08/17/23 1644 08/18/23 1229   08/17/23 1900   piperacillin-tazobactam (ZOSYN) IVPB 3.375 g        3.375 g 100 mL/hr over 30 Minutes Intravenous  Once 08/17/23 1803 08/18/23 0929        Assessment/Plan  High perirectal (colo) abscess Complex surgical hx of LAR for rectal CA, redo LAR with colo-anal anastomosis with chronic diarrhea - IR drain placed 12/18 - came out. IR planning replacement today - Cx with moderate Klebsiella oxytoca - ID directing abx - await possible drain replacement by IR C.Diff antigen positive but toxin negative  FEN: NPO for IR VTE: SCDs ID: [email or web address]. ceftriaxone    LOS: 5 days   I reviewed Consultant ID, IR notes, hospitalist notes, last 24 h vitals and pain scores, last 48 h intake and output, and last 24 h labs and trends.   Eric Form, [facility] 08/22/2023, 8:45 AM Please see Amion for pager number during day hours 7:00am-4:30pm

## 2023-08-22 NOTE — Procedures (Signed)
Interventional Radiology Procedure Note  Procedure: CT guided pre sacral abscess drain placement  Indication: Pre Sacral abscess   Findings: Please refer to procedural dictation for full description.  Complications: None  EBL: < 10 mL  Acquanetta Belling, MD [phone]

## 2023-08-22 NOTE — Progress Notes (Signed)
PROGRESS NOTE [name]  ION:[id number] DOB: Apr 24, 1956 DOA: 08/17/2023 PCP: Clayborne Dana, NP  Brief Narrative/Hospital Course: [name] is a 67 y.o. female with a history of bowel resection, colostomy with reversal x2, rectal tumor, hypertension, GERD, recurrent C. difficile.  Patient presented secondary to fever and confusion to an outside ED and was found to have evidence of a perirectal abscess, meeting sepsis criteria. She was transferred to [facility]. General surgery consulted and recommend IR evaluation for aspiration. 12/18> CT-guided drainage catheter placed and presacral abscess. ID following. Pelvic drain accidentally got removed 12/21 and CT obtained showing 6.5X 4.8 cm pelvic abscess with large amount of gas.    Subjective: Seen and examined Complains of pain on moving Having bowel movements small-volume Overnight afebrile, vitals otherwise stable Labs shows WBC slightly better at 2.8 Continues to need oxy,Tylenol for pain control  Assessment and Plan: Principal Problem:   Perirectal abscess Active Problems:   Rectal cancer s/p redo robotic LAR with coloanal anastomosis 07/01/2016  Pericolonic pelvic poa History of colorectal cancer status post partial colectomy Chronic abdominal pain: Did not meet sepsis criteria on admit.Reportedly febrile 101 at [facility]-possibly sepsis on presentation at [facility].  ID, IR and general surgery following S/p CT-guided drainage catheter placed 12/18 and was draining well  fluid sample with Klebsiella oxytoca>changed to ceftriaxone 12/21 drain accidentally got removed 12/21 and CT still showing 6.5X 4.8 cm pelvic abscess with large amount of gas Plan to replacement of drain 12/22 by IR and continue pain control PT OT  Acute metabolic encephalopathy: Likely secondary to acute illness and fever and ?  Dilaudid. Resolved  Chronic diarrhea: ~30x/daily since her bowel surgery C. difficile testing negative, off antibiotics,  remains stable at baseline continue supportive care Per ID Deficid discontinued  Hypokalemia Hyponatremia: Resolved  Abnormal UA: Already on antibiotics  Essential tremor: cont propranolol  GERD: Continue Pepcid   Class I Obesity: Patient's Body mass index is 32.49 kg/m. : Will benefit with PCP follow-up, weight loss  healthy lifestyle and outpatient sleep evaluation.   DVT prophylaxis: SCDs Start: 08/17/23 1642 chemical prophylaxis once okay with IR Code Status:   Code Status: Full Code Family Communication: plan of care discussed with patient/sister at bedside. Patient status is: Remains hospitalized because of severity of illness Level of care: Med-Surg   Dispo: The patient is from: home w/ husband independent            Anticipated disposition: Anticipate discharge early next week   Objective: Vitals last 24 hrs: Vitals:   08/21/23 1513 08/21/23 1606 08/22/23 0542 08/22/23 0858  BP: 108/64 110/62 126/73 139/76  Pulse: (!) 56 74 65 76  Resp:  15 18 20   Temp:  98.6 F (37 C) 97.7 F (36.5 C) (!) 97.5 F (36.4 C)  TempSrc:  Oral Oral Oral  SpO2:  98% 98% 97%  Weight:      Height:       Weight change:   Physical Examination: General exam: alert awake, oriented, pleasant HEENT:Oral mucosa moist, Ear/Nose WNL grossly Respiratory system: Bilaterally clear BS,no use of accessory muscle Cardiovascular system: S1 & S2 +, No JVD. Gastrointestinal system: Abdomen soft,NT,ND, BS+ Nervous System: Alert, awake, moving all extremities,and following commands. Extremities: LE edema neg,distal peripheral pulses palpable and warm.  Skin: No rashes,no icterus. MSK: Normal muscle bulk,tone, power   Medications reviewed:  Scheduled Meds:  famotidine  20 mg Oral Daily   feeding supplement  237 mL Oral BID BM   hydrocortisone  1 Application Rectal BID   influenza vaccine adjuvanted  0.5 mL Intramuscular Tomorrow-1000   potassium chloride  20 mEq Oral Daily   propranolol   10 mg Oral TID   psyllium  1 packet Oral BID   saccharomyces boulardii  250 mg Oral BID   senna-docusate  1 tablet Oral QHS   sodium chloride flush  5 mL Intracatheter Q8H   sodium chloride flush  5 mL Intracatheter Q8H  Continuous Infusions:  cefTRIAXone (ROCEPHIN)  IV Stopped (08/21/23 2015)    Diet Order             Diet NPO time specified Except for: Sips with Meds  Diet effective midnight                 No intake or output data in the 24 hours ending 08/22/23 0942 Net IO Since Admission: 497.31 mL [08/22/23 0942]  Wt Readings from Last 3 Encounters:  08/17/23 83.2 kg  06/16/23 83.3 kg  12/15/22 86.2 kg   Unresulted Labs (From admission, onward)     Start     Ordered   08/20/23 1525  Aerobic/Anaerobic Culture w Gram Stain (surgical/deep wound)  Once,   R       Question:  Patient immune status  Answer:  Normal   08/20/23 1525   08/20/23 0500  Basic metabolic panel  Daily,   R     Question:  Specimen collection method  Answer:  Lab=Lab collect   08/19/23 0802   08/20/23 0500  CBC  Daily,   R     Question:  Specimen collection method  Answer:  Lab=Lab collect   08/19/23 0802          Data Reviewed: I have personally reviewed following labs and imaging studies CBC: Recent Labs  Lab 08/18/23 0554 08/20/23 0555 08/21/23 0559 08/22/23 0558  WBC 11.6* 11.1* 13.0* 12.8*  HGB 11.1* 10.7* 10.7* 10.9*  HCT 34.2* 32.2* 33.1* 33.3*  MCV 90.0 87.5 90.4 90.0  PLT 254 276 298 338  Basic Metabolic Panel:  Recent Labs  Lab 08/18/23 0554 08/20/23 0555 08/21/23 0559 08/22/23 0558  NA 140 136 140 143  K 3.4* 3.4* 3.6 3.6  CL 105 103 108 109  CO2 24 23 24 25   GLUCOSE 91 107* 94 97  BUN 8 11 12 10   CREATININE 0.90 0.68 0.81 0.59  CALCIUM 8.8* 8.7* 8.4* 8.9   GFR: Estimated Creatinine Clearance: 69.7 mL/min (by C-G formula based on SCr of 0.59 mg/dL). Liver Function Tests:  Recent Labs  Lab 08/18/23 0554  AST 29  ALT 30  ALKPHOS 67  BILITOT 1.1  PROT 6.0*   ALBUMIN 3.0*  Sepsis Labs: No results for input(s): "PROCALCITON", "LATICACIDVEN" in the last 168 hours. Recent Results (from the past 240 hours)  C Difficile Quick Screen w PCR reflex     Status: None   Collection Time: 08/18/23 12:07 PM   Specimen: STOOL  Result Value Ref Range Status   C Diff antigen NEGATIVE NEGATIVE Final   C Diff toxin NEGATIVE NEGATIVE Final   C Diff interpretation No C. difficile detected.  Final    Comment: Performed at [facility], [address], [place], [place] [address]  Aerobic/Anaerobic Culture w Gram Stain (surgical/deep wound)     Status: None (Preliminary result)   Collection Time: 08/18/23 12:24 PM   Specimen: Abscess  Result Value Ref Range Status   Specimen Description ABSCESS  Final   Special Requests  PRESACRAL  Final   Gram Stain   Final    MODERATE WBC PRESENT,BOTH PMN AND MONONUCLEAR FEW GRAM VARIABLE ROD Performed at [facility], [address], [place], [place] [address]    Culture   Final    MODERATE KLEBSIELLA OXYTOCA CULTURE REINCUBATED FOR BETTER GROWTH NO ANAEROBES ISOLATED; CULTURE IN PROGRESS FOR 5 DAYS    Report Status PENDING  Incomplete   Organism ID, Bacteria KLEBSIELLA OXYTOCA  Final      Susceptibility   Klebsiella oxytoca - MIC*    AMPICILLIN >=32 RESISTANT Resistant     CEFEPIME <=0.12 SENSITIVE Sensitive     CEFTAZIDIME <=1 SENSITIVE Sensitive     CEFTRIAXONE <=0.25 SENSITIVE Sensitive     CIPROFLOXACIN <=0.25 SENSITIVE Sensitive     GENTAMICIN <=1 SENSITIVE Sensitive     IMIPENEM 0.5 SENSITIVE Sensitive     TRIMETH/SULFA <=20 SENSITIVE Sensitive     AMPICILLIN/SULBACTAM >=32 RESISTANT Resistant     PIP/TAZO 8 SENSITIVE Sensitive ug/mL    * MODERATE KLEBSIELLA OXYTOCA    Antimicrobials/Microbiology: Anti-infectives (From admission, onward)    Start     Dose/Rate Route Frequency Ordered Stop   08/21/23 2000  cefTRIAXone (ROCEPHIN) 2 g in sodium chloride 0.9 % 100 mL IVPB        2 g 200 mL/hr  over 30 Minutes Intravenous Every 24 hours 08/21/23 1236     08/18/23 0400  piperacillin-tazobactam (ZOSYN) IVPB 3.375 g  Status:  Discontinued        3.375 g 12.5 mL/hr over 240 Minutes Intravenous Every 8 hours 08/17/23 2007 08/21/23 1236   08/17/23 2200  fidaxomicin (DIFICID) tablet 200 mg  Status:  Discontinued        200 mg Oral 2 times daily 08/17/23 1644 08/18/23 1229   08/17/23 1900  piperacillin-tazobactam (ZOSYN) IVPB 3.375 g        3.375 g 100 mL/hr over 30 Minutes Intravenous  Once 08/17/23 1803 08/18/23 0929         Component Value Date/Time   SDES ABSCESS 08/18/2023 1224   SPECREQUEST PRESACRAL 08/18/2023 1224   CULT  08/18/2023 1224    MODERATE KLEBSIELLA OXYTOCA CULTURE REINCUBATED FOR BETTER GROWTH NO ANAEROBES ISOLATED; CULTURE IN PROGRESS FOR 5 DAYS    REPTSTATUS PENDING 08/18/2023 1224     Radiology Studies: CT PELVIS WO CONTRAST Result Date: 08/21/2023 CLINICAL DATA:  Recent percutaneous catheter placement for pelvic abscess. The catheter has come out. Evaluate persistent abscess. EXAM: CT PELVIS WITHOUT CONTRAST TECHNIQUE: Multidetector CT imaging of the pelvis was performed following the standard protocol without intravenous contrast. RADIATION DOSE REDUCTION: This exam was performed according to the departmental dose-optimization program which includes automated exposure control, adjustment of the mA and/or kV according to patient size and/or use of iterative reconstruction technique. COMPARISON:  CT scan 08/18/2023. FINDINGS: The percutaneous drainage catheter is no longer present. No catheter fragments are seen. Unfortunately, there is a persistent posterior pelvic abscess containing a large amount of gas. It measures approximately 6.5 x 4.8 cm on the sagittal sequence. IMPRESSION: 1. The percutaneous drainage catheter is no longer present. No catheter fragments are seen. 2. Persistent posterior pelvic abscess containing a large amount of gas. Electronically  Signed   By: Rudie Meyer M.D.   On: 08/21/2023 10:55     LOS: 5 days   Total time spent in review of labs and imaging, patient evaluation, formulation of plan, documentation and communication with family: 35  minutes  Lanae Boast, MD  [facility]  08/22/2023, 9:42 AM

## 2023-08-23 ENCOUNTER — Other Ambulatory Visit (HOSPITAL_COMMUNITY): Payer: Self-pay

## 2023-08-23 DIAGNOSIS — K611 Rectal abscess: Secondary | ICD-10-CM | POA: Diagnosis not present

## 2023-08-23 DIAGNOSIS — Z85048 Personal history of other malignant neoplasm of rectum, rectosigmoid junction, and anus: Secondary | ICD-10-CM | POA: Diagnosis not present

## 2023-08-23 DIAGNOSIS — K529 Noninfective gastroenteritis and colitis, unspecified: Secondary | ICD-10-CM | POA: Diagnosis not present

## 2023-08-23 LAB — CBC
HCT: 31.9 % — ABNORMAL LOW (ref 36.0–46.0)
Hemoglobin: 10.3 g/dL — ABNORMAL LOW (ref 12.0–15.0)
MCH: 28.8 pg (ref 26.0–34.0)
MCHC: 32.3 g/dL (ref 30.0–36.0)
MCV: 89.1 fL (ref 80.0–100.0)
Platelets: 342 10*3/uL (ref 150–400)
RBC: 3.58 MIL/uL — ABNORMAL LOW (ref 3.87–5.11)
RDW: 13.1 % (ref 11.5–15.5)
WBC: 9.7 10*3/uL (ref 4.0–10.5)
nRBC: 0 % (ref 0.0–0.2)

## 2023-08-23 LAB — BASIC METABOLIC PANEL
Anion gap: 9 (ref 5–15)
BUN: 12 mg/dL (ref 8–23)
CO2: 25 mmol/L (ref 22–32)
Calcium: 9 mg/dL (ref 8.9–10.3)
Chloride: 108 mmol/L (ref 98–111)
Creatinine, Ser: 0.67 mg/dL (ref 0.44–1.00)
GFR, Estimated: >60 mL/min (ref >60)
Glucose, Bld: 96 mg/dL (ref 70–99)
Potassium: 3.7 mmol/L (ref 3.5–5.1)
Sodium: 142 mmol/L (ref 135–145)

## 2023-08-23 MED ORDER — ENSURE ENLIVE PO LIQD: 237.0000 mL | Freq: Two times a day (BID) | ORAL | 0 refills | Status: AC

## 2023-08-23 MED ORDER — SULFAMETHOXAZOLE-TRIMETHOPRIM 800-160 MG PO TABS
1.0000 | ORAL_TABLET | Freq: Every day | ORAL | 0 refills | Status: DC
  Filled 2023-08-23: qty 30, 30d supply, fill #0

## 2023-08-23 MED ORDER — PSYLLIUM 95 % PO PACK
1.0000 | PACK | Freq: Every day | ORAL | Status: DC
  Administered 2023-08-23: 1 via ORAL
  Filled 2023-08-23: qty 1

## 2023-08-23 MED ORDER — ONDANSETRON HCL 4 MG PO TABS
4.0000 mg | ORAL_TABLET | Freq: Three times a day (TID) | ORAL | 0 refills | Status: DC | PRN
  Filled 2023-08-23: qty 20, 7d supply, fill #0

## 2023-08-23 MED ORDER — SULFAMETHOXAZOLE-TRIMETHOPRIM 800-160 MG PO TABS: 1.0000 | ORAL_TABLET | Freq: Every day | ORAL | Status: DC

## 2023-08-23 MED ORDER — METHOCARBAMOL 500 MG PO TABS
500.0000 mg | ORAL_TABLET | Freq: Three times a day (TID) | ORAL | 0 refills | Status: DC | PRN
  Filled 2023-08-23: qty 30, 10d supply, fill #0

## 2023-08-23 MED ORDER — SULFAMETHOXAZOLE-TRIMETHOPRIM 800-160 MG PO TABS: 2.0000 | ORAL_TABLET | Freq: Every day | ORAL | Status: DC

## 2023-08-23 MED ORDER — SULFAMETHOXAZOLE-TRIMETHOPRIM 800-160 MG PO TABS
ORAL_TABLET | ORAL | 0 refills | Status: AC
  Filled 2023-08-23: qty 90, 30d supply, fill #0

## 2023-08-23 MED ORDER — SODIUM CHLORIDE FLUSH 0.9 % IV SOLN
10.0000 mL | Freq: Every day | INTRAVENOUS | 3 refills | Status: DC
  Filled 2023-08-23: qty 300, 30d supply, fill #0

## 2023-08-23 MED ORDER — METRONIDAZOLE 500 MG PO TABS
500.0000 mg | ORAL_TABLET | Freq: Two times a day (BID) | ORAL | 0 refills | Status: AC
  Filled 2023-08-23: qty 60, 30d supply, fill #0

## 2023-08-23 MED ORDER — ACIDOPHILUS PO CAPS
ORAL_CAPSULE | Freq: Two times a day (BID) | ORAL | 0 refills | Status: AC
  Filled 2023-08-23: qty 100, 30d supply, fill #0

## 2023-08-23 MED ORDER — METHOCARBAMOL 500 MG PO TABS
500.0000 mg | ORAL_TABLET | Freq: Three times a day (TID) | ORAL | Status: DC | PRN
  Administered 2023-08-23: 500 mg via ORAL
  Filled 2023-08-23: qty 1

## 2023-08-23 MED ORDER — OXYCODONE HCL 5 MG PO TABS: 5.0000 mg | ORAL_TABLET | Freq: Four times a day (QID) | ORAL | 0 refills | Status: AC | PRN

## 2023-08-23 MED ORDER — OXYBUTYNIN CHLORIDE 5 MG PO TABS
5.0000 mg | ORAL_TABLET | Freq: Three times a day (TID) | ORAL | 0 refills | Status: DC | PRN
  Filled 2023-08-23: qty 15, 5d supply, fill #0

## 2023-08-23 MED ORDER — METRONIDAZOLE 500 MG PO TABS: 500.0000 mg | ORAL_TABLET | Freq: Two times a day (BID) | ORAL | Status: DC

## 2023-08-23 NOTE — Discharge Summary (Addendum)
Physician Discharge Summary  [name] FAO:[id number] DOB: 24-Nov-1955 DOA: 08/17/2023  PCP: Clayborne Dana, NP  Admit date: 08/17/2023 Discharge date: 08/23/2023 Recommendations for Outpatient Follow-up:  Follow up with PCP in 1 weeks-call for appointment Please obtain BMP/CBC in one week Please follow-up with drain clinic Follow-up with ID clinic in 2 weeks as scheduled  Discharge Dispo: Home Discharge Condition: Stable Code Status:   Code Status: Full Code Diet recommendation:  Diet Order             Diet regular Room service appropriate? Yes; Fluid consistency: Thin  Diet effective now                    Brief/Interim Summary: [name] is a 67 y.o. female with a history of bowel resection, colostomy with reversal x2, rectal tumor, hypertension, GERD, recurrent C. difficile.  Patient presented secondary to fever and confusion to an outside ED and was found to have evidence of a perirectal abscess, meeting sepsis criteria. She was transferred to [facility]. General surgery consulted and recommend IR evaluation for aspiration. 12/18> CT-guided drainage catheter placed and presacral abscess. ID following.Pelvic drain accidentally got removed 12/21 and CT obtained showing 6.5X 4.8 cm pelvic abscess with large amount of gas>and  reinserted drain on 12/22. Fuid sample with Klebsiella oxytoca. Overall clinically improved.  Surgery has cleared the patient, discussed with ID and okay for discharge on oral antibiotic.  She will follow-up with drain clinic   Discharge Diagnoses:  Principal Problem:   Perirectal abscess Active Problems:   Rectal cancer s/p redo robotic LAR with coloanal anastomosis 07/01/2016  Pericolonic pelvic poa History of colorectal cancer status post partial colectomy Chronic abdominal pain: Did not meet sepsis criteria on admit.Reportedly febrile 101 at [facility]-possibly sepsis on presentation at [facility]. ID, IR and general surgery following>S/p  CT-guided drainage catheter placed 12/18-fell off 12/21- and reinserted 12/22 after CT abd howing 6.5X 4.8 cm pelvic abscess with large amount of gas>fluid sample with Klebsiella oxytoca>changed to ceftriaxone 12/21-discussed with ID plan to discharge today on oral antibiotics: Bactrim and Flagyl x 4 weeks-but ID will decide course in 2 weeks and follow-up. Cont oxy for pain control and hold home norco for now she is afebrile leukocytosis resolved  She will follow-up with drain clinic.    Acute metabolic encephalopathy: Likely secondary to acute illness and fever and ?  Dilaudid.  Currently improved  Chronic diarrhea: ~30x/daily since her bowel surgery C. difficile testing antigen positive but toxin negative, off deficid as per ID.Remains stable at baseline continue supportive care Per ID Deficid discontinued.  Continue Florastor.  Hypokalemia Hyponatremia: Resolved monitor electrolytes  Abnormal UA: Already on antibiotics  Essential tremor: Stable, cont propranolol  GERD: On Pepcid   Class I Obesity: Patient's Body mass index is 32.49 kg/m. : Will benefit with PCP follow-up, weight loss  healthy lifestyle and outpatient sleep evaluation.   Consults: IR, ID, general surgery Subjective: Alert awake oriented, eager to go home today  Discharge Exam: Vitals:   08/23/23 0448 08/23/23 0822  BP: 114/63 133/87  Pulse: 67 82  Resp: 18 18  Temp: 98.3 F (36.8 C) (!) 97.4 F (36.3 C)  SpO2: 98% 97%   General: Pt is alert, awake, not in acute distress Cardiovascular: RRR, S1/S2 +, no rubs, no gallops Respiratory: CTA bilaterally, no wheezing, no rhonchi Abdominal: Soft, NT, ND, bowel sounds + Extremities: no edema, no cyanosis  Discharge Instructions  Discharge Instructions     Discharge  instructions   Complete by: As directed    Please follow-up with the drain clinic with [facility] radiology Follow-up with PCP in 1 week  Please call call MD or return to ER for similar  or worsening recurring problem that brought you to hospital or if any fever,nausea/vomiting,abdominal pain, uncontrolled pain, chest pain,  shortness of breath or any other alarming symptoms.  Please follow-up your doctor as instructed in a week time and call the office for appointment.  Please avoid alcohol, smoking, or any other illicit substance and maintain healthy habits including taking your regular medications as prescribed.  You were cared for by a hospitalist during your hospital stay. If you have any questions about your discharge medications or the care you received while you were in the hospital after you are discharged, you can call the unit and ask to speak with the hospitalist on call if the hospitalist that took care of you is not available.  Once you are discharged, your primary care physician will handle any further medical issues. Please note that NO REFILLS for any discharge medications will be authorized once you are discharged, as it is imperative that you return to your primary care physician (or establish a relationship with a primary care physician if you do not have one) for your aftercare needs so that they can reassess your need for medications and monitor your lab values   Discharge wound care:   Complete by: As directed    Reinforce dressing as needed   Increase activity slowly   Complete by: As directed       Allergies as of 08/23/2023       Reactions   Nitrofurantoin Monohyd Macro Anaphylaxis   Throat swelling    Iodinated Contrast Media Hives   Oral CT contrast-chalky on 06-01-16 Face turned red, hot, dizzy   Morphine Other (See Comments)   Hallucinations   Metronidazole Nausea Only        Medication List     STOP taking these medications    HYDROcodone-acetaminophen 5-325 MG tablet Commonly known as: NORCO/VICODIN   vancomycin 125 MG capsule Commonly known as: VANCOCIN       TAKE these medications    Acidophilus Caps capsule Take 1  capsule by mouth 2 (two) times daily.   Ascorbic Acid 100 MG Chew Chew 1 tablet by mouth daily.   b complex vitamins capsule Take 1 capsule by mouth daily.   BD PosiFlush 0.9 % Soln injection Generic drug: sodium chloride flush Use 1 syringe ( ) by Intracatheter route daily.   Calcium Carb-Cholecalciferol 500-15 MG-MCG Tabs Take 1 tablet by mouth daily.   cholecalciferol 10 MCG (400 UNIT) Tabs tablet Commonly known as: VITAMIN D3 Take 400 Units by mouth daily.   Collagen Hydrolysate Powd 1 Dose daily.   famotidine-calcium carbonate-magnesium hydroxide 10-800-165 MG chewable tablet Commonly known as: PEPCID COMPLETE Chew 1 tablet by mouth daily.   feeding supplement Liqd Take 237 mLs by mouth 2 (two) times daily between meals for 14 days.   hydrocortisone 2.5 % rectal cream Commonly known as: ANUSOL-HC Place 1 Application rectally 2 (two) times daily.   Ibuprofen-Acetaminophen 125-250 MG Tabs Take 2 tablets by mouth daily.   lidocaine 2 % jelly Commonly known as: XYLOCAINE Apply 1 Application topically daily.   methocarbamol 500 MG tablet Commonly known as: ROBAXIN Take 1 tablet (500 mg total) by mouth every 8 (eight) hours as needed for up to 30 doses for muscle spasms.   metroNIDAZOLE 500 MG  tablet Commonly known as: FLAGYL Take 1 tablet (500 mg total) by mouth every 12 (twelve) hours.   ondansetron 4 MG tablet Commonly known as: ZOFRAN Take 1 tablet (4 mg total) by mouth every 8 (eight) hours as needed.   oxybutynin 5 MG tablet Commonly known as: DITROPAN Take 1 tablet (5 mg total) by mouth every 8 (eight) hours as needed for up to 15 doses for bladder spasms.   oxyCODONE 5 MG immediate release tablet Commonly known as: Oxy IR/ROXICODONE Take 1 tablet (5 mg total) by mouth every 6 (six) hours as needed for up to 15 days for moderate pain (pain score 4-6).   propranolol 10 MG tablet Commonly known as: INDERAL Take 10 mg by mouth 3 (three) times  daily.   sulfamethoxazole-trimethoprim 800-160 MG tablet Commonly known as: BACTRIM DS Take 2 tablets by mouth daily after breakfast AND 1 tablet at bedtime.               Discharge Care Instructions  (From admission, onward)           Start     Ordered   08/23/23 0000  Discharge wound care:       Comments: Reinforce dressing as needed   08/23/23 1149            Follow-up Information     Karie Soda, MD. Call.   Specialties: General Surgery, Colon and Rectal Surgery Why: follow up to be scheduled after outpatient IR drain study. Please call to confirm appointment time., Arrive 30 minutes early to complete check in, and bring photo ID and insurance card. Contact information: [address] [place] [address] [phone]         Hyman Hopes B, NP Follow up in 1 week(s).   Specialties: Family Medicine, Emergency Medicine Contact information: [address] [place] [address] [phone]                Allergies  Allergen Reactions   Nitrofurantoin Monohyd Macro Anaphylaxis    Throat swelling    Iodinated Contrast Media Hives    Oral CT contrast-chalky on 06-01-16 Face turned red, hot, dizzy   Morphine Other (See Comments)    Hallucinations   Metronidazole Nausea Only    The results of significant diagnostics from this hospitalization (including imaging, microbiology, ancillary and laboratory) are listed below for reference.    Microbiology: Recent Results (from the past 240 hours)  C Difficile Quick Screen w PCR reflex     Status: None   Collection Time: 08/18/23 12:07 PM   Specimen: STOOL  Result Value Ref Range Status   C Diff antigen NEGATIVE NEGATIVE Final   C Diff toxin NEGATIVE NEGATIVE Final   C Diff interpretation No C. difficile detected.  Final    Comment: Performed at [facility], [address], [place], [place] [address]  Aerobic/Anaerobic Culture w Gram Stain (surgical/deep  wound)     Status: None (Preliminary result)   Collection Time: 08/18/23 12:24 PM   Specimen: Abscess  Result Value Ref Range Status   Specimen Description ABSCESS  Final   Special Requests PRESACRAL  Final   Gram Stain   Final    MODERATE WBC PRESENT,BOTH PMN AND MONONUCLEAR FEW GRAM VARIABLE ROD Performed at [facility], [address] [place], [place] [address]    Culture   Final    MODERATE KLEBSIELLA OXYTOCA NO ANAEROBES ISOLATED; CULTURE IN PROGRESS FOR  5 DAYS    Report Status PENDING  Incomplete   Organism ID, Bacteria KLEBSIELLA OXYTOCA  Final      Susceptibility   Klebsiella oxytoca - MIC*    AMPICILLIN >=32 RESISTANT Resistant     CEFEPIME <=0.12 SENSITIVE Sensitive     CEFTAZIDIME <=1 SENSITIVE Sensitive     CEFTRIAXONE <=0.25 SENSITIVE Sensitive     CIPROFLOXACIN <=0.25 SENSITIVE Sensitive     GENTAMICIN <=1 SENSITIVE Sensitive     IMIPENEM 0.5 SENSITIVE Sensitive     TRIMETH/SULFA <=20 SENSITIVE Sensitive     AMPICILLIN/SULBACTAM >=32 RESISTANT Resistant     PIP/TAZO 8 SENSITIVE Sensitive ug/mL    * MODERATE KLEBSIELLA OXYTOCA    Procedures/Studies: CT GUIDED PERITONEAL/RETROPERITONEAL FLUID DRAIN BY PERC CATH Result Date: 08/22/2023 INDICATION: 67 year old woman with complex surgical history including colo-anal anastomosis initially underwent CT-guided presacral abscess drain placement on 08/18/2023. the drain was accidentally dislodged. The collection persists, as does her leukocytosis. EXAM: CT-guided presacral abscess drain placement TECHNIQUE: Multidetector CT imaging of the pelvis was performed following the standard protocol without IV contrast. RADIATION DOSE REDUCTION: This exam was performed according to the departmental dose-optimization program which includes automated exposure control, adjustment of the mA and/or kV according to patient size and/or use of iterative reconstruction technique. MEDICATIONS: The patient is currently admitted to the  hospital and receiving intravenous antibiotics. The antibiotics were administered within an appropriate time frame prior to the initiation of the procedure. ANESTHESIA/SEDATION: Moderate (conscious) sedation was employed during this procedure. A total of Versed 2 mg and Fentanyl 100 mcg was administered intravenously by the radiology nurse. Total intra-service moderate Sedation Time: 20 minutes. The patient's level of consciousness and vital signs were monitored continuously by radiology nursing throughout the procedure under my direct supervision. COMPLICATIONS: None immediate. PROCEDURE: Informed written consent was obtained from the patient after a thorough discussion of the procedural risks, benefits and alternatives. All questions were addressed. Maximal Sterile Barrier Technique was utilized including caps, mask, sterile gowns, sterile gloves, sterile drape, hand hygiene and skin antiseptic. A timeout was performed prior to the initiation of the procedure. Patient positioned prone on the procedure table. The left gluteal skin prepped and draped in the usual sterile fashion. Following local administration, the presacral abscess was accessed with a 17 gauge needle utilizing CT guidance. The needle was removed over 0.035 inch Amplatz guidewire. Serial dilation was performed followed by placement of 10.2 [place] multipurpose pigtail drain. The drain was secured to skin with suture and connected to bulb suction. IMPRESSION: Successful placement of presacral 10.2 French abscess drain. Electronically Signed   By: Acquanetta Belling M.D.   On: 08/22/2023 20:38   CT PELVIS WO CONTRAST Result Date: 08/21/2023 CLINICAL DATA:  Recent percutaneous catheter placement for pelvic abscess. The catheter has come out. Evaluate persistent abscess. EXAM: CT PELVIS WITHOUT CONTRAST TECHNIQUE: Multidetector CT imaging of the pelvis was performed following the standard protocol without intravenous contrast. RADIATION DOSE REDUCTION:  This exam was performed according to the departmental dose-optimization program which includes automated exposure control, adjustment of the mA and/or kV according to patient size and/or use of iterative reconstruction technique. COMPARISON:  CT scan 08/18/2023. FINDINGS: The percutaneous drainage catheter is no longer present. No catheter fragments are seen. Unfortunately, there is a persistent posterior pelvic abscess containing a large amount of gas. It measures approximately 6.5 x 4.8 cm on the sagittal sequence. IMPRESSION: 1. The percutaneous drainage catheter is no longer present. No catheter fragments are seen. 2. Persistent posterior  pelvic abscess containing a large amount of gas. Electronically Signed   By: Rudie Meyer M.D.   On: 08/21/2023 10:55   CT GUIDED PERITONEAL/RETROPERITONEAL FLUID DRAIN BY PERC CATH Result Date: 08/18/2023 INDICATION: 67 year old with a complex surgical history including colo-anal anastomosis. Patient has a presacral/perirectal abscess. Request for percutaneous drainage. EXAM: CT-GUIDED PLACEMENT OF DRAINAGE CATHETER IN PELVIC ABSCESS MEDICATIONS: Moderate sedation ANESTHESIA/SEDATION: Moderate (conscious) sedation was employed during this procedure. A total of Versed 2mg  and fentanyl 100 mcg was administered intravenously at the order of the provider performing the procedure. Total intra-service moderate sedation time: 28 minutes. Patient's level of consciousness and vital signs were monitored continuously by radiology nurse throughout the procedure under the supervision of the provider performing the procedure. COMPLICATIONS: None immediate. PROCEDURE: Informed written consent was obtained from the patient after a thorough discussion of the procedural risks, benefits and alternatives. All questions were addressed. Maximal Sterile Barrier Technique was utilized including caps, mask, sterile gowns, sterile gloves, sterile drape, hand hygiene and skin antiseptic. A timeout  was performed prior to the initiation of the procedure. Patient was placed prone. CT images of the pelvis were obtained. The air-fluid collection in the presacral region was identified and targeted. The right buttock was prepped with chlorhexidine and sterile field was created. Skin was anesthetized using 1% lidocaine. A small incision was made. Using CT guidance, an 18 gauge trocar needle was directed into the presacral air-fluid collection. Purulent fluid was aspirated. Superstiff Amplatz wire was placed. The tract was dilated to accommodate a 10 [place] multipurpose drain. 3 mL of brown/yellow purulent fluid was aspirated along with air. Drain was flushed with saline and attached to a gravity bag. Drain was sutured to the skin. Dressing was placed. RADIATION DOSE REDUCTION: This exam was performed according to the departmental dose-optimization program which includes automated exposure control, adjustment of the mA and/or kV according to patient size and/or use of iterative reconstruction technique. FINDINGS: Air-fluid collection in the presacral region. 10 French drain was placed within the collection and 3 mL of purulent fluid was removed. Collection was partially decompressed at the end of the procedure. IMPRESSION: CT-guided placement of a drainage catheter in the presacral abscess. Electronically Signed   By: Richarda Overlie M.D.   On: 08/18/2023 13:58    Labs: BNP (last 3 results) No results for input(s): "BNP" in the last 8760 hours. Basic Metabolic Panel: Recent Labs  Lab 08/18/23 0554 08/20/23 0555 08/21/23 0559 08/22/23 0558 08/23/23 0559  NA 140 136 140 143 142  K 3.4* 3.4* 3.6 3.6 3.7  CL 105 103 108 109 108  CO2 24 23 24 25 25   GLUCOSE 91 107* 94 97 96  BUN 8 11 12 10 12   CREATININE 0.90 0.68 0.81 0.59 0.67  CALCIUM 8.8* 8.7* 8.4* 8.9 9.0   Liver Function Tests: Recent Labs  Lab 08/18/23 0554  AST 29  ALT 30  ALKPHOS 67  BILITOT 1.1  PROT 6.0*  ALBUMIN 3.0*   No results  for input(s): "LIPASE", "AMYLASE" in the last 168 hours. No results for input(s): "AMMONIA" in the last 168 hours. CBC: Recent Labs  Lab 08/18/23 0554 08/20/23 0555 08/21/23 0559 08/22/23 0558 08/23/23 0559  WBC 11.6* 11.1* 13.0* 12.8* 9.7  HGB 11.1* 10.7* 10.7* 10.9* 10.3*  HCT 34.2* 32.2* 33.1* 33.3* 31.9*  MCV 90.0 87.5 90.4 90.0 89.1  PLT 254 276 298 338 342   Cardiac Enzymes: No results for input(s): "CKTOTAL", "CKMB", "CKMBINDEX", "TROPONINI" in the last 168  hours. BNP: Invalid input(s): "POCBNP" CBG: No results for input(s): "GLUCAP" in the last 168 hours. D-Dimer No results for input(s): "DDIMER" in the last 72 hours. Hgb A1c No results for input(s): "HGBA1C" in the last 72 hours. Lipid Profile No results for input(s): "CHOL", "HDL", "LDLCALC", "TRIG", "CHOLHDL", "LDLDIRECT" in the last 72 hours. Thyroid function studies No results for input(s): "TSH", "T4TOTAL", "T3FREE", "THYROIDAB" in the last 72 hours.  Invalid input(s): "FREET3" Anemia work up No results for input(s): "VITAMINB12", "FOLATE", "FERRITIN", "TIBC", "IRON", "RETICCTPCT" in the last 72 hours. Urinalysis    Component Value Date/Time   COLORURINE YELLOW 03/07/2019 0918   APPEARANCEUR CLEAR 03/07/2019 0918   LABSPEC 1.014 03/07/2019 0918   PHURINE 6.5 03/07/2019 0918   GLUCOSEU NEGATIVE 03/07/2019 0918   HGBUR 1+ (A) 03/07/2019 0918   BILIRUBINUR NEGATIVE 10/25/2016 2341   KETONESUR NEGATIVE 03/07/2019 0918   PROTEINUR NEGATIVE 03/07/2019 0918   UROBILINOGEN 0.2 07/04/2013 0956   NITRITE NEGATIVE 10/25/2016 2341   LEUKOCYTESUR NEGATIVE 10/25/2016 2341   Sepsis Labs Recent Labs  Lab 08/20/23 0555 08/21/23 0559 08/22/23 0558 08/23/23 0559  WBC 11.1* 13.0* 12.8* 9.7   Microbiology Recent Results (from the past 240 hours)  C Difficile Quick Screen w PCR reflex     Status: None   Collection Time: 08/18/23 12:07 PM   Specimen: STOOL  Result Value Ref Range Status   C Diff antigen  NEGATIVE NEGATIVE Final   C Diff toxin NEGATIVE NEGATIVE Final   C Diff interpretation No C. difficile detected.  Final    Comment: Performed at [facility], [address], [place], [place] [address]  Aerobic/Anaerobic Culture w Gram Stain (surgical/deep wound)     Status: None (Preliminary result)   Collection Time: 08/18/23 12:24 PM   Specimen: Abscess  Result Value Ref Range Status   Specimen Description ABSCESS  Final   Special Requests PRESACRAL  Final   Gram Stain   Final    MODERATE WBC PRESENT,BOTH PMN AND MONONUCLEAR FEW GRAM VARIABLE ROD Performed at [facility], [address], [place], [place] [address]    Culture   Final    MODERATE KLEBSIELLA OXYTOCA NO ANAEROBES ISOLATED; CULTURE IN PROGRESS FOR 5 DAYS    Report Status PENDING  Incomplete   Organism ID, Bacteria KLEBSIELLA OXYTOCA  Final      Susceptibility   Klebsiella oxytoca - MIC*    AMPICILLIN >=32 RESISTANT Resistant     CEFEPIME <=0.12 SENSITIVE Sensitive     CEFTAZIDIME <=1 SENSITIVE Sensitive     CEFTRIAXONE <=0.25 SENSITIVE Sensitive     CIPROFLOXACIN <=0.25 SENSITIVE Sensitive     GENTAMICIN <=1 SENSITIVE Sensitive     IMIPENEM 0.5 SENSITIVE Sensitive     TRIMETH/SULFA <=20 SENSITIVE Sensitive     AMPICILLIN/SULBACTAM >=32 RESISTANT Resistant     PIP/TAZO 8 SENSITIVE Sensitive ug/mL    * MODERATE KLEBSIELLA OXYTOCA     Time coordinating discharge: 35 minutes  SIGNED: Lanae Boast, MD  [facility] 08/23/2023, 1:48 PM  If 7PM-7AM, please contact night-coverage [email or web address]

## 2023-08-23 NOTE — Progress Notes (Signed)
[facility]  Date of Admission:  08/17/2023     Lines:  Peripheral iv   Abx: 12/17-c piptazo   12/17-12/18 fidaxomycin                                                             Assessment: 67 y.o. female hx colon cancer (rectal) s/p partial colectomy at the rectal sigmoid area, gerd, recent outside hospital admission 06/11/23 for small bowel obstruction and ct finding proctitis given iv abx in hospital and had spontaneous resolution sbo, hx cdiff colitis (?first episode 08/05/23), here for chronic abd pain now with fever and imaging showed presacral abscess      She has chronic diarrhea and per family report not sure if this has changed. She does have fluctuating formed stool as well The main difference is the the new lower abd pain and proctal pain and along with fever now is explained better by the perirectal abscess   12/6 gi clinic cdiff pcr positive but no eia toxin testing done at that time. And per family no difference in diarrhea with 10 day PO vancomycin   ?12/16 [facility] eia testing was negative per report from primary team   Mild wbc elevation also likely due to rectal abscess     Clinically at this time doesn't appear to be cdiff      ----------------- 08/19/23 id assessment Cdiff eia negative here Diarrhea stable S/p ir drain placement -- significant purulent output visualized afebrile   12/23 id assessment Labs kleb oxytoca  Repeat ct showed 6 cm abscess still -- drain changed (was clogged)  Diarrhea stable     Plan: Ok to dc from id standpoint on oral abx Bactrim ds 2 tab in am, 1 tablet in pm for 8mg /kg/day, along with metronidazole 500 mg po bid Plan 4 weeks but will see in clinic 2 weeks to see if can stop abx (usually plan 5 more days from drain removal with IR) Zofran for n/v Ongoing eval no evidence of cdiff -- ok with probiotics Discussed with primary team  Principal Problem:   Perirectal  abscess Active Problems:   Rectal cancer s/p redo robotic LAR with coloanal anastomosis 07/01/2016   Allergies  Allergen Reactions   Nitrofurantoin Monohyd Macro Anaphylaxis    Throat swelling    Iodinated Contrast Media Hives    Oral CT contrast-chalky on 06-01-16 Face turned red, hot, dizzy   Morphine Other (See Comments)    Hallucinations   Metronidazole Nausea Only    Scheduled Meds:  famotidine  20 mg Oral Daily   feeding supplement  237 mL Oral BID BM   hydrocortisone  1 Application Rectal BID   influenza vaccine adjuvanted  0.5 mL Intramuscular Tomorrow-1000   metroNIDAZOLE  500 mg Oral Q12H   potassium chloride  20 mEq Oral Daily   propranolol  10 mg Oral TID   psyllium  1 packet Oral Daily   saccharomyces boulardii  250 mg Oral BID   senna-docusate  1 tablet Oral QHS   sodium chloride flush  5 mL Intracatheter Q8H   sodium chloride flush  5 mL Intracatheter Q8H   sulfamethoxazole-trimethoprim  1 tablet Oral QHS   [START ON 08/24/2023] sulfamethoxazole-trimethoprim  2 tablet Oral  Daily   Continuous Infusions:   PRN Meds:.acetaminophen **OR** acetaminophen, ibuprofen, lidocaine, methocarbamol, ondansetron (ZOFRAN) IV, mouth rinse, oxybutynin, oxyCODONE, polyethylene glycol   SUBJECTIVE: Drain changed 12/22 Some nausea Diarrhea stable No rash Afebrile    Review of Systems: ROS All other ROS was negative, except mentioned above     OBJECTIVE: Vitals:   08/22/23 1719 08/22/23 2033 08/23/23 0448 08/23/23 0822  BP: 117/68 127/66 114/63 133/87  Pulse: 69 68 67 82  Resp: 20 18 18 18   Temp: 98.6 F (37 C) 98.1 F (36.7 C) 98.3 F (36.8 C) (!) 97.4 F (36.3 C)  TempSrc: Oral Oral  Oral  SpO2: 98% 96% 98% 97%  Weight:      Height:       Body mass index is 32.49 kg/m.  Physical Exam General/constitutional: no distress, pleasant HEENT: Normocephalic, PER, Conj Clear, EOMI, Oropharynx clear Neck supple CV: rrr no mrg Lungs: clear to auscultation,  normal respiratory effort Abd: Soft, Nontender; perc drain showing purulent brown liquid Neuro: nonfocal Skin: no rash  Lab Results Lab Results  Component Value Date   WBC 9.7 08/23/2023   HGB 10.3 (L) 08/23/2023   HCT 31.9 (L) 08/23/2023   MCV 89.1 08/23/2023   PLT 342 08/23/2023    Lab Results  Component Value Date   CREATININE 0.67 08/23/2023   BUN 12 08/23/2023   NA 142 08/23/2023   K 3.7 08/23/2023   CL 108 08/23/2023   CO2 25 08/23/2023    Lab Results  Component Value Date   ALT 30 08/18/2023   AST 29 08/18/2023   ALKPHOS 67 08/18/2023   BILITOT 1.1 08/18/2023      Microbiology: Recent Results (from the past 240 hours)  C Difficile Quick Screen w PCR reflex     Status: None   Collection Time: 08/18/23 12:07 PM   Specimen: STOOL  Result Value Ref Range Status   C Diff antigen NEGATIVE NEGATIVE Final   C Diff toxin NEGATIVE NEGATIVE Final   C Diff interpretation No C. difficile detected.  Final    Comment: Performed at [facility], [address], [place], [place] [address]  Aerobic/Anaerobic Culture w Gram Stain (surgical/deep wound)     Status: None (Preliminary result)   Collection Time: 08/18/23 12:24 PM   Specimen: Abscess  Result Value Ref Range Status   Specimen Description ABSCESS  Final   Special Requests PRESACRAL  Final   Gram Stain   Final    MODERATE WBC PRESENT,BOTH PMN AND MONONUCLEAR FEW GRAM VARIABLE ROD    Culture   Final    MODERATE KLEBSIELLA OXYTOCA NO ANAEROBES ISOLATED Performed at [facility], [address] [place], [place] [address]    Report Status PENDING  Incomplete   Organism ID, Bacteria KLEBSIELLA OXYTOCA  Final      Susceptibility   Klebsiella oxytoca - MIC*    AMPICILLIN >=32 RESISTANT Resistant     CEFEPIME <=0.12 SENSITIVE Sensitive     CEFTAZIDIME <=1 SENSITIVE Sensitive     CEFTRIAXONE <=0.25 SENSITIVE Sensitive     CIPROFLOXACIN <=0.25 SENSITIVE Sensitive     GENTAMICIN <=1 SENSITIVE Sensitive      IMIPENEM 0.5 SENSITIVE Sensitive     TRIMETH/SULFA <=20 SENSITIVE Sensitive     AMPICILLIN/SULBACTAM >=32 RESISTANT Resistant     PIP/TAZO 8 SENSITIVE Sensitive ug/mL    * MODERATE KLEBSIELLA OXYTOCA     Serology:   Imaging: If present, new imagings (plain films, ct scans,  and mri) have been personally visualized and interpreted; radiology reports have been reviewed. Decision making incorporated into the Impression / Recommendations.   12/21 ct pelvis wo contrast FINDINGS: The percutaneous drainage catheter is no longer present. No catheter fragments are seen. Unfortunately, there is a persistent posterior pelvic abscess containing a large amount of gas. It measures approximately 6.5 x 4.8 cm on the sagittal sequence.   IMPRESSION: 1. The percutaneous drainage catheter is no longer present. No catheter fragments are seen. 2. Persistent posterior pelvic abscess containing a large amount of gas.  Raymondo Band, MD [facility] [phone] pager    08/23/2023, 5:11 PM

## 2023-08-23 NOTE — Progress Notes (Addendum)
Progress Note     Subjective: Having some pain at drain site. Still having frequent bowel movements but she describes them as more formed and painful. Having muscle spasms low back and near drain  She describes some visual hallucinations that have been present since having COVID. Occurred yesterday afternoon while watching TV  Sister at bedside  Objective: Vital signs in last 24 hours: Temp:  [97.5 F (36.4 C)-98.6 F (37 C)] 98.3 F (36.8 C) (12/23 0448) Pulse Rate:  [62-76] 67 (12/23 0448) Resp:  [14-20] 18 (12/23 0448) BP: (110-158)/(63-94) 114/63 (12/23 0448) SpO2:  [96 %-100 %] 98 % (12/23 0448) Last BM Date : 08/22/23  Intake/Output from previous day: 12/22 0701 - 12/23 0700 In: 310 [P.O.:90; I.V.:10; IV Piggyback:200] Out: -  Intake/Output this shift: No intake/output data recorded.  PE: General: pleasant, WD, WN female who is laying in bed in NAD Lungs: Respiratory effort nonlabored Abd: soft, NT, ND. Drain with brown tinged bloody fluid. Dressing cdi Psych: A&Ox3 with an appropriate affect.    Lab Results:  Recent Labs    08/22/23 0558 08/23/23 0559  WBC 12.8* 9.7  HGB 10.9* 10.3*  HCT 33.3* 31.9*  PLT 338 342   BMET Recent Labs    08/22/23 0558 08/23/23 0559  NA 143 142  K 3.6 3.7  CL 109 108  CO2 25 25  GLUCOSE 97 96  BUN 10 12  CREATININE 0.59 0.67  CALCIUM 8.9 9.0   PT/INR No results for input(s): "LABPROT", "INR" in the last 72 hours.  CMP     Component Value Date/Time   NA 142 08/23/2023 0559   NA 141 11/18/2016 0820   K 3.7 08/23/2023 0559   K 4.2 11/18/2016 0820   CL 108 08/23/2023 0559   CO2 25 08/23/2023 0559   CO2 26 11/18/2016 0820   GLUCOSE 96 08/23/2023 0559   GLUCOSE 96 11/18/2016 0820   BUN 12 08/23/2023 0559   BUN 18.6 11/18/2016 0820   CREATININE 0.67 08/23/2023 0559   CREATININE 0.59 03/07/2019 1005   CREATININE 0.8 11/18/2016 0820   CALCIUM 9.0 08/23/2023 0559   CALCIUM 9.6 11/18/2016 0820   PROT 6.0  (L) 08/18/2023 0554   PROT 7.0 11/18/2016 0820   ALBUMIN 3.0 (L) 08/18/2023 0554   ALBUMIN 3.2 (L) 11/18/2016 0820   AST 29 08/18/2023 0554   AST 12 11/18/2016 0820   ALT 30 08/18/2023 0554   ALT 10 11/18/2016 0820   ALKPHOS 67 08/18/2023 0554   ALKPHOS 60 11/18/2016 0820   BILITOT 1.1 08/18/2023 0554   BILITOT 0.78 11/18/2016 0820   GFRNONAA >60 08/23/2023 0559   GFRAA >60 10/27/2016 0431   Lipase  No results found for: "LIPASE"     Studies/Results: CT GUIDED PERITONEAL/RETROPERITONEAL FLUID DRAIN BY PERC CATH Result Date: 08/22/2023 INDICATION: 67 year old woman with complex surgical history including colo-anal anastomosis initially underwent CT-guided presacral abscess drain placement on 08/18/2023. the drain was accidentally dislodged. The collection persists, as does her leukocytosis. EXAM: CT-guided presacral abscess drain placement TECHNIQUE: Multidetector CT imaging of the pelvis was performed following the standard protocol without IV contrast. RADIATION DOSE REDUCTION: This exam was performed according to the departmental dose-optimization program which includes automated exposure control, adjustment of the mA and/or kV according to patient size and/or use of iterative reconstruction technique. MEDICATIONS: The patient is currently admitted to the hospital and receiving intravenous antibiotics. The antibiotics were administered within an appropriate time frame prior to the initiation of the procedure. ANESTHESIA/SEDATION:  Moderate (conscious) sedation was employed during this procedure. A total of Versed 2 mg and Fentanyl 100 mcg was administered intravenously by the radiology nurse. Total intra-service moderate Sedation Time: 20 minutes. The patient's level of consciousness and vital signs were monitored continuously by radiology nursing throughout the procedure under my direct supervision. COMPLICATIONS: None immediate. PROCEDURE: Informed written consent was obtained from the  patient after a thorough discussion of the procedural risks, benefits and alternatives. All questions were addressed. Maximal Sterile Barrier Technique was utilized including caps, mask, sterile gowns, sterile gloves, sterile drape, hand hygiene and skin antiseptic. A timeout was performed prior to the initiation of the procedure. Patient positioned prone on the procedure table. The left gluteal skin prepped and draped in the usual sterile fashion. Following local administration, the presacral abscess was accessed with a 17 gauge needle utilizing CT guidance. The needle was removed over 0.035 inch Amplatz guidewire. Serial dilation was performed followed by placement of 10.2 [place] multipurpose pigtail drain. The drain was secured to skin with suture and connected to bulb suction. IMPRESSION: Successful placement of presacral 10.2 French abscess drain. Electronically Signed   By: Acquanetta Belling M.D.   On: 08/22/2023 20:38   CT PELVIS WO CONTRAST Result Date: 08/21/2023 CLINICAL DATA:  Recent percutaneous catheter placement for pelvic abscess. The catheter has come out. Evaluate persistent abscess. EXAM: CT PELVIS WITHOUT CONTRAST TECHNIQUE: Multidetector CT imaging of the pelvis was performed following the standard protocol without intravenous contrast. RADIATION DOSE REDUCTION: This exam was performed according to the departmental dose-optimization program which includes automated exposure control, adjustment of the mA and/or kV according to patient size and/or use of iterative reconstruction technique. COMPARISON:  CT scan 08/18/2023. FINDINGS: The percutaneous drainage catheter is no longer present. No catheter fragments are seen. Unfortunately, there is a persistent posterior pelvic abscess containing a large amount of gas. It measures approximately 6.5 x 4.8 cm on the sagittal sequence. IMPRESSION: 1. The percutaneous drainage catheter is no longer present. No catheter fragments are seen. 2. Persistent  posterior pelvic abscess containing a large amount of gas. Electronically Signed   By: Rudie Meyer M.D.   On: 08/21/2023 10:55     Anti-infectives: Anti-infectives (From admission, onward)    Start     Dose/Rate Route Frequency Ordered Stop   08/21/23 2000  cefTRIAXone (ROCEPHIN) 2 g in sodium chloride 0.9 % 100 mL IVPB        2 g 200 mL/hr over 30 Minutes Intravenous Every 24 hours 08/21/23 1236     08/18/23 0400  piperacillin-tazobactam (ZOSYN) IVPB 3.375 g  Status:  Discontinued        3.375 g 12.5 mL/hr over 240 Minutes Intravenous Every 8 hours 08/17/23 2007 08/21/23 1236   08/17/23 2200  fidaxomicin (DIFICID) tablet 200 mg  Status:  Discontinued        200 mg Oral 2 times daily 08/17/23 1644 08/18/23 1229   08/17/23 1900  piperacillin-tazobactam (ZOSYN) IVPB 3.375 g        3.375 g 100 mL/hr over 30 Minutes Intravenous  Once 08/17/23 1803 08/18/23 0929        Assessment/Plan  High perirectal (colo) abscess Complex surgical hx of LAR for rectal CA, redo LAR with colo-anal anastomosis with chronic diarrhea - IR drain placed 12/18 - came out - IR drain replaced 12/22 - WBC normalized - Cx with moderate Klebsiella oxytoca - ID directing abx -  stable for DC from surgery standpoint - will need follow up with  IR and surgery after drain study - having muscle spasms - robaxin added - fiber decreased from bid to qd - general surgery will be available as needed, please call if acute concerns arise C.Diff antigen positive but toxin negative  Visual hallucinations - alert and oriented with good insight. Did receive fentanyl and sedation yesterday with drain placement  FEN: reg VTE: SCDs ID: [email or web address]. ceftriaxone    LOS: 6 days   I reviewed Consultant IR notes, hospitalist notes, last 24 h vitals and pain scores, last 48 h intake and output, and last 24 h labs and trends.   Eric Form, [facility] 08/23/2023, 8:14 AM Please see Amion for pager number  during day hours 7:00am-4:30pm

## 2023-08-23 NOTE — Plan of Care (Signed)

## 2023-08-24 ENCOUNTER — Telehealth: Payer: Self-pay

## 2023-08-24 NOTE — Transitions of Care (Post Inpatient/ED Visit) (Signed)
08/24/2023  Name: [name] MRN: [id number] DOB: 1956/05/19  Today's TOC FU Call Status: Today's TOC FU Call Status:: Successful TOC FU Call Completed TOC FU Call Complete Date: 08/24/23 Patient's Name and Date of Birth confirmed.  Transition Care Management Follow-up Telephone Call Date of Discharge: 08/23/23 Discharge Facility: [facility]) Type of Discharge: Inpatient Admission Primary Inpatient Discharge Diagnosis:: rectal abscess How have you been since you were released from the hospital?: Better (a little nauseaous with all of my meds.) Any questions or concerns?: No  Items Reviewed: Did you receive and understand the discharge instructions provided?: Yes Medications obtained,verified, and reconciled?: Yes (Medications Reviewed) Any new allergies since your discharge?: No Dietary orders reviewed?: NA Do you have support at home?: Yes People in Home: spouse, child(ren), adult  Medications Reviewed Today: Medications Reviewed Today     Reviewed by Cher Egnor, Jordan Hawks, [name] (Certified Medical Assistant) on 08/24/23 at 1104  Med List Status: <None>   Medication Order Taking? Sig Documenting Provider Last Dose Status Informant  Ascorbic Acid 100 MG CHEW [id number] No Chew 1 tablet by mouth daily. [provider] 08/16/2023 Active Self, Pharmacy Records, Family Member  b complex vitamins capsule [id number] No Take 1 capsule by mouth daily. [provider] 08/16/2023 Active Self, Pharmacy Records, Family Member  Calcium Carb-Cholecalciferol 500-15 MG-MCG TABS [id number] No Take 1 tablet by mouth daily. [provider] 08/16/2023 Active Self, Pharmacy Records, Family Member  cholecalciferol (VITAMIN D3) 10 MCG (400 UNIT) TABS tablet [id number] No Take 400 Units by mouth daily. [provider] 08/16/2023 Active Self, Pharmacy Records, Family Member  Collagen Hydrolysate POWD [id number] No 1 Dose daily. [provider] Past Month  Active Self, Pharmacy Records, Family Member  famotidine-calcium carbonate-magnesium hydroxide (PEPCID COMPLETE) 10-800-165 MG chewable tablet [id number] No Chew 1 tablet by mouth daily. [provider] 08/16/2023 Active Self, Pharmacy Records, Family Member  feeding supplement (ENSURE ENLIVE / ENSURE PLUS) LIQD [id number]  Take 237 mLs by mouth 2 (two) times daily between meals for 14 days. Lanae Boast, MD  Active   hydrocortisone (ANUSOL-HC) 2.5 % rectal cream [id number] No Place 1 Application rectally 2 (two) times daily. [provider] 08/16/2023 Active Self, Pharmacy Records, Family Member  Ibuprofen-Acetaminophen 125-250 MG TABS [id number] No Take 2 tablets by mouth daily. [provider] 08/16/2023 Active Self, Pharmacy Records, Family Member  Lactobacillus (ACIDOPHILUS) CAPS capsule [id number]  Take 1 capsule by mouth 2 (two) times daily. Lanae Boast, MD  Active   lidocaine (XYLOCAINE) 2 % jelly [id number] No Apply 1 Application topically daily. [provider] 08/16/2023 Active Self, Pharmacy Records, Family Member  methocarbamol (ROBAXIN) 500 MG tablet [id number]  Take 1 tablet (500 mg total) by mouth every 8 (eight) hours as needed for up to 30 doses for muscle spasms. Lanae Boast, MD  Active   metroNIDAZOLE (FLAGYL) 500 MG tablet [id number]  Take 1 tablet (500 mg total) by mouth every 12 (twelve) hours. Lanae Boast, MD  Active   ondansetron (ZOFRAN) 4 MG tablet [id number]  Take 1 tablet (4 mg total) by mouth every 8 (eight) hours as needed. Lanae Boast, MD  Active   oxybutynin (DITROPAN) 5 MG tablet [id number]  Take 1 tablet (5 mg total) by mouth every 8 (eight) hours as needed for up to 15 doses for bladder spasms. Lanae Boast, MD  Active   oxyCODONE (OXY IR/ROXICODONE) 5 MG immediate release tablet [id number]  Take 1 tablet (5 mg total) by mouth every 6 (six) hours  as needed for up to 15 days for moderate pain (pain score 4-6). Lanae Boast, MD  Active   propranolol  (INDERAL) 10 MG tablet [id number] No Take 10 mg by mouth 3 (three) times daily. [provider] 08/16/2023 Active Self, Pharmacy Records, Family Member  sodium chloride flush 0.9 % SOLN injection [id number]  Use 1 syringe ( ) by Intracatheter route daily. Mickie Kay, NP  Active   sulfamethoxazole-trimethoprim (BACTRIM DS) 800-160 MG tablet [id number]  Take 2 tablets by mouth daily after breakfast AND 1 tablet at bedtime. Raymondo Band, MD  Active             Home Care and Equipment/Supplies: Were Home Health Services Ordered?: NA Any new equipment or medical supplies ordered?: NA  Functional Questionnaire: Do you need assistance with bathing/showering or dressing?: Yes Do you need assistance with meal preparation?: Yes Do you need assistance with eating?: No Do you have difficulty maintaining continence: No Do you need assistance with getting out of bed/getting out of a chair/moving?: Yes Do you have difficulty managing or taking your medications?: No  Follow up appointments reviewed: PCP Follow-up appointment confirmed?: Yes Date of PCP follow-up appointment?: 08/31/23 Follow-up Provider: Hyman Hopes NP Specialist Hospital Follow-up appointment confirmed?: NA Do you need transportation to your follow-up appointment?: No Do you understand care options if your condition(s) worsen?: Yes-patient verbalized understanding  Unable to get patient seen within the 7 day calendar window due to holidays  Abby Kalya Troeger, [name]  [facility] AWV Team Direct Dial: [phone]

## 2023-08-25 LAB — AEROBIC/ANAEROBIC CULTURE W GRAM STAIN (SURGICAL/DEEP WOUND)

## 2023-08-27 ENCOUNTER — Other Ambulatory Visit: Payer: Self-pay | Admitting: Surgery

## 2023-08-27 DIAGNOSIS — K611 Rectal abscess: Secondary | ICD-10-CM

## 2023-08-31 ENCOUNTER — Encounter: Payer: Self-pay | Admitting: Family Medicine

## 2023-08-31 ENCOUNTER — Ambulatory Visit: Payer: BC Managed Care – PPO | Admitting: Family Medicine

## 2023-08-31 ENCOUNTER — Inpatient Hospital Stay: Payer: BC Managed Care – PPO | Admitting: Family Medicine

## 2023-08-31 VITALS — BP 94/46 | HR 77 | Temp 97.3°F

## 2023-08-31 DIAGNOSIS — K611 Rectal abscess: Secondary | ICD-10-CM | POA: Diagnosis not present

## 2023-08-31 DIAGNOSIS — R110 Nausea: Secondary | ICD-10-CM | POA: Diagnosis not present

## 2023-08-31 DIAGNOSIS — Z09 Encounter for follow-up examination after completed treatment for conditions other than malignant neoplasm: Secondary | ICD-10-CM | POA: Diagnosis not present

## 2023-08-31 LAB — BASIC METABOLIC PANEL
BUN: 18 mg/dL (ref 6–23)
CO2: 21 meq/L (ref 19–32)
Calcium: 9.7 mg/dL (ref 8.4–10.5)
Chloride: 102 meq/L (ref 96–112)
Creatinine, Ser: 0.95 mg/dL (ref 0.40–1.20)
GFR: 62.2 mL/min (ref >60.00)
Glucose, Bld: 85 mg/dL (ref 70–99)
Potassium: 4.2 meq/L (ref 3.5–5.1)
Sodium: 135 meq/L (ref 135–145)

## 2023-08-31 LAB — CBC WITH DIFFERENTIAL/PLATELET
Basophils Absolute: 0.1 10*3/uL (ref 0.0–0.1)
Basophils Relative: 0.6 % (ref 0.0–3.0)
Eosinophils Absolute: 0.1 10*3/uL (ref 0.0–0.7)
Eosinophils Relative: 1 % (ref 0.0–5.0)
HCT: 39.1 % (ref 36.0–46.0)
Hemoglobin: 12.7 g/dL (ref 12.0–15.0)
Lymphocytes Relative: 19.8 % (ref 12.0–46.0)
Lymphs Abs: 2.3 10*3/uL (ref 0.7–4.0)
MCHC: 32.6 g/dL (ref 30.0–36.0)
MCV: 89.9 fL (ref 78.0–100.0)
Monocytes Absolute: 0.7 10*3/uL (ref 0.1–1.0)
Monocytes Relative: 6.4 % (ref 3.0–12.0)
Neutro Abs: 8.5 10*3/uL — ABNORMAL HIGH (ref 1.4–7.7)
Neutrophils Relative %: 72.2 % (ref 43.0–77.0)
Platelets: 482 10*3/uL — ABNORMAL HIGH (ref 150.0–400.0)
RBC: 4.35 Mil/uL (ref 3.87–5.11)
RDW: 14.4 % (ref 11.5–15.5)
WBC: 11.7 10*3/uL — ABNORMAL HIGH (ref 4.0–10.5)

## 2023-08-31 MED ORDER — ONDANSETRON HCL 4 MG PO TABS: 4.0000 mg | ORAL_TABLET | Freq: Three times a day (TID) | ORAL | 0 refills | Status: AC | PRN

## 2023-08-31 NOTE — Progress Notes (Signed)
 Acute Office Visit  Subjective:     Patient ID: [name], female    DOB: 04-21-1956, 67 y.o.   MRN: [id number]  Chief Complaint  Patient presents with   Hospitalization Follow-up    Perirectal abscess     HPI Patient is in today for  hospital follow-up. She is here today with her spouse.     Patient was recently admitted at [facility] from December 17 through December 23.  She has a history of bowel resection, colostomy with reversal x 2, rectal tumor, hypertension, GERD, recurrent C. difficile.  She initially went to the ED for fever and confusion and was found to have a perirectal abscess and met sepsis criteria.  She had a CT-guided drainage catheter placed and infectious disease was consulted.  CT showed 6.5 x 4.8 cm pelvic abscess with large amount of gas and her drain was reinserted on 12/22 after accidentally removed the day prior.  She was started on Flagyl  and Bactrim  per culture results and is supposed to follow-up with infectious disease next week.  Patient reports she has not noticed much improvement since being home.  She is still needing to take the oxycodone  for pain in the rectal area along with Zofran  for nausea which does help temporarily.  She admits to minimal oral intake and hydration due to the nausea and pain.  Yesterday she was able to eat a little bit of chicken and Jell-O.  She continues to have loose bowel movements which has been a chronic issues for her, but she had negative C. difficile testing in the hospital.  She has not noted any blood in her stool.  Reports her urination is normal.  Reports her drain is doing well and still in place, output remains green/brown.  She is having to empty the drain once a day.  She is disappointed that she is still not feeling much better.  She has not been checking her vital signs at home but does not feel like she has had any fevers.  Her biggest complaint is the nausea and rectal pain.        ROS All  review of systems negative except what is listed in the HPI      Objective:    BP (!) 94/46   Pulse 77   Temp (!) 97.3 F (36.3 C) (Oral)   SpO2 98%    Physical Exam Vitals reviewed.  Constitutional:      General: She is not in acute distress.    Appearance: Normal appearance. She is ill-appearing.  HENT:     Mouth/Throat:     Mouth: Mucous membranes are moist.  Cardiovascular:     Rate and Rhythm: Normal rate and regular rhythm.  Pulmonary:     Effort: Pulmonary effort is normal.  Abdominal:     General: Abdomen is flat. Bowel sounds are normal. There is no distension.     Tenderness: There is no abdominal tenderness. There is no guarding.  Skin:    General: Skin is warm and dry.  Neurological:     Mental Status: She is alert and oriented to person, place, and time.  Psychiatric:        Mood and Affect: Mood normal.        Behavior: Behavior normal.        Thought Content: Thought content normal.        Judgment: Judgment normal.           Assessment &  Plan:   Problem List Items Addressed This Visit       Active Problems   Perirectal abscess   Relevant Orders   Basic metabolic panel (Completed)   CBC with Differential/Platelet (Completed)   Other Visit Diagnoses       Nausea    -  Primary   Relevant Medications   ondansetron  (ZOFRAN ) 4 MG tablet     Hospital discharge follow-up       Relevant Medications   ondansetron  (ZOFRAN ) 4 MG tablet   Other Relevant Orders   Basic metabolic panel (Completed)   CBC with Differential/Platelet (Completed)       Drain in place with greenish dark drainage. Patient reports feeling unwell with intermittent nausea and inability to eat. No fever reported. Currently on Bactrim  and Flagyl . -Continue current antibiotics - susceptible per culture results. Following with ID - sees them next week.  -Check blood cell counts and metabolic panel today. -Advise patient to monitor blood pressure at home and seek immediate  medical attention if feeling worse or if blood pressure stays below 90 systolic. -Refill Zofran  for nausea control. -Advise patient to maintain bland diet and increase fluid intake to prevent dehydration. Patient has appointments with infectious disease specialist on 09/07/2023 and surgeon on 09/23/2023. CT of abdomen scheduled for 09/10/2023.   Strict ED precautions discussed.     Meds ordered this encounter  Medications   ondansetron  (ZOFRAN ) 4 MG tablet    Sig: Take 1 tablet (4 mg total) by mouth every 8 (eight) hours as needed.    Dispense:  20 tablet    Refill:  0    Supervising Provider:   DOMENICA BLACKBIRD A [[id number]]    Return if symptoms worsen or fail to improve.  Waddell KATHEE Mon, NP

## 2023-09-07 ENCOUNTER — Encounter: Payer: Self-pay | Admitting: Internal Medicine

## 2023-09-07 ENCOUNTER — Other Ambulatory Visit: Payer: Self-pay

## 2023-09-07 ENCOUNTER — Ambulatory Visit (INDEPENDENT_AMBULATORY_CARE_PROVIDER_SITE_OTHER): Payer: BC Managed Care – PPO | Admitting: Internal Medicine

## 2023-09-07 VITALS — BP 117/76 | HR 95 | Temp 97.8°F | Ht 63.0 in | Wt 169.0 lb

## 2023-09-07 DIAGNOSIS — K611 Rectal abscess: Secondary | ICD-10-CM

## 2023-09-07 NOTE — Patient Instructions (Signed)
 Will see what radiology says this week  Video visit with me next week  If a fistula is present/well defined and no free flowing abscess, we could consider getting you off antibiotics   Labs today

## 2023-09-07 NOTE — Progress Notes (Signed)
 [facility]  Patient Active Problem List   Diagnosis Date Noted   Perirectal abscess 08/19/2023   Lumbar radiculopathy 05/29/2023   Spinal stenosis of lumbar region 05/25/2023   Combined forms of age-related cataract of left eye 09/15/2022   Combined forms of age-related cataract of right eye 09/08/2022   History of malignant neoplasm of colon 11/15/2020   Gastro-esophageal reflux disease without esophagitis 11/15/2020   C. 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   MRN: [id number]  Chief Complaint  Patient presents with   Follow-up    HPI:  [name] is a 68 y.o. female hx colon cancer (rectal) s/p partial colectomy at the rectal sigmoid area, gerd, recent outside hospital admission 06/11/23 for small bowel obstruction and ct finding proctitis given iv abx in hospital and had spontaneous resolution sbo, hx cdiff colitis (?first episode 08/05/23), here for chronic abd pain now with fever and imaging showed presacral abscess    Allergies  Allergen Reactions   Nitrofurantoin Monohyd Macro Anaphylaxis    Throat swelling    Iodinated Contrast Media Hives    Oral CT contrast-chalky on 06-01-16 Face turned red, hot, dizzy   Morphine  Other (See Comments)    Hallucinations   Metronidazole   Nausea Only      Outpatient Medications Prior to Visit  Medication Sig Dispense Refill   Ascorbic Acid 100 MG CHEW Chew 1 tablet by mouth daily.     b complex vitamins capsule Take 1 capsule by mouth daily.     Calcium  Carb-Cholecalciferol 500-15 MG-MCG TABS Take 1 tablet by mouth daily.     cholecalciferol (VITAMIN D3) 10 MCG (400 UNIT) TABS tablet Take 400 Units by mouth daily.     famotidine -calcium  carbonate-magnesium  hydroxide (PEPCID  COMPLETE) 10-800-165 MG chewable tablet Chew 1 tablet by mouth daily.     hydrocortisone  (ANUSOL -HC) 2.5 % rectal cream Place 1 Application rectally 2 (two) times daily.     Ibuprofen -Acetaminophen  125-250 MG TABS Take 2 tablets by mouth daily.     Lactobacillus (ACIDOPHILUS) CAPS capsule Take 1 capsule by mouth 2 (two) times daily. 100 capsule 0   lidocaine  (XYLOCAINE ) 2 % jelly Apply 1 Application topically daily.     metroNIDAZOLE  (FLAGYL ) 500 MG tablet Take 1 tablet (500 mg total) by mouth every 12 (twelve) hours. 60 tablet 0   ondansetron  (ZOFRAN ) 4 MG tablet Take 1 tablet (4 mg total) by mouth every 8 (eight) hours as needed. 20 tablet 0   propranolol  (INDERAL ) 10 MG tablet Take 10 mg by mouth 3 (three) times daily.     sodium chloride  flush 0.9 % SOLN injection Use 1 syringe ( ) by Intracatheter route daily. 300 mL 3   sulfamethoxazole -trimethoprim  (BACTRIM  DS) 800-160 MG tablet Take 2 tablets by mouth daily after breakfast AND 1 tablet at  bedtime. 90 tablet 0   Collagen Hydrolysate POWD 1 Dose daily. (Patient not taking: Reported on 09/07/2023)     methocarbamol  (ROBAXIN ) 500 MG tablet Take 1 tablet (500 mg total) by mouth every 8 (eight) hours as needed for up to 30 doses for muscle spasms. (Patient not taking: Reported on 09/07/2023) 30 tablet 0   oxybutynin  (DITROPAN ) 5 MG tablet Take 1 tablet (5 mg total) by mouth every 8 (eight) hours as needed for up to 15 doses for bladder spasms. (Patient not taking: Reported on 09/07/2023) 15 tablet 0    oxyCODONE  (OXY IR/ROXICODONE ) 5 MG immediate release tablet Take 1 tablet (5 mg total) by mouth every 6 (six) hours as needed for up to 15 days for moderate pain (pain score 4-6). (Patient not taking: Reported on 09/07/2023) 15 tablet 0   No facility-administered medications prior to visit.     Social History   Socioeconomic History   Marital status: Married    Spouse name: Not on file   Number of children: Not on file   Years of education: Not on file   Highest education level: Not on file  Occupational History   Not on file  Tobacco Use   Smoking status: Never   Smokeless tobacco: Never  Vaping Use   Vaping status: Never Used  Substance and Sexual Activity   Alcohol use: No   Drug use: No   Sexual activity: Yes    Birth control/protection: Surgical, Post-menopausal    Comment: BSO; First IC >16 y/o, <5 Partners, DES-neg  Other Topics Concern   Not on file  Social History Narrative   Married, husband [employer]chant [employer]   Employed as dog groomer   Has #3 grown children   Social Drivers of Corporate Investment Banker Strain: Low Risk  (10/06/2022)   Received from [employer], [facility]   Overall Financial Resource Strain (CARDIA)    Difficulty of Paying Living Expenses: Not very hard  Food Insecurity: No Food Insecurity (08/17/2023)   Hunger Vital Sign    Worried About Running Out of Food in the Last Year: Never true    Ran Out of Food in the Last Year: Never true  Transportation Needs: No Transportation Needs (08/17/2023)   PRAPARE - Administrator, Civil Service (Medical): No    Lack of Transportation (Non-Medical): No  Physical Activity: Not on file  Stress: No Stress Concern Present (06/12/2023)   Received from [facility] - Occupational Stress Questionnaire    Feeling of Stress : Not at all  Social Connections: Unknown (01/08/2022)   Received from [facility], [facility]   Social Network    Social Network: Not  on file  Intimate Partner Violence: Not At Risk (08/17/2023)   Humiliation, Afraid, Rape, and Kick questionnaire    Fear of Current or Ex-Partner: No    Emotionally Abused: No    Physically Abused: No    Sexually Abused: No      Review of Systems     Objective:    Ht 5' 3 (1.6 m)   Wt 169 lb (76.7 kg)   BMI 29.94 kg/m  Nursing note and vital signs reviewed.  Physical Exam      General/constitutional: no distress, pleasant HEENT: Normocephalic, PER, Conj Clear, EOMI, Oropharynx clear Neck supple CV: rrr no mrg Lungs: clear to auscultation, normal respiratory effort Abd: Soft, Nontender Ext: no edema Gu:  Left buttock drain intact, purulent discharge  in drain; purulence at insertion site -- no cellulitis  Dressing changed  Labs: Lab Results  Component Value Date   WBC 11.7 (H) 08/31/2023   HGB 12.7 08/31/2023   HCT 39.1 08/31/2023   MCV 89.9 08/31/2023   PLT 482.0 (H) 08/31/2023   Last metabolic panel Lab Results  Component Value Date   GLUCOSE 85 08/31/2023   NA 135 08/31/2023   K 4.2 08/31/2023   CL 102 08/31/2023   CO2 21 08/31/2023   BUN 18 08/31/2023   CREATININE 0.95 08/31/2023   GFR 62.20 08/31/2023   CALCIUM  9.7 08/31/2023   PROT 6.0 (L) 08/18/2023   ALBUMIN  3.0 (L) 08/18/2023   BILITOT 1.1 08/18/2023   ALKPHOS 67 08/18/2023   AST 29 08/18/2023   ALT 30 08/18/2023   ANIONGAP 9 08/23/2023   No results found for: ESRSEDRATE, POCTSEDRATE No results found for: CRP  Micro:  Serology:  Imaging: Reviewed   08/21/23 pelvis ct FINDINGS: The percutaneous drainage catheter is no longer present. No catheter fragments are seen. Unfortunately, there is a persistent posterior pelvic abscess containing a large amount of gas. It measures approximately 6.5 x 4.8 cm on the sagittal sequence.   IMPRESSION: 1. The percutaneous drainage catheter is no longer present. No catheter fragments are seen. 2. Persistent posterior pelvic abscess  containing a large amount of gas.    Assessment & Plan:   Problem List Items Addressed This Visit       Digestive   Perirectal abscess - Primary   Relevant Orders   CBC   COMPLETE METABOLIC PANEL WITH GFR      No orders of the defined types were placed in this encounter.    Abx: 12/23-c bactrim /flagyl   12/17-12/23 piptazo   12/17-12/18 fidaxomycin                                                             Assessment: 68 y.o. female hx colon cancer (rectal) s/p partial colectomy at the rectal sigmoid area, gerd, recent outside hospital admission 06/11/23 for small bowel obstruction and ct finding proctitis given iv abx in hospital and had spontaneous resolution sbo, hx cdiff colitis (?first episode 08/05/23), here for chronic abd pain now with fever and imaging showed presacral abscess    From 08/2023 hospital admission: She has chronic diarrhea and per family report not sure if this has changed. She does have fluctuating formed stool as well The main difference is the the new lower abd pain and proctal pain and along with fever now is explained better by the perirectal abscess   12/6 gi clinic cdiff pcr positive but no eia toxin testing done at that time. And per family no difference in diarrhea with 10 day PO vancomycin    ?12/16 [facility] eia testing was negative per report from primary team   Diarrhea was stable and admission Cdiff eia negative here. Empiric po vanc stopped  S/p ir drain placement 12/18 -> changed on 08/22/23 for clogging; 12/18 cx from drain grew kleb oxytoca (R amp/sulbactam) and proteus mirabilis   09/07/23 id clinic assessment Drain is is still putting out purulent material Nausea is bad from abx -- has zofran  Stable chronic diarrhea Will see radiology this Friday  There is some purulent leakage around the insertion site -- last  week the drain was caught when she stood from bathroom and draining happened since. It appears to still be  functioning but might have pulled away from some walled of compartment of the abscess. Vs ?fistula  My plan is if a fistula is present and all walled off I might observe her off antibiotics  See me in 10 more days -- video visit  Cbc/cmp     Plan: Ok to dc from id standpoint on oral abx Bactrim  ds 2 tab in am, 1 tablet in pm for 8mg /kg/day, along with metronidazole  500 mg po bid Plan 4 weeks but will see in clinic 2 weeks to see if can stop abx (usually plan 5 more days from drain removal with IR) Zofran  for n/v Ongoing eval no evidence of cdiff -- ok with probiotics Discussed with primary team   Follow-up: Return in about 10 days (around 09/17/2023).      Constance ONEIDA Passer, MD [facility] 09/07/2023, 11:07 AM
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
## 2023-09-10 ENCOUNTER — Ambulatory Visit
Admission: RE | Admit: 2023-09-10 | Discharge: 2023-09-10 | Disposition: A | Discharge status: Home / Self Care | Payer: BC Managed Care – PPO | Source: Ambulatory Visit | Attending: Surgery | Admitting: Surgery

## 2023-09-10 ENCOUNTER — Ambulatory Visit
Admission: RE | Admit: 2023-09-10 | Discharge: 2023-09-10 | Disposition: A | Discharge status: Home / Self Care | Payer: BC Managed Care – PPO | Source: Ambulatory Visit | Attending: Student

## 2023-09-10 DIAGNOSIS — N200 Calculus of kidney: Secondary | ICD-10-CM | POA: Diagnosis not present

## 2023-09-10 DIAGNOSIS — K611 Rectal abscess: Secondary | ICD-10-CM

## 2023-09-10 DIAGNOSIS — N838 Other noninflammatory disorders of ovary, fallopian tube and broad ligament: Secondary | ICD-10-CM | POA: Diagnosis not present

## 2023-09-10 DIAGNOSIS — K439 Ventral hernia without obstruction or gangrene: Secondary | ICD-10-CM | POA: Diagnosis not present

## 2023-09-10 HISTORY — PX: IR RADIOLOGIST EVAL & MGMT: IMG5224

## 2023-09-10 MED ORDER — IOPAMIDOL (ISOVUE-300) INJECTION 61%
100.0000 mL | Freq: Once | INTRAVENOUS | Status: AC | PRN
  Administered 2023-09-10: 100 mL via INTRAVENOUS

## 2023-09-13 ENCOUNTER — Other Ambulatory Visit: Payer: Self-pay | Admitting: Surgery

## 2023-09-13 DIAGNOSIS — K611 Rectal abscess: Secondary | ICD-10-CM

## 2023-09-16 ENCOUNTER — Other Ambulatory Visit: Payer: Self-pay

## 2023-09-16 ENCOUNTER — Telehealth (INDEPENDENT_AMBULATORY_CARE_PROVIDER_SITE_OTHER): Payer: BC Managed Care – PPO | Admitting: Internal Medicine

## 2023-09-16 DIAGNOSIS — K611 Rectal abscess: Secondary | ICD-10-CM | POA: Diagnosis not present

## 2023-09-16 NOTE — Progress Notes (Signed)
[facility]  Patient Active Problem List   Diagnosis Date Noted   Perirectal abscess 08/19/2023   Lumbar radiculopathy 05/29/2023   Spinal stenosis of lumbar region 05/25/2023   Combined forms of age-related cataract of left eye 09/15/2022   Combined forms of age-related cataract of right eye 09/08/2022   History of malignant neoplasm of colon 11/15/2020   Gastro-esophageal reflux disease without esophagitis 11/15/2020   C. 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   MRN: [id number]  Chief Complaint  Patient presents with   Follow-up    HPI:  [name] is a 68 y.o. female hx colon cancer (rectal) s/p partial colectomy at the rectal sigmoid area, gerd, recent outside hospital admission 06/11/23 for small bowel obstruction and ct finding proctitis given iv abx in hospital and had spontaneous resolution sbo, hx cdiff colitis (?first episode 08/05/23), here for chronic abd pain now with fever and imaging showed presacral abscess    09/16/23 video visit See a&p for detail   Allergies  Allergen Reactions   Nitrofurantoin Monohyd Macro Anaphylaxis    Throat swelling    Iodinated Contrast Media Hives    Oral CT contrast-chalky on 06-01-16 Face turned red, hot, dizzy   Morphine Other (See  Comments)    Hallucinations   Metronidazole Nausea Only      Outpatient Medications Prior to Visit  Medication Sig Dispense Refill   Ascorbic Acid 100 MG CHEW Chew 1 tablet by mouth daily.     b complex vitamins capsule Take 1 capsule by mouth daily.     Calcium Carb-Cholecalciferol 500-15 MG-MCG TABS Take 1 tablet by mouth daily.     cholecalciferol (VITAMIN D3) 10 MCG (400 UNIT) TABS tablet Take 400 Units by mouth daily.     famotidine-calcium carbonate-magnesium hydroxide (PEPCID COMPLETE) 10-800-165 MG chewable tablet Chew 1 tablet by mouth daily.     hydrocortisone (ANUSOL-HC) 2.5 % rectal cream Place 1 Application rectally 2 (two) times daily.     Ibuprofen-Acetaminophen 125-250 MG TABS Take 2 tablets by mouth daily.     Lactobacillus (ACIDOPHILUS) CAPS capsule Take 1 capsule by mouth 2 (two) times daily. 100 capsule 0   lidocaine (XYLOCAINE) 2 % jelly Apply 1 Application topically daily.     metroNIDAZOLE (FLAGYL) 500 MG tablet Take 1 tablet (500 mg total) by mouth every 12 (twelve) hours. 60 tablet 0   ondansetron (ZOFRAN) 4 MG tablet Take 1 tablet (4 mg total) by mouth every 8 (eight) hours as needed. 20 tablet 0   propranolol (INDERAL) 10 MG tablet Take 10 mg by mouth 3 (three) times daily.     sodium chloride flush 0.9 % SOLN injection Use 1 syringe ( ) by Intracatheter route daily. 300 mL 3   sulfamethoxazole-trimethoprim (BACTRIM DS) 800-160 MG tablet Take 2 tablets  by mouth daily after breakfast AND 1 tablet at bedtime. 90 tablet 0   Collagen Hydrolysate POWD 1 Dose daily. (Patient not taking: Reported on 09/07/2023)     methocarbamol (ROBAXIN) 500 MG tablet Take 1 tablet (500 mg total) by mouth every 8 (eight) hours as needed for up to 30 doses for muscle spasms. (Patient not taking: Reported on 09/16/2023) 30 tablet 0   oxybutynin (DITROPAN) 5 MG tablet Take 1 tablet (5 mg total) by mouth every 8 (eight) hours as needed for up to 15 doses for bladder spasms. (Patient not  taking: Reported on 09/16/2023) 15 tablet 0   No facility-administered medications prior to visit.     Social History   Socioeconomic History   Marital status: Married    Spouse name: Not on file   Number of children: Not on file   Years of education: Not on file   Highest education level: Not on file  Occupational History   Not on file  Tobacco Use   Smoking status: Never   Smokeless tobacco: Never  Vaping Use   Vaping status: Never Used  Substance and Sexual Activity   Alcohol use: No   Drug use: No   Sexual activity: Yes    Birth control/protection: Surgical, Post-menopausal    Comment: BSO; First IC >16 y/o, <5 Partners, DES-neg  Other Topics Concern   Not on file  Social History Narrative   Married, husband Merchant navy officer   Employed as dog groomer   Has #3 grown children   Social Drivers of Corporate investment banker Strain: Low Risk  (10/06/2022)   Received from [facility], [facility]   Overall Financial Resource Strain (CARDIA)    Difficulty of Paying Living Expenses: Not very hard  Food Insecurity: No Food Insecurity (08/17/2023)   Hunger Vital Sign    Worried About Running Out of Food in the Last Year: Never true    Ran Out of Food in the Last Year: Never true  Transportation Needs: No Transportation Needs (08/17/2023)   PRAPARE - Administrator, Civil Service (Medical): No    Lack of Transportation (Non-Medical): No  Physical Activity: Not on file  Stress: No Stress Concern Present (06/12/2023)   Received from [facility] - Occupational Stress Questionnaire    Feeling of Stress : Not at all  Social Connections: Unknown (01/08/2022)   Received from [facility], [facility]   Social Network    Social Network: Not on file  Intimate Partner Violence: Not At Risk (08/17/2023)   Humiliation, Afraid, Rape, and Kick questionnaire    Fear of Current or Ex-Partner: No    Emotionally Abused: No     Physically Abused: No    Sexually Abused: No      Review of Systems     Objective:    There were no vitals taken for this visit. Nursing note and vital signs reviewed.  Physical Exam      General/constitutional: no distress, pleasant HEENT: Normocephalic, PER, Conj Clear, EOMI, Oropharynx clear Resp: normal respiratory effort  Video visit today 09/16/23  Labs: Lab Results  Component Value Date   WBC 11.7 (H) 08/31/2023   HGB 12.7 08/31/2023   HCT 39.1 08/31/2023   MCV 89.9 08/31/2023   PLT 482.0 (H) 08/31/2023   Last metabolic panel Lab Results  Component Value Date   GLUCOSE 85 08/31/2023   NA 135 08/31/2023   K 4.2 08/31/2023  CL 102 08/31/2023   CO2 21 08/31/2023   BUN 18 08/31/2023   CREATININE 0.95 08/31/2023   GFR 62.20 08/31/2023   CALCIUM 9.7 08/31/2023   PROT 6.0 (L) 08/18/2023   ALBUMIN 3.0 (L) 08/18/2023   BILITOT 1.1 08/18/2023   ALKPHOS 67 08/18/2023   AST 29 08/18/2023   ALT 30 08/18/2023   ANIONGAP 9 08/23/2023   No results found for: "ESRSEDRATE", "POCTSEDRATE" No results found for: "CRP"  Micro:  Serology:  Imaging: Reviewed   08/21/23 pelvis ct FINDINGS: The percutaneous drainage catheter is no longer present. No catheter fragments are seen. Unfortunately, there is a persistent posterior pelvic abscess containing a large amount of gas. It measures approximately 6.5 x 4.8 cm on the sagittal sequence.   IMPRESSION: 1. The percutaneous drainage catheter is no longer present. No catheter fragments are seen. 2. Persistent posterior pelvic abscess containing a large amount of gas.    09/10/23 dg sinus check FINDINGS: Drain injection demonstrates small residual fluid collection in the posteroinferior pelvis around the indwelling pigtail drainage catheter. There is inferior propagation of contrast which could represent rectal fistulization.   IMPRESSION: Persistent small perirectal fluid collection with possible  rectal fistula.   PLAN: The patient was instructed to stop flushing the drain. The patient was switched from suction bulb to gravity bag drainage. The patient will be scheduled for drain injection in 2-3 weeks.  Assessment & Plan:   Problem List Items Addressed This Visit     Perirectal abscess - Primary       No orders of the defined types were placed in this encounter.    Abx: 12/23-c bactrim/flagyl  12/17-12/23 piptazo   12/17-12/18 fidaxomycin                                                             Assessment: 68 y.o. female hx colon cancer (rectal) s/p partial colectomy at the rectal sigmoid area, gerd, recent outside hospital admission 06/11/23 for small bowel obstruction and ct finding proctitis given iv abx in hospital and had spontaneous resolution sbo, hx cdiff colitis (?first episode 08/05/23), here for chronic abd pain now with fever and imaging showed presacral abscess    From 08/2023 hospital admission: She has chronic diarrhea and per family report not sure if this has changed. She does have fluctuating formed stool as well The main difference is the the new lower abd pain and proctal pain and along with fever now is explained better by the perirectal abscess   12/6 gi clinic cdiff pcr positive but no eia toxin testing done at that time. And per family no difference in diarrhea with 10 day PO vancomycin   ?12/16 [facility] eia testing was negative per report from primary team   Diarrhea was stable and admission Cdiff eia negative here. Empiric po vanc stopped  S/p ir drain placement 12/18 -> changed on 08/22/23 for clogging; 12/18 cx from drain grew kleb oxytoca (R amp/sulbactam) and proteus mirabilis   09/07/23 id clinic assessment Drain is is still putting out purulent material Nausea is bad from abx -- has zofran Stable chronic diarrhea Will see radiology this Friday  There is some purulent leakage around the insertion site -- last week  the drain was caught when she stood from bathroom and draining  happened since. It appears to still be functioning but might have pulled away from some walled of compartment of the abscess. Vs ?fistula  My plan is if a fistula is present and all walled off I might observe her off antibiotics  See me in 10 more days -- video visit  Cbc/cmp   --------------- 09/16/23 id assessment 09/10/23 ir visit much improved perirectal fluid collection, but possible rectal fistula (colo-abscess) She is feeling very dizzy/nauseated with abx  She has 1 more week of pills on hands No f/c   -stop metronidazole today -finish 1 more week of bactrim -- she has been on long enough and improving size and the drain functioning -video visit first week after next ir visit -- if concerning sign/sx of worsening abscess with sepsis/sirs will consider restarting abx -standing cbc, cmp, crp for next week   I connected with  [name] on 09/16/2023  I verified that I was speaking with the correct person using two identifiers. Due to the COVID-19 Pandemic/nature of visit, this service was provided via telemedicine using audio/visual media.   The patient was located at home. The provider was located in the office. The patient did consent to this visit and is aware of charges through their insurance as well as the limitations of evaluation and management by telemedicine. Other persons participating in this telemedicine service were none. Time spent on visit was greater than 25 minutes on media and in coordination of care   Follow-up: Return in about 3 weeks (around 10/07/2023).      Raymondo Band, MD [facility] 09/16/2023, 10:36 AM

## 2023-09-16 NOTE — Patient Instructions (Signed)
Standing labs to be done next week    Video visit again first week february

## 2023-09-24 ENCOUNTER — Other Ambulatory Visit: Payer: Self-pay

## 2023-09-24 ENCOUNTER — Other Ambulatory Visit: Payer: BC Managed Care – PPO

## 2023-09-24 DIAGNOSIS — K611 Rectal abscess: Secondary | ICD-10-CM

## 2023-09-25 LAB — COMPLETE METABOLIC PANEL WITH GFR
AG Ratio: 1.7 (calc) (ref 1.0–2.5)
ALT: 9 U/L (ref 6–29)
AST: 14 U/L (ref 10–35)
Albumin: 4.3 g/dL (ref 3.6–5.1)
Alkaline phosphatase (APISO): 55 U/L (ref 37–153)
BUN: 19 mg/dL (ref 7–25)
CO2: 26 mmol/L (ref 20–32)
Calcium: 9.7 mg/dL (ref 8.6–10.4)
Chloride: 105 mmol/L (ref 98–110)
Creat: 0.8 mg/dL (ref 0.50–1.05)
Globulin: 2.5 g/dL (ref 1.9–3.7)
Glucose, Bld: 93 mg/dL (ref 65–99)
Potassium: 4.1 mmol/L (ref 3.5–5.3)
Sodium: 141 mmol/L (ref 135–146)
Total Bilirubin: 0.4 mg/dL (ref 0.2–1.2)
Total Protein: 6.8 g/dL (ref 6.1–8.1)
eGFR: 81 mL/min/{1.73_m2} (ref >=60)

## 2023-09-25 LAB — CBC
HCT: 38.2 % (ref 35.0–45.0)
Hemoglobin: 12.5 g/dL (ref 11.7–15.5)
MCH: 28.8 pg (ref 27.0–33.0)
MCHC: 32.7 g/dL (ref 32.0–36.0)
MCV: 88 fL (ref 80.0–100.0)
MPV: 8.7 fL (ref 7.5–12.5)
Platelets: 376 10*3/uL (ref 140–400)
RBC: 4.34 10*6/uL (ref 3.80–5.10)
RDW: 13.4 % (ref 11.0–15.0)
WBC: 8.5 10*3/uL (ref 3.8–10.8)

## 2023-09-25 LAB — C-REACTIVE PROTEIN: CRP: 5.8 mg/L (ref <8.0)

## 2023-09-28 ENCOUNTER — Encounter: Payer: Self-pay | Admitting: Internal Medicine
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
## 2023-10-01 ENCOUNTER — Ambulatory Visit
Admission: RE | Admit: 2023-10-01 | Discharge: 2023-10-01 | Disposition: A | Discharge status: Home / Self Care | Payer: BC Managed Care – PPO | Source: Ambulatory Visit | Attending: Surgery | Admitting: Surgery

## 2023-10-01 DIAGNOSIS — K611 Rectal abscess: Secondary | ICD-10-CM

## 2023-10-01 HISTORY — PX: IR RADIOLOGIST EVAL & MGMT: IMG5224

## 2023-10-05 ENCOUNTER — Other Ambulatory Visit: Payer: Self-pay

## 2023-10-05 ENCOUNTER — Telehealth (INDEPENDENT_AMBULATORY_CARE_PROVIDER_SITE_OTHER): Payer: BC Managed Care – PPO | Admitting: Internal Medicine

## 2023-10-05 DIAGNOSIS — K611 Rectal abscess: Secondary | ICD-10-CM

## 2023-10-05 MED ORDER — HYDROCODONE-ACETAMINOPHEN 5-325 MG PO TABS: 1.0000 | ORAL_TABLET | Freq: Four times a day (QID) | ORAL | 0 refills | Status: DC | PRN

## 2023-10-05 MED ORDER — HYDROCODONE-ACETAMINOPHEN 5-325 MG PO TABS: 1.0000 | ORAL_TABLET | Freq: Four times a day (QID) | ORAL | 0 refills | Status: AC | PRN

## 2023-10-05 NOTE — Progress Notes (Addendum)
[facility]  Patient Active Problem List   Diagnosis Date Noted   Perirectal abscess 08/19/2023   Lumbar radiculopathy 05/29/2023   Spinal stenosis of lumbar region 05/25/2023   Combined forms of age-related cataract of left eye 09/15/2022   Combined forms of age-related cataract of right eye 09/08/2022   History of malignant neoplasm of colon 11/15/2020   Gastro-esophageal reflux disease without esophagitis 11/15/2020   C. 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   MRN: [id number]  Chief Complaint  Patient presents with   Follow-up    Perirectal abscess - patient reports she is sore around tube site/hip.       HPI:  [name] is a 68 y.o. female hx colon cancer (rectal) s/p partial colectomy at the rectal sigmoid area, gerd, recent outside hospital admission 06/11/23 for small bowel obstruction and ct finding proctitis given iv abx in hospital and had spontaneous resolution sbo, hx cdiff colitis (?first episode 08/05/23), here for chronic abd pain now with fever and imaging showed presacral abscess   10/05/23 id clinic visit Televisit Off abx 3 weeks ago Ir visit 1/30 notes reviewed -- fistula present still and drain left 1 week sacral/rectal pain No f/c    Allergies  Allergen  Reactions   Nitrofurantoin Monohyd Macro Anaphylaxis    Throat swelling    Iodinated Contrast Media Hives    Oral CT contrast-chalky on 06-01-16 Face turned red, hot, dizzy   Morphine Other (See Comments)    Hallucinations   Metronidazole Nausea Only      Outpatient Medications Prior to Visit  Medication Sig Dispense Refill   Ascorbic Acid 100 MG CHEW Chew 1 tablet by mouth daily.     b complex vitamins capsule Take 1 capsule by mouth daily.     Calcium Carb-Cholecalciferol 500-15 MG-MCG TABS Take 1 tablet by mouth daily.     cholecalciferol (VITAMIN D3) 10 MCG (400 UNIT) TABS tablet Take 400 Units by mouth daily.     famotidine-calcium carbonate-magnesium hydroxide (PEPCID COMPLETE) 10-800-165 MG chewable tablet Chew 1 tablet by mouth daily.     hydrocortisone (ANUSOL-HC) 2.5 % rectal cream Place 1 Application rectally 2 (two) times daily.     Ibuprofen-Acetaminophen 125-250 MG TABS Take 2 tablets by mouth daily.     lidocaine (XYLOCAINE) 2 % jelly Apply 1 Application topically daily.     propranolol (INDERAL) 10 MG tablet Take 10 mg by mouth 3 (three) times daily.     sodium chloride flush 0.9 % SOLN injection Use 1 syringe ( ) by Intracatheter route daily. 300 mL 3   Collagen Hydrolysate POWD 1 Dose daily. (Patient not taking: Reported on 09/07/2023)     methocarbamol (ROBAXIN) 500 MG tablet Take 1 tablet (500 mg total) by mouth every 8 (eight) hours as needed  for up to 30 doses for muscle spasms. (Patient not taking: Reported on 09/07/2023) 30 tablet 0   oxybutynin (DITROPAN) 5 MG tablet Take 1 tablet (5 mg total) by mouth every 8 (eight) hours as needed for up to 15 doses for bladder spasms. (Patient not taking: Reported on 09/07/2023) 15 tablet 0   No facility-administered medications prior to visit.     Social History   Socioeconomic History   Marital status: Married    Spouse name: Not on file   Number of children: Not on file   Years of education: Not on file   Highest  education level: Not on file  Occupational History   Not on file  Tobacco Use   Smoking status: Never   Smokeless tobacco: Never  Vaping Use   Vaping status: Never Used  Substance and Sexual Activity   Alcohol use: No   Drug use: No   Sexual activity: Yes    Birth control/protection: Surgical, Post-menopausal    Comment: BSO; First IC >16 y/o, <5 Partners, DES-neg  Other Topics Concern   Not on file  Social History Narrative   Married, husband Merchant navy officer   Employed as dog groomer   Has #3 grown children   Social Drivers of Corporate investment banker Strain: Low Risk  (10/06/2022)   Received from [facility], [facility]   Overall Financial Resource Strain (CARDIA)    Difficulty of Paying Living Expenses: Not very hard  Food Insecurity: No Food Insecurity (08/17/2023)   Hunger Vital Sign    Worried About Running Out of Food in the Last Year: Never true    Ran Out of Food in the Last Year: Never true  Transportation Needs: No Transportation Needs (08/17/2023)   PRAPARE - Administrator, Civil Service (Medical): No    Lack of Transportation (Non-Medical): No  Physical Activity: Not on file  Stress: No Stress Concern Present (06/12/2023)   Received from [facility] - Occupational Stress Questionnaire    Feeling of Stress : Not at all  Social Connections: Unknown (01/08/2022)   Received from [facility], [facility]   Social Network    Social Network: Not on file  Intimate Partner Violence: Not At Risk (08/17/2023)   Humiliation, Afraid, Rape, and Kick questionnaire    Fear of Current or Ex-Partner: No    Emotionally Abused: No    Physically Abused: No    Sexually Abused: No      Review of Systems    All other ros negative   Objective:    There were no vitals taken for this visit. Nursing note and vital signs reviewed.  Physical Exam      General/constitutional: no distress, pleasant HEENT:  Normocephalic, PER, Conj Clear, EOMI, Oropharynx clear Resp: normal respiratory effort  Video visit today 10/05/23  Labs: Lab Results  Component Value Date   WBC 8.5 09/24/2023   HGB 12.5 09/24/2023   HCT 38.2 09/24/2023   MCV 88.0 09/24/2023   PLT 376 09/24/2023   Last metabolic panel Lab Results  Component Value Date   GLUCOSE 93 09/24/2023   NA 141 09/24/2023   K 4.1 09/24/2023   CL 105 09/24/2023   CO2 26 09/24/2023   BUN 19 09/24/2023   CREATININE 0.80 09/24/2023   GFR 62.20 08/31/2023   CALCIUM 9.7 09/24/2023   PROT 6.8 09/24/2023   ALBUMIN 3.0 (L) 08/18/2023   BILITOT 0.4 09/24/2023   ALKPHOS  67 08/18/2023   AST 14 09/24/2023   ALT 9 09/24/2023   ANIONGAP 9 08/23/2023   No results found for: "ESRSEDRATE", "POCTSEDRATE" Lab Results  Component Value Date   CRP 5.8 09/24/2023    Micro:  Serology:  Imaging: Reviewed   08/21/23 pelvis ct FINDINGS: The percutaneous drainage catheter is no longer present. No catheter fragments are seen. Unfortunately, there is a persistent posterior pelvic abscess containing a large amount of gas. It measures approximately 6.5 x 4.8 cm on the sagittal sequence.   IMPRESSION: 1. The percutaneous drainage catheter is no longer present. No catheter fragments are seen. 2. Persistent posterior pelvic abscess containing a large amount of gas.    09/10/23 dg sinus check FINDINGS: Drain injection demonstrates small residual fluid collection in the posteroinferior pelvis around the indwelling pigtail drainage catheter. There is inferior propagation of contrast which could represent rectal fistulization.   IMPRESSION: Persistent small perirectal fluid collection with possible rectal fistula.   PLAN: The patient was instructed to stop flushing the drain. The patient was switched from suction bulb to gravity bag drainage. The patient will be scheduled for drain injection in 2-3 weeks.  Assessment & Plan:   Problem List  Items Addressed This Visit     Perirectal abscess - Primary   Relevant Orders   CBC w/Diff   COMPLETE METABOLIC PANEL WITH GFR   C-reactive protein        No orders of the defined types were placed in this encounter.    Abx: 12/23-c bactrim/flagyl  12/17-12/23 piptazo   12/17-12/18 fidaxomycin                                                             Assessment: 68 y.o. female hx colon cancer (rectal) s/p partial colectomy at the rectal sigmoid area, gerd, recent outside hospital admission 06/11/23 for small bowel obstruction and ct finding proctitis given iv abx in hospital and had spontaneous resolution sbo, hx cdiff colitis (?first episode 08/05/23), here for chronic abd pain now with fever and imaging showed presacral abscess    From 08/2023 hospital admission: She has chronic diarrhea and per family report not sure if this has changed. She does have fluctuating formed stool as well The main difference is the the new lower abd pain and proctal pain and along with fever now is explained better by the perirectal abscess   12/6 gi clinic cdiff pcr positive but no eia toxin testing done at that time. And per family no difference in diarrhea with 10 day PO vancomycin   ?12/16 [facility] eia testing was negative per report from primary team   Diarrhea was stable and admission Cdiff eia negative here. Empiric po vanc stopped  S/p ir drain placement 12/18 -> changed on 08/22/23 for clogging; 12/18 cx from drain grew kleb oxytoca (R amp/sulbactam) and proteus mirabilis   09/07/23 id clinic assessment Drain is is still putting out purulent material Nausea is bad from abx -- has zofran Stable chronic diarrhea Will see radiology this Friday  There is some purulent leakage around the insertion site -- last week the drain was caught when she stood from bathroom and draining happened since. It appears to still be functioning but might have pulled away from some walled of  compartment of the  abscess. Vs ?fistula  My plan is if a fistula is present and all walled off I might observe her off antibiotics  See me in 10 more days -- video visit  Cbc/cmp   --------------- 09/16/23 id assessment 09/10/23 ir visit much improved perirectal fluid collection, but possible rectal fistula (colo-abscess) She is feeling very dizzy/nauseated with abx  She has 1 more week of pills on hands No f/c   -stop metronidazole today -finish 1 more week of bactrim -- she has been on long enough and improving size and the drain functioning -video visit first week after next ir visit -- if concerning sign/sx of worsening abscess with sepsis/sirs will consider restarting abx -standing cbc, cmp, crp for next week   10/05/23 video visit assessment 1/30 ir visit drain kept in; presence of fistula. Much less output Off bactrim since 3 weeks ago 1 week perirectal/sacral pain No fever, chill Appetite stable   I worry about redevelopement of abscess and will have her check labs this week and have in person visit early next week  Still wants to avoid abx given cdiff colonization  Vicodin prn severe pain; advise nsaids use first  Advise her to call ir and see if repeat imaging needed earlier Chart sent to dr Elby Showers   I connected with  [name] on 10/05/2023  I verified that I was speaking with the correct person using two identifiers. Due to the COVID-19 Pandemic/nature of visit, this service was provided via telemedicine using audio/visual media.   The patient was located at home. The provider was located in the office. The patient did consent to this visit and is aware of charges through their insurance as well as the limitations of evaluation and management by telemedicine. Other persons participating in this telemedicine service were none. Time spent on visit was greater than 25 minutes on media and in coordination of care   Follow-up: No follow-ups on  file.   ------ Addendum post visit Discussed with dr Elby Showers of IR and given new worsening pain off abx and with fistula remaining will get abd pelv ct with contrast    Raymondo Band, MD [facility] 10/05/2023, 2:05 PM

## 2023-10-05 NOTE — Addendum Note (Signed)
Addended by: Rutha Bouchard T on: 10/05/2023 04:12 PM   Modules accepted: Orders

## 2023-10-08 ENCOUNTER — Other Ambulatory Visit: Payer: Self-pay

## 2023-10-08 ENCOUNTER — Other Ambulatory Visit: Payer: BC Managed Care – PPO

## 2023-10-08 DIAGNOSIS — K611 Rectal abscess: Secondary | ICD-10-CM | POA: Diagnosis not present

## 2023-10-09 LAB — CBC WITH DIFFERENTIAL/PLATELET
Absolute Lymphocytes: 2336 {cells}/uL (ref 850–3900)
Absolute Monocytes: 611 {cells}/uL (ref 200–950)
Basophils Absolute: 57 {cells}/uL (ref 0–200)
Basophils Relative: 0.8 %
Eosinophils Absolute: 92 {cells}/uL (ref 15–500)
Eosinophils Relative: 1.3 %
HCT: 37.4 % (ref 35.0–45.0)
Hemoglobin: 12.2 g/dL (ref 11.7–15.5)
MCH: 29 pg (ref 27.0–33.0)
MCHC: 32.6 g/dL (ref 32.0–36.0)
MCV: 89 fL (ref 80.0–100.0)
MPV: 8.7 fL (ref 7.5–12.5)
Monocytes Relative: 8.6 %
Neutro Abs: 4004 {cells}/uL (ref 1500–7800)
Neutrophils Relative %: 56.4 %
Platelets: 338 10*3/uL (ref 140–400)
RBC: 4.2 10*6/uL (ref 3.80–5.10)
RDW: 13.4 % (ref 11.0–15.0)
Total Lymphocyte: 32.9 %
WBC: 7.1 10*3/uL (ref 3.8–10.8)

## 2023-10-09 LAB — COMPLETE METABOLIC PANEL WITH GFR
AG Ratio: 1.5 (calc) (ref 1.0–2.5)
ALT: 11 U/L (ref 6–29)
AST: 17 U/L (ref 10–35)
Albumin: 4 g/dL (ref 3.6–5.1)
Alkaline phosphatase (APISO): 57 U/L (ref 37–153)
BUN: 24 mg/dL (ref 7–25)
CO2: 30 mmol/L (ref 20–32)
Calcium: 9.8 mg/dL (ref 8.6–10.4)
Chloride: 107 mmol/L (ref 98–110)
Creat: 0.69 mg/dL (ref 0.50–1.05)
Globulin: 2.6 g/dL (ref 1.9–3.7)
Glucose, Bld: 97 mg/dL (ref 65–99)
Potassium: 4 mmol/L (ref 3.5–5.3)
Sodium: 144 mmol/L (ref 135–146)
Total Bilirubin: 0.5 mg/dL (ref 0.2–1.2)
Total Protein: 6.6 g/dL (ref 6.1–8.1)
eGFR: 95 mL/min/{1.73_m2} (ref >=60)

## 2023-10-09 LAB — C-REACTIVE PROTEIN: CRP: <3 mg/L (ref <8.0)

## 2023-10-12 ENCOUNTER — Ambulatory Visit (HOSPITAL_COMMUNITY)
Admission: RE | Admit: 2023-10-12 | Discharge: 2023-10-12 | Disposition: A | Discharge status: Home / Self Care | Payer: BC Managed Care – PPO | Source: Ambulatory Visit | Attending: Internal Medicine | Admitting: Internal Medicine

## 2023-10-12 DIAGNOSIS — N739 Female pelvic inflammatory disease, unspecified: Secondary | ICD-10-CM | POA: Diagnosis not present

## 2023-10-12 DIAGNOSIS — Z9049 Acquired absence of other specified parts of digestive tract: Secondary | ICD-10-CM | POA: Diagnosis not present

## 2023-10-12 DIAGNOSIS — K611 Rectal abscess: Secondary | ICD-10-CM | POA: Diagnosis not present

## 2023-10-12 DIAGNOSIS — K439 Ventral hernia without obstruction or gangrene: Secondary | ICD-10-CM | POA: Diagnosis not present

## 2023-10-12 DIAGNOSIS — K604 Rectal fistula, unspecified: Secondary | ICD-10-CM | POA: Diagnosis not present

## 2023-10-12 DIAGNOSIS — Z85048 Personal history of other malignant neoplasm of rectum, rectosigmoid junction, and anus: Secondary | ICD-10-CM | POA: Diagnosis not present

## 2023-10-12 LAB — POCT I-STAT CREATININE: Creatinine, Ser: 0.7 mg/dL (ref 0.44–1.00)

## 2023-10-12 MED ORDER — IOHEXOL 350 MG/ML SOLN
75.0000 mL | Freq: Once | INTRAVENOUS | Status: AC | PRN
  Administered 2023-10-12: 75 mL via INTRAVENOUS

## 2023-10-13 ENCOUNTER — Telehealth: Payer: Self-pay

## 2023-10-13 NOTE — Telephone Encounter (Signed)
----- Message from Raymondo Band sent at 10/13/2023  1:59 PM EST ----- Regarding: RE: clinical update Thanks Dr Michaell Cowing  I will include Dr shuttle here   She has new perirectal pain so the CT was obtained   But given CT, I agree to continue monitoring off abx   If she remains sx free for more than 2 to 3 months it would be a cure   Thank you all   ------- [facility]  Her labs remain normal and CT looks good  She could follow up with it regarding the drain  She can follow up with [place] as needed   Thanks ----- Message ----- From: Karie Soda, MD Sent: 10/13/2023   1:51 PM EST To: Ethlyn Gallery; Bennie Dallas, MD; # Subject: RE: clinical update                            Patient status post rectal abscess after flex sig biopsy status post PERC drainage and IV antibiotics.  Distant history of LAR x 2 for rectal polyp and then rectal cancer last surgery 2018.  I am looking the CAT scan and looks like the drain is migrated into the gluteal region.  There is no more abscess.    In my mind I think the drain can come out.  However, the CT scan has not been read.  If this is a radiology tech telling the patient to call us instead managing the drain that the their department put him, I would like to know who the tech is and talk to them.  That is not how the protocol goes usually.    If the radiologist wants to call me and let me know or actually read it, I am willing to make a decision.  Usually the patients discharged with a percutaneous IR placed drain are scheduled for follow-up in the IR drain clinic, get an injection; and, then have the drain removed by IR that put the drain in the abscess has resolved and there is no more fistula..  That has been the general protocol.  If they do not have time this week to get the drain out, patient can come to our office to have it removed instead.  Just cut the white drain tubing and pull the pigtail out.  Often there is a black nylon tail involved that  comes out easily as well.    Also, looking the chart, ID implies that they cannot see my note from yesterday.  It is in Care Everywhere.  The patient feels fine and has not had any symptoms or problems off antibiotics for the past 3 weeks.  There is no recurrent abscess.  I did not see that ID needed to keep following the patient necessarily when the patient asked me & I was not aware that there was another appointment with them.  Certainly if ID still wants to have another follow-up appt with patient, I am fine with that.  If the patient gets recurrent pain and symptoms, she will need a CT scan repeated with oral and IV contrast to see if she has got a recurrent abscess.  Hopefully not too likely at this point but nothing is 100%  Your regularly humbled and sometimes obedient surgeon,  SG  Ardeth Sportsman, MD, FACS, MASCRS Esophageal, Gastrointestinal & Colorectal Surgery Robotic and Minimally Invasive Surgery [facility] [phone] MOBILE ----- Message ----- From:  Phylliss Blakes Sent: 10/13/2023  11:18 AM EST To: Karie Soda, MD; Ethlyn Gallery Subject: clinical update                                Hx of rectal fistula    [place] Simmons(HIPAA), pt's daughter called regarding this pt. States pt had a CT completed yesterday and was instructed by radiology to call the office today regarding results. Reports they told her and the pt that the drain needs to be removed. Asking for recommendations/advice from SG.    Informed [place] that the CT hasn't been resulted yet and it only states exam ended per notes in [facility]. Informed her that SG wasn't in the office today but I would send him a msg. Told her once I received a response I would call her back w/ understanding verbalized. Brittany's call back number is [phone].   The CT hasn't been resulted yet but there are images available. Please advise

## 2023-10-13 NOTE — Telephone Encounter (Addendum)
Patient and her daughter [name] are aware. Patient will contact surgeons office regarding drain removal.   Charene Mccallister P Kaycee Haycraft, [name]

## 2023-10-13 NOTE — Telephone Encounter (Signed)
Patient daughter called stating that the patient seen Dr.Gross yesterday and was told that she doesn't need to follow up with ID anymore. Pt daughter asked if we could see the notes from Dr.Gross office I stated we could but there aren't currently any notes as the provider may not have finished the office note yet. Pt daughter wanted to make Dr.Vu aware.   Chrishawna Farina Lesli Albee, [name]

## 2023-10-13 NOTE — Telephone Encounter (Signed)
If she feels well that's fine. She doesn't need to see [place]   Surprised Gross said that. But ultimately true, the surgeon can take care of the abscess

## 2023-10-14 ENCOUNTER — Other Ambulatory Visit: Payer: Self-pay | Admitting: Internal Medicine

## 2023-10-14 DIAGNOSIS — K611 Rectal abscess: Secondary | ICD-10-CM

## 2023-10-15 ENCOUNTER — Ambulatory Visit: Payer: BC Managed Care – PPO | Admitting: Internal Medicine
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
## 2023-10-18 ENCOUNTER — Other Ambulatory Visit: Payer: BC Managed Care – PPO

## 2023-10-18 ENCOUNTER — Ambulatory Visit
Admission: RE | Admit: 2023-10-18 | Discharge: 2023-10-18 | Disposition: A | Discharge status: Home / Self Care | Payer: BC Managed Care – PPO | Source: Ambulatory Visit | Attending: Internal Medicine | Admitting: Internal Medicine

## 2023-10-18 ENCOUNTER — Ambulatory Visit
Admission: RE | Admit: 2023-10-18 | Discharge: 2023-10-18 | Disposition: A | Discharge status: Home / Self Care | Payer: BC Managed Care – PPO | Source: Ambulatory Visit | Attending: Internal Medicine

## 2023-10-18 ENCOUNTER — Ambulatory Visit: Payer: BC Managed Care – PPO | Admitting: Internal Medicine

## 2023-10-18 ENCOUNTER — Other Ambulatory Visit: Payer: Self-pay | Admitting: Internal Medicine

## 2023-10-18 DIAGNOSIS — K611 Rectal abscess: Secondary | ICD-10-CM | POA: Diagnosis not present

## 2023-10-18 DIAGNOSIS — Z4803 Encounter for change or removal of drains: Secondary | ICD-10-CM | POA: Diagnosis not present

## 2023-10-18 DIAGNOSIS — K5720 Diverticulitis of large intestine with perforation and abscess without bleeding: Secondary | ICD-10-CM | POA: Diagnosis not present

## 2023-10-18 DIAGNOSIS — Z09 Encounter for follow-up examination after completed treatment for conditions other than malignant neoplasm: Secondary | ICD-10-CM | POA: Diagnosis not present

## 2023-10-18 HISTORY — PX: IR RADIOLOGIST EVAL & MGMT: IMG5224

## 2023-10-18 MED ORDER — IOPAMIDOL (ISOVUE-300) INJECTION 61%
100.0000 mL | Freq: Once | INTRAVENOUS | Status: AC | PRN
Start: 2023-10-18 — End: 2023-10-18
  Administered 2023-10-18: 100 mL via INTRAVENOUS

## 2023-10-18 NOTE — Telephone Encounter (Signed)
 Patient's daughter called with same concern. Advised that ID does not typically make decisions regarding drain and recommended she reach out to surgeon's office.   Sandie Ano, RN

## 2023-10-22 ENCOUNTER — Telehealth: Payer: Self-pay | Admitting: Gastroenterology

## 2023-10-22 NOTE — Telephone Encounter (Signed)
 Good afternoon Dr. Chales Abrahams  The following patient is asking to transfer to [place] from [facility] for severe diarrhea. She is not happy with the care being provided and wishes to seek your care per her pcp. Records are available with care everywhere. Please review and advise of scheduling. Thank you

## 2023-10-26 NOTE — Telephone Encounter (Signed)
 Just had colonoscopy done 6 months ago at [facility]-I cannot see the full report I would suggest to carry on with [facility] for now.  Otherwise, we have to start all over again If she has problems, she should go to [facility] or [facility] for another opinion.

## 2023-11-01 ENCOUNTER — Telehealth: Payer: Self-pay | Admitting: Family Medicine

## 2023-11-01 NOTE — Telephone Encounter (Signed)
 Patient asked for second opinion with Dr. Chales Abrahams - looks like this is message from him in mychart:   [name], MD to [name]  [facility]   10/26/23 10:02 AM Note Just had colonoscopy done 6 months ago at [facility]-I cannot see the full report I would suggest to carry on with [facility] for now.  Otherwise, we have to start all over again If she has problems, she should go to [facility] or [facility] for another opinion.     Please advise.

## 2023-11-01 NOTE — Telephone Encounter (Signed)
 Tried to call patient to relay message from Dr. Urban Gibson office. No answer. VM full. If she calls back can relay message.

## 2023-11-01 NOTE — Telephone Encounter (Signed)
 Please inform patient of his message. If she wants [place] to send a referral to [facility] or [facility] instead that is fine.

## 2023-11-01 NOTE — Telephone Encounter (Signed)
 Copied from CRM [id number]. Topic: Referral - Request for Referral >> Nov 01, 2023 10:06 AM [name] wrote: Did the patient discuss referral with their provider in the last year? Yes (If No - schedule appointment) (If Yes - send message)  Appointment offered? No  Type of order/referral and detailed reason for visit: Gastroenterology   Preference of office, provider, location: [name] [address] [place], [place] [address] [phone]  If referral order, have you been seen by this specialty before? Yes, [name] (If Yes, this issue or another issue? When? Where?  Can we respond through MyChart? Yes

## 2023-11-02 ENCOUNTER — Encounter: Payer: Self-pay | Admitting: Neurology

## 2023-12-23 DIAGNOSIS — M5442 Lumbago with sciatica, left side: Secondary | ICD-10-CM | POA: Diagnosis not present

## 2023-12-24 ENCOUNTER — Other Ambulatory Visit: Payer: Self-pay | Admitting: Orthopedic Surgery

## 2023-12-24 DIAGNOSIS — M545 Low back pain, unspecified: Secondary | ICD-10-CM

## 2024-01-06 ENCOUNTER — Inpatient Hospital Stay: Admission: RE | Admit: 2024-01-06 | Source: Ambulatory Visit

## 2024-01-11 ENCOUNTER — Telehealth: Payer: Self-pay | Admitting: Family Medicine

## 2024-01-11 NOTE — Telephone Encounter (Signed)
 They had been faxing this request to [facility]) fax number. We did receive form today and faxed this with confirmation received.

## 2024-01-11 NOTE — Telephone Encounter (Signed)
 Copied from CRM [id number]. Topic: General - Other >> Jan 11, 2024 11:22 AM [name] wrote: Reason for CRM: [facility] [place] is going to fax the diagnostic mammogram imagining order to the clinic again to be signed; patients appointment is tomorrow(01/12/2024) please follow up with the [facility] [phone]

## 2024-01-14 ENCOUNTER — Other Ambulatory Visit

## 2024-01-21 ENCOUNTER — Ambulatory Visit
Admission: RE | Admit: 2024-01-21 | Discharge: 2024-01-21 | Disposition: A | Discharge status: Home / Self Care | Source: Ambulatory Visit | Attending: Orthopedic Surgery | Admitting: Orthopedic Surgery

## 2024-01-21 DIAGNOSIS — M545 Low back pain, unspecified: Secondary | ICD-10-CM

## 2024-01-21 DIAGNOSIS — M48061 Spinal stenosis, lumbar region without neurogenic claudication: Secondary | ICD-10-CM | POA: Diagnosis not present

## 2024-01-21 DIAGNOSIS — M419 Scoliosis, unspecified: Secondary | ICD-10-CM | POA: Diagnosis not present

## 2024-02-07 ENCOUNTER — Encounter: Payer: Self-pay | Admitting: Family Medicine

## 2024-02-19 DIAGNOSIS — M5416 Radiculopathy, lumbar region: Secondary | ICD-10-CM | POA: Diagnosis not present

## 2024-02-21 DIAGNOSIS — M5416 Radiculopathy, lumbar region: Secondary | ICD-10-CM | POA: Diagnosis not present

## 2024-02-21 NOTE — Telephone Encounter (Addendum)
 Tried to contact patient, no answer, vm full, unable to leave a message. Will try again later.   [place] - FYI

## 2024-02-28 NOTE — Telephone Encounter (Signed)
 Unable to reach patient. Letter mailed.

## 2024-03-09 DIAGNOSIS — M5416 Radiculopathy, lumbar region: Secondary | ICD-10-CM | POA: Diagnosis not present

## 2024-03-31 DIAGNOSIS — M48062 Spinal stenosis, lumbar region with neurogenic claudication: Secondary | ICD-10-CM | POA: Diagnosis not present

## 2024-03-31 DIAGNOSIS — M5416 Radiculopathy, lumbar region: Secondary | ICD-10-CM | POA: Diagnosis not present

## 2024-04-12 DIAGNOSIS — K529 Noninfective gastroenteritis and colitis, unspecified: Secondary | ICD-10-CM | POA: Diagnosis not present

## 2024-04-12 DIAGNOSIS — R251 Tremor, unspecified: Secondary | ICD-10-CM | POA: Diagnosis not present

## 2024-04-13 DIAGNOSIS — M5416 Radiculopathy, lumbar region: Secondary | ICD-10-CM | POA: Diagnosis not present

## 2024-07-02 ENCOUNTER — Encounter: Payer: Self-pay | Admitting: Family Medicine

## 2024-07-07 ENCOUNTER — Encounter: Payer: Self-pay | Admitting: Nurse Practitioner

## 2024-07-07 ENCOUNTER — Ambulatory Visit (INDEPENDENT_AMBULATORY_CARE_PROVIDER_SITE_OTHER): Admitting: Nurse Practitioner

## 2024-07-07 VITALS — BP 124/76 | HR 72

## 2024-07-07 DIAGNOSIS — M8589 Other specified disorders of bone density and structure, multiple sites: Secondary | ICD-10-CM

## 2024-07-07 DIAGNOSIS — Z780 Asymptomatic menopausal state: Secondary | ICD-10-CM | POA: Diagnosis not present

## 2024-07-07 DIAGNOSIS — N3001 Acute cystitis with hematuria: Secondary | ICD-10-CM

## 2024-07-07 DIAGNOSIS — B3731 Acute candidiasis of vulva and vagina: Secondary | ICD-10-CM | POA: Diagnosis not present

## 2024-07-07 DIAGNOSIS — R102 Pelvic and perineal pain unspecified side: Secondary | ICD-10-CM | POA: Diagnosis not present

## 2024-07-07 DIAGNOSIS — Z01419 Encounter for gynecological examination (general) (routine) without abnormal findings: Secondary | ICD-10-CM

## 2024-07-07 DIAGNOSIS — N209 Urinary calculus, unspecified: Secondary | ICD-10-CM | POA: Diagnosis not present

## 2024-07-07 MED ORDER — FLUCONAZOLE 150 MG PO TABS: 150.0000 mg | ORAL_TABLET | ORAL | 0 refills | Status: DC

## 2024-07-07 MED ORDER — SULFAMETHOXAZOLE-TRIMETHOPRIM 800-160 MG PO TABS: 1.0000 | ORAL_TABLET | Freq: Two times a day (BID) | ORAL | 0 refills | Status: AC

## 2024-07-07 NOTE — Progress Notes (Signed)
 [name] [facility] [birth year] [id number]   History:  68 y.o. G3P3 presents for annual exam. Postmenopausal - no HRT, no bleeding. Normal pap history. H/O rectal cancer in 2017 with bowel reconstructive surgery, also had resection in 2008, hernia repair 2009. 2017 BSO. Complains of dark urine and pelvic pain. H/O kidney stones.   Gynecologic History No LMP recorded. Patient has had an ablation.   Contraception/Family planning: post menopausal status Sexually active: Yes  Health Maintenance Last Pap: 12/15/2022. Results were: Normal neg HPV Last mammogram: 07/15/2023. Results were: Small complicated cyst cluster, benign calcifications Last colonoscopy: 01/29/2023 Last Dexa: 12/29/2022. Results were: T-score -2.3, FRAX 11% / 2.1%  Past medical history, past surgical history, family history and social history were all reviewed and documented in the EPIC chart. Married. Part time dog groomer. 3 children, all live local. 6 grandchildren ages 80-10.   ROS:  A ROS was performed and pertinent positives and negatives are included.  Exam:  Vitals:   07/07/24 1017  BP: 124/76  Pulse: 72  SpO2: 98%    There is no height or weight on file to calculate BMI.  General appearance:  Normal Thyroid:  Symmetrical, normal in size, without palpable masses or nodularity. Respiratory  Auscultation:  Clear without wheezing or rhonchi Cardiovascular  Auscultation:  Regular rate, without rubs, murmurs or gallops  Edema/varicosities:  Not grossly evident Abdominal  Soft,nontender, without masses, guarding or rebound.  Liver/spleen:  No organomegaly noted  Hernia:  None appreciated  Skin  Inspection:  Grossly normal Breasts: Examined lying and sitting.   Right: Without masses, retractions, nipple discharge or axillary adenopathy.   Left: Without masses, retractions, nipple discharge or axillary adenopathy. Pelvic: External genitalia:  no lesions              Urethra:  normal appearing urethra  with no masses, tenderness or lesions              Bartholins and Skenes: normal                 Vagina: normal appearing vagina with normal color and discharge, no lesions              Cervix: no lesions Bimanual Exam:  Uterus:  no masses or tenderness              Adnexa: no mass, fullness, tenderness              Rectovaginal: Deferred              Anus:  normal, no lesions  Clotilda Pa, [name] present as chaperone.   UA: 1+ leukocytes, nitrite neg, 2+ blood, amber/cloudy. Microscopic: wbc 20-40, rbc 20-40, few calcium oxalate, yeast/budding present  Assessment/Plan:  68 y.o. G3P3 for annual exam.   Well female exam with routine gynecological exam - Education provided on SBEs, importance of preventative screenings, current guidelines, high calcium diet, regular exercise, and multivitamin daily.  Labs with PCP.   Osteopenia of multiple sites - Plan: DG Bone Density. 11/2022 T-score -2.3 without elevated FRAX. Repeat next April.   Pelvic pain - Plan: Urinalysis,Complete w/RFL Culture. Few Calcium oxalate present. Discussed possible kidney stone/s and when to be seen.   Vaginal candidiasis - Plan: fluconazole  (DIFLUCAN ) 150 MG tablet every 3 days x 2 doses.   Acute cystitis with hematuria - Plan: sulfamethoxazole -trimethoprim  (BACTRIM  DS) 800-160 MG tablet BID x 3 days. Culture pending.  Screening for cervical cancer - Plan: Normal pap history. No longer screening  per guidelines.   Postmenopausal - no HRT, no bleeding  Screening for breast cancer - Continue annual screenings.  Normal breast exam today.  Screening for colon cancer - 12/2022 colonoscopy. H/O rectal cancer.    Return in about 1 year (around 07/07/2025) for Annual.     [name] Shutter DNP, 10:52 AM 07/07/2024

## 2024-07-09 LAB — URINALYSIS, COMPLETE W/RFL CULTURE
Glucose, UA: NEGATIVE
Hyaline Cast: NONE SEEN /LPF
Nitrites, Initial: NEGATIVE
Specific Gravity, Urine: 1.025 (ref 1.001–1.035)
pH: 5 (ref 5.0–8.0)

## 2024-07-09 LAB — URINE CULTURE
MICRO NUMBER:: 17206490
Result:: NO GROWTH
SPECIMEN QUALITY:: ADEQUATE

## 2024-07-09 LAB — CULTURE INDICATED

## 2024-07-10 ENCOUNTER — Ambulatory Visit: Payer: Self-pay | Admitting: Nurse Practitioner

## 2024-07-11 ENCOUNTER — Ambulatory Visit (HOSPITAL_BASED_OUTPATIENT_CLINIC_OR_DEPARTMENT_OTHER)
Admission: RE | Admit: 2024-07-11 | Discharge: 2024-07-11 | Disposition: A | Discharge status: Home / Self Care | Source: Ambulatory Visit | Attending: Family Medicine | Admitting: Family Medicine

## 2024-07-11 ENCOUNTER — Encounter: Payer: Self-pay | Admitting: Family Medicine

## 2024-07-11 ENCOUNTER — Ambulatory Visit: Admitting: Family Medicine

## 2024-07-11 ENCOUNTER — Ambulatory Visit: Payer: Self-pay | Admitting: Family Medicine

## 2024-07-11 VITALS — BP 125/77 | HR 75 | Temp 97.4°F | Ht 63.0 in | Wt 178.0 lb

## 2024-07-11 DIAGNOSIS — R10A2 Flank pain, left side: Secondary | ICD-10-CM | POA: Diagnosis not present

## 2024-07-11 DIAGNOSIS — R109 Unspecified abdominal pain: Secondary | ICD-10-CM | POA: Diagnosis not present

## 2024-07-11 DIAGNOSIS — N200 Calculus of kidney: Secondary | ICD-10-CM

## 2024-07-11 DIAGNOSIS — R051 Acute cough: Secondary | ICD-10-CM

## 2024-07-11 DIAGNOSIS — M48061 Spinal stenosis, lumbar region without neurogenic claudication: Secondary | ICD-10-CM | POA: Diagnosis not present

## 2024-07-11 DIAGNOSIS — N83201 Unspecified ovarian cyst, right side: Secondary | ICD-10-CM | POA: Diagnosis not present

## 2024-07-11 DIAGNOSIS — R7989 Other specified abnormal findings of blood chemistry: Secondary | ICD-10-CM

## 2024-07-11 DIAGNOSIS — K439 Ventral hernia without obstruction or gangrene: Secondary | ICD-10-CM | POA: Diagnosis not present

## 2024-07-11 DIAGNOSIS — K611 Rectal abscess: Secondary | ICD-10-CM | POA: Diagnosis not present

## 2024-07-11 DIAGNOSIS — R251 Tremor, unspecified: Secondary | ICD-10-CM

## 2024-07-11 DIAGNOSIS — I700 Atherosclerosis of aorta: Secondary | ICD-10-CM

## 2024-07-11 LAB — CBC WITH DIFFERENTIAL/PLATELET
Basophils Absolute: 0 K/uL (ref 0.0–0.1)
Basophils Relative: 0.3 % (ref 0.0–3.0)
Eosinophils Absolute: 0 K/uL (ref 0.0–0.7)
Eosinophils Relative: 0.1 % (ref 0.0–5.0)
HCT: 39.3 % (ref 36.0–46.0)
Hemoglobin: 13.1 g/dL (ref 12.0–15.0)
Lymphocytes Relative: 27.8 % (ref 12.0–46.0)
Lymphs Abs: 1.6 K/uL (ref 0.7–4.0)
MCHC: 33.3 g/dL (ref 30.0–36.0)
MCV: 88.2 fl (ref 78.0–100.0)
Monocytes Absolute: 1.2 K/uL — ABNORMAL HIGH (ref 0.1–1.0)
Monocytes Relative: 19.5 % — ABNORMAL HIGH (ref 3.0–12.0)
Neutro Abs: 3.1 K/uL (ref 1.4–7.7)
Neutrophils Relative %: 52.3 % (ref 43.0–77.0)
Platelets: 216 K/uL (ref 150.0–400.0)
RBC: 4.45 Mil/uL (ref 3.87–5.11)
RDW: 13.8 % (ref 11.5–15.5)
WBC: 5.9 K/uL (ref 4.0–10.5)

## 2024-07-11 LAB — BASIC METABOLIC PANEL WITH GFR
BUN: 14 mg/dL (ref 6–23)
CO2: 24 meq/L (ref 19–32)
Calcium: 9 mg/dL (ref 8.4–10.5)
Chloride: 102 meq/L (ref 96–112)
Creatinine, Ser: 1.05 mg/dL (ref 0.40–1.20)
GFR: 54.82 mL/min — ABNORMAL LOW (ref >60.00)
Glucose, Bld: 80 mg/dL (ref 70–99)
Potassium: 4.5 meq/L (ref 3.5–5.1)
Sodium: 138 meq/L (ref 135–145)

## 2024-07-11 MED ORDER — BENZONATATE 200 MG PO CAPS: 200.0000 mg | ORAL_CAPSULE | Freq: Two times a day (BID) | ORAL | 0 refills | Status: DC | PRN

## 2024-07-11 MED ORDER — METHOCARBAMOL 500 MG PO TABS: 500.0000 mg | ORAL_TABLET | Freq: Three times a day (TID) | ORAL | 0 refills | Status: DC | PRN
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
## 2024-07-12 ENCOUNTER — Ambulatory Visit (HOSPITAL_BASED_OUTPATIENT_CLINIC_OR_DEPARTMENT_OTHER)
Admission: RE | Admit: 2024-07-12 | Discharge: 2024-07-12 | Disposition: A | Discharge status: Home / Self Care | Source: Ambulatory Visit | Attending: Urology | Admitting: Urology

## 2024-07-12 ENCOUNTER — Other Ambulatory Visit: Payer: Self-pay | Admitting: Nurse Practitioner

## 2024-07-12 ENCOUNTER — Ambulatory Visit (HOSPITAL_BASED_OUTPATIENT_CLINIC_OR_DEPARTMENT_OTHER)
Admission: RE | Admit: 2024-07-12 | Discharge: 2024-07-12 | Disposition: A | Discharge status: Home / Self Care | Source: Ambulatory Visit | Attending: Nurse Practitioner | Admitting: Nurse Practitioner

## 2024-07-12 ENCOUNTER — Encounter: Payer: Self-pay | Admitting: Urology

## 2024-07-12 ENCOUNTER — Ambulatory Visit: Admitting: Urology

## 2024-07-12 VITALS — BP 108/74 | HR 72 | Ht 62.0 in | Wt 174.0 lb

## 2024-07-12 DIAGNOSIS — N200 Calculus of kidney: Secondary | ICD-10-CM | POA: Insufficient documentation

## 2024-07-12 DIAGNOSIS — M8589 Other specified disorders of bone density and structure, multiple sites: Secondary | ICD-10-CM | POA: Insufficient documentation

## 2024-07-12 DIAGNOSIS — K5900 Constipation, unspecified: Secondary | ICD-10-CM | POA: Diagnosis not present

## 2024-07-12 DIAGNOSIS — R1900 Intra-abdominal and pelvic swelling, mass and lump, unspecified site: Secondary | ICD-10-CM

## 2024-07-12 DIAGNOSIS — I878 Other specified disorders of veins: Secondary | ICD-10-CM | POA: Diagnosis not present

## 2024-07-12 LAB — URINALYSIS, ROUTINE W REFLEX MICROSCOPIC
Bilirubin, UA: NEGATIVE
Glucose, UA: NEGATIVE
Ketones, UA: NEGATIVE
Leukocytes,UA: NEGATIVE
Nitrite, UA: NEGATIVE
Protein,UA: NEGATIVE
Specific Gravity, UA: 1.015 (ref 1.005–1.030)
Urobilinogen, Ur: 0.2 mg/dL (ref 0.2–1.0)
pH, UA: 5.5 (ref 5.0–7.5)

## 2024-07-12 LAB — MICROSCOPIC EXAMINATION

## 2024-07-12 MED ORDER — ROSUVASTATIN CALCIUM 20 MG PO TABS: 20.0000 mg | ORAL_TABLET | Freq: Every day | ORAL | 1 refills | Status: DC

## 2024-07-12 MED ORDER — TRAMADOL HCL 50 MG PO TABS: 50.0000 mg | ORAL_TABLET | Freq: Three times a day (TID) | ORAL | 0 refills | Status: DC | PRN
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
## 2024-07-12 NOTE — Telephone Encounter (Signed)
 OK to schedule US . I have placed orders.

## 2024-07-12 NOTE — Addendum Note (Signed)
 Addended by: ALMARIE BIRMINGHAM B on: 07/12/2024 09:13 AM   Modules accepted: Orders

## 2024-07-13 ENCOUNTER — Other Ambulatory Visit

## 2024-07-13 ENCOUNTER — Ambulatory Visit

## 2024-07-13 ENCOUNTER — Other Ambulatory Visit: Payer: Self-pay | Admitting: Nurse Practitioner

## 2024-07-13 DIAGNOSIS — R1900 Intra-abdominal and pelvic swelling, mass and lump, unspecified site: Secondary | ICD-10-CM

## 2024-07-13 DIAGNOSIS — N83201 Unspecified ovarian cyst, right side: Secondary | ICD-10-CM

## 2024-07-13 NOTE — Telephone Encounter (Signed)
 Waddell - I was going to let them know to schedule a visit to discuss since she had just come in for an urgent visit, but also don't see where she has been diagnosed with Parkinsons?

## 2024-07-13 NOTE — Addendum Note (Signed)
 Addended by: Donnarae Rae L on: 07/13/2024 03:01 PM   Modules accepted: Orders

## 2024-07-14 ENCOUNTER — Ambulatory Visit: Payer: Self-pay | Admitting: Urology

## 2024-07-14 NOTE — Addendum Note (Signed)
 Addended by: ALMARIE BIRMINGHAM B on: 07/14/2024 08:18 AM   Modules accepted: Orders

## 2024-07-18 ENCOUNTER — Encounter: Payer: Self-pay | Admitting: Nurse Practitioner

## 2024-07-18 ENCOUNTER — Ambulatory Visit: Payer: Self-pay | Admitting: Nurse Practitioner

## 2024-07-19 NOTE — Telephone Encounter (Signed)
 Thank you for the updates, Kate and getting her scheduled with Dr. Glennon. I discussed her case with Dr. Glennon yesterday and she agrees with consultation.

## 2024-07-19 NOTE — Telephone Encounter (Signed)
 Spoke with patients daughter, Brittany, ok per dpr.   OV with TW cancelled.   OV scheduled for 08/10/24 with Dr. Glennon at 1200. Daughter reports patient has been experiencing some memory loss/changes, someone will accompany her to this visit.   Also added to waitlist.   Routing to provider for final review. Patient is agreeable to disposition. Will close encounter.  Cc: Dr. Glennon

## 2024-07-20 ENCOUNTER — Encounter: Payer: Self-pay | Admitting: Neurology

## 2024-07-24 ENCOUNTER — Ambulatory Visit: Admitting: Nurse Practitioner

## 2024-07-24 ENCOUNTER — Encounter: Payer: Self-pay | Admitting: Family Medicine

## 2024-07-24 DIAGNOSIS — K6289 Other specified diseases of anus and rectum: Secondary | ICD-10-CM

## 2024-07-24 DIAGNOSIS — Z85038 Personal history of other malignant neoplasm of large intestine: Secondary | ICD-10-CM

## 2024-07-24 DIAGNOSIS — R109 Unspecified abdominal pain: Secondary | ICD-10-CM

## 2024-07-25 NOTE — Telephone Encounter (Signed)
 Referral pended

## 2024-08-10 ENCOUNTER — Encounter: Payer: Self-pay | Admitting: Obstetrics and Gynecology

## 2024-08-10 ENCOUNTER — Ambulatory Visit (INDEPENDENT_AMBULATORY_CARE_PROVIDER_SITE_OTHER): Admitting: Obstetrics and Gynecology

## 2024-08-10 VITALS — BP 120/78

## 2024-08-10 DIAGNOSIS — Z85038 Personal history of other malignant neoplasm of large intestine: Secondary | ICD-10-CM

## 2024-08-10 DIAGNOSIS — N83201 Unspecified ovarian cyst, right side: Secondary | ICD-10-CM

## 2024-08-10 MED ORDER — LIDOCAINE 5 % EX PTCH: 1.0000 | MEDICATED_PATCH | CUTANEOUS | 0 refills | Status: AC
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
## 2024-08-11 ENCOUNTER — Telehealth: Payer: Self-pay | Admitting: *Deleted

## 2024-08-11 NOTE — Telephone Encounter (Signed)
 Spoke with the patient's daughter Brittany regarding the referral to GYN oncology. Patient scheduled as new patient with *Dr Viktoria on 09/07/24 at 10:30 am. Patient given an arrival time of 10am.  Explained to the patient the the doctor will perform a pelvic exam at this visit. Patient given the policy that only one visitor allowed and that visitor must be over 16 yrs are allowed in the Cancer Center. Patient given the address/phone number for the clinic and that the center offers free valet service. Patient aware that masks optional. Patient;s daughter offered earlier appt but declined

## 2024-08-12 LAB — CEA: CEA: <2 ng/mL

## 2024-08-14 ENCOUNTER — Ambulatory Visit: Payer: Self-pay | Admitting: Obstetrics and Gynecology

## 2024-08-20 LAB — OVARIAN MALIGNANCY RISK-ROMA
CA125: 8 U/mL
HE4, OVARIAN CANCER MONITORING: 52 pmol/L
ROMA Postmenopausal: 0.78
ROMA Premenopausal: 0.78

## 2024-09-04 NOTE — Telephone Encounter (Signed)
 Patient is scheduled to see Dr. Viktoria on 09/07/24.   Routing to Dr. Glennon to review and advise.

## 2024-09-04 NOTE — Telephone Encounter (Signed)
 Patients daughter, Laymon, left message on triage line inquiring as seen below per MyChart message. Spoke with Brittany, advised to keep appt with Dr. Viktoria for evaluation, can further discuss treatment options with Dr. Viktoria. Evaluation will determine who would proceed with hysterectomy, Dr. Viktoria or Dr. Glennon,  if appropriate. Questions answered. Brittany will call if any additional questions.   Routing to provider for final review. Will close encounter.

## 2024-09-05 ENCOUNTER — Encounter: Payer: Self-pay | Admitting: Gynecologic Oncology
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
## 2024-09-07 ENCOUNTER — Inpatient Hospital Stay: Attending: Gynecologic Oncology | Admitting: Gynecologic Oncology

## 2024-09-07 ENCOUNTER — Encounter: Payer: Self-pay | Admitting: Gynecologic Oncology

## 2024-09-07 VITALS — BP 134/82 | HR 67 | Temp 97.5°F | Resp 19 | Ht 63.0 in | Wt 178.0 lb

## 2024-09-07 DIAGNOSIS — G8929 Other chronic pain: Secondary | ICD-10-CM | POA: Diagnosis not present

## 2024-09-07 DIAGNOSIS — N9489 Other specified conditions associated with female genital organs and menstrual cycle: Secondary | ICD-10-CM

## 2024-09-07 DIAGNOSIS — K432 Incisional hernia without obstruction or gangrene: Secondary | ICD-10-CM | POA: Insufficient documentation

## 2024-09-07 DIAGNOSIS — Z800 Family history of malignant neoplasm of digestive organs: Secondary | ICD-10-CM | POA: Diagnosis not present

## 2024-09-07 DIAGNOSIS — R2242 Localized swelling, mass and lump, left lower limb: Secondary | ICD-10-CM | POA: Insufficient documentation

## 2024-09-07 DIAGNOSIS — Z8041 Family history of malignant neoplasm of ovary: Secondary | ICD-10-CM | POA: Insufficient documentation

## 2024-09-07 DIAGNOSIS — Z803 Family history of malignant neoplasm of breast: Secondary | ICD-10-CM | POA: Insufficient documentation

## 2024-09-07 DIAGNOSIS — Z87442 Personal history of urinary calculi: Secondary | ICD-10-CM | POA: Diagnosis not present

## 2024-09-07 DIAGNOSIS — D398 Neoplasm of uncertain behavior of other specified female genital organs: Secondary | ICD-10-CM | POA: Insufficient documentation

## 2024-09-07 DIAGNOSIS — G20A1 Parkinson's disease without dyskinesia, without mention of fluctuations: Secondary | ICD-10-CM | POA: Insufficient documentation

## 2024-09-07 DIAGNOSIS — F40240 Claustrophobia: Secondary | ICD-10-CM | POA: Diagnosis not present

## 2024-09-07 DIAGNOSIS — K611 Rectal abscess: Secondary | ICD-10-CM

## 2024-09-07 DIAGNOSIS — Z85038 Personal history of other malignant neoplasm of large intestine: Secondary | ICD-10-CM | POA: Insufficient documentation

## 2024-09-07 MED ORDER — LORAZEPAM 0.5 MG PO TABS: 0.5000 mg | ORAL_TABLET | Freq: Once | ORAL | 0 refills | Status: AC

## 2024-09-07 NOTE — Patient Instructions (Signed)
 It was very nice to meet you both today.  I have reached out to our genetic counselors to see if there is any benefit to retesting your genetic testing that was done in 2017.  This showed no known genetic mutation that would increase your risk of developing certain cancers.  The office has scheduled your MRI.  This will give us  better views of the ovary on that right side and help hopefully confirm that there are no imaging characteristics or findings that would raise more significant concern for a precancer or cancer of the ovary.  We will have a phone visit after your MRI to discuss follow-up and any more strong indications for surgery.  As we discussed today, given your surgical history and the infection after your last colon surgery, undertaking surgery to remove the right ovary would come with some risk.  If we were going back in for surgery, I would recommend doing this in combination with a hernia specialist.

## 2024-09-14 NOTE — Progress Notes (Signed)
 "  Assessment/Plan:   Assessment and Plan Assessment & Plan Tremor predominant Parkinson's disease She met clinical criteria for tremor predominant Parkinson's disease with slow progression and mild cognitive changes.  Tremor previously diagnosed with essential tremor.  I do not see any evidence of that.  Previously on propranolol  with side effects. - Initiated carbidopa /levodopa  with slow titration: half tablet three times daily for one week, then gradual increase to one tablet three times daily over one month.  Titration schedule given to get her up to carbidopa /levodopa  25/100, 1 tablet at 7 AM/11 AM/4 PM - Instructed her to take carbidopa /levodopa  on an empty stomach or with carbohydrates, avoiding protein within 30 minutes of dosing. - Recommended use of a pill organizer and medication reminders (e.g., Alexa) to improve adherence. - Referred to Parkinson's-specific physical therapy at [facility] site in [place] to facilitate transition to regular exercise. - Emphasized regular cardiovascular exercise (e.g., stationary bike) as the only intervention shown to slow disease progression. - Provided information on the [employer] PD Generation genetic study and instructions for self-enrollment. - Recommended annual dermatology skin examinations due to increased melanoma risk associated with Parkinson's disease. - Advised installation of tuck-in bed rails to reduce risk of injury from REM sleep behavior disorder. - Encouraged increased hydration (at least two 30-ounce Hydroflasks per day) to address dizziness and support overall health. - Provided educational materials on exercise groups, support groups, and Parkinson's disease resources. - Discussed follow-up options, including continued care at this clinic or with her previous neurologist, emphasizing the importance of regular follow-up.  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  We discussed that this can be very serious and even harmful.  We talked about medications as well as physical barriers to put in the bed (particularly soft bed rails, pillow barriers).  We talked about moving the night stand so that it is not so close to the side of the bed.    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  Patient had stricture at anastomosis site in 2024.  This was dilated.  Patient developed infectious colitis and also had perirectal abscess in 2024. - Patient following with gastroenterology.  Memory change -Likely multifactorial.  I do think that new onset anxiety is playing a role here and encouraged her to follow back up with primary care. -husband working nights which is disruptive to her sleep and this also likely plays a role. - Do not think that she has neurodegenerative dementia at this point in time  Gave her the option to follow here or follow-up with prior neurologist, Dr. Lavetta Bull, and at this point in time they opted to follow here.  Will see her back in 6 months, sooner should new neurologic issues arise.  Subjective:   Discussed the use of AI scribe software for clinical note transcription with the patient, who gave verbal consent to proceed.  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  The patient is with her sister and daughter who supplement the history.  Tremor onset was in August 2024, initially affecting the right hand and subsequently involving the left hand (less severe), right leg, and more recently, the head. Tremor is present at rest and with action, including during eating and fine motor tasks such as  tying shoes or threading. Severity increases with anxiety, stress, or fatigue, and is  unaffected by caffeine or alcohol. Propranolol  10 mg three times daily was previously trialed but discontinued due to dizziness and hypotension. Tremor interferes with daily activities, requiring conscious effort for tasks that were previously automatic. She is able to dress and button clothing but sometimes needs to pause and refocus. Handwriting has become progressively smaller. She is no longer able to drive long distances due to anxiety and dizziness, and has increasing difficulty with short drives. She experiences dizziness but denies syncope or diplopia. She has not undergone DAT scan, skin biopsy, or MRI due to anxiety and claustrophobia. Previous neurology care was with Dr. Damien Bull at [facility].  Gait and balance have changed, with shorter stride length and slower walking speed. She is more cautious due to fear of falling, though she has not had spontaneous falls; a fall last year was attributed to being pulled by a dog. She uses a support bar at home for stability on steps and is able to rise from low chairs and cars independently. No shuffling gait reported.  Cognitive symptoms include memory impairment, such as forgetting to complete tasks and occasional uncertainty about medication administration. She manages household finances but anticipates needing assistance in the future and sometimes requires help from her daughter. She experiences slow processing and difficulty with tasks that were previously automatic. She reports increased anxiety, which is a change from baseline. Sleep is disrupted by her husband's work schedule. She has vivid dreams and acts out dreams at night, including shouting and physical movements, sometimes striking her husband. She has not fallen out of bed but has come close. She drools at night and sometimes during periods of concentration. No choking or dysphagia reported.  Olfactory and gustatory function are diminished, with minimal sense of smell and taste. Appetite is  reduced, and she eats small meals primarily for social reasons. She experiences urinary urgency and wears absorbent undergarments, primarily for bowel incontinence. 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 She is followed by GI and has an upcoming appointment. Current medications include Pepcid , extra strength Tylenol , an unspecified gut medication, and two types of over-the-counter herbs. She uses Pepcid  to tolerate Tylenol .  She reports occasional visual distortions, such as seeing little bunnies or kitties in her peripheral vision, which began after a COVID infection. She sometimes perceives people or animals in her peripheral vision that disappear when she looks directly. She denies true hallucinations, diplopia, or syncope.  Family history is notable for Parkinson's disease in her father and paternal aunt, and dysphonia in her sister. She lives in [place] with her husband, who is in good health and works full time. She previously worked as a research scientist (medical).    Prior history: Patient previously seen by Dr. Damien Bull.  Notes are reviewed.  Patient first saw Dr. Bull August, 2024 and patient was referred for tremor.  Dr. Bull noted that patient had some features of Parkinson's disease but also essential tremor.  She started the patient on propranolol  10 mg 3 times per day.  Patient followed up 3 months later and was somewhat improved and they decided to continue the propranolol .  Patient was last seen August, 2025.  At that point in time, Dr. Bull continued to note both features of essential tremor and Parkinson's disease.  Patient had discontinued her propranolol  because of dizziness.  They discussed DaTscan and skin  biopsy for alpha-synuclein.  Dr. Jenel did not want to start levodopa  because patient had significant chronic diarrhea.  Dr. Jenel noted that patient wanted to just monitor symptoms and forego  further testing or medication trial.  Prior meds: Propranolol  (dizzy)  ALLERGIES:  Allergies[1]  CURRENT MEDICATIONS:  Current Outpatient Medications  Medication Instructions   Acetaminophen  (TYLENOL  PO) Take by mouth.   APAP-Mag Salicylate-Caffeine (BACK PAIN-OFF PO) Take by mouth.   budesonide (ENTOCORT EC) 3 mg, Daily   famotidine -calcium  carbonate-magnesium  hydroxide (PEPCID  COMPLETE) [phone] MG chewable tablet 1 tablet, Daily   lidocaine  (LIDODERM ) 5 % 1 patch, Transdermal, Every 24 hours, Remove & Discard patch within 12 hours or as directed by MD    Objective:   PHYSICAL EXAMINATION:    VITALS:   Vitals:   09/18/24 1045  BP: (!) 171/99  Pulse: 73  SpO2: 99%  Weight: 180 lb (81.6 kg)  Height: 5' 6 (1.676 m)    GEN:  The patient appears stated age and is in NAD. HEENT:  Normocephalic, atraumatic.  The mucous membranes are moist. The superficial temporal arteries are without ropiness or tenderness. CV:  RRR Lungs:  CTAB Neck/HEME:  There are no carotid bruits bilaterally.  Neurological examination:  Orientation: The patient is alert and oriented x3.  Cranial nerves: There is good facial symmetry.  Extraocular muscles are intact. The visual fields are full to confrontational testing. The speech is fluent and clear. Soft palate rises symmetrically and there is no tongue deviation. Hearing is intact to conversational tone. Sensation: Sensation is intact to light touch throughout (facial, trunk, extremities). Vibration is intact at the bilateral big toe. There is no extinction with double simultaneous stimulation.  Motor: Strength is 5/5 in the bilateral upper and lower extremities.   Shoulder shrug is equal and symmetric.  There is no pronator drift. Deep tendon reflexes: Deep tendon reflexes are 2/4 at the bilateral biceps, triceps, brachioradialis, patella and achilles. Plantar responses are downgoing bilaterally.  Movement examination: Tone: There is nl tone in  the bilateral upper extremities.  The tone in the lower extremities is nl.  Abnormal movements: there is RUE rest tremor that increases with distraction and rare LUE resting tremor; there is rare tremor of the R leg Coordination:  There is mild decremation with RAM's, with finger taps on the right and toe taps on the L Gait and Station: The patient has minima difficulty arising out of a deep-seated chair without the use of the hands. The patient's stride length is good but she has re-emergent tremor of the RUE and decreased arm swing bilaterally but it is present.      I have reviewed and interpreted the following labs independently   Chemistry      Component Value Date/Time   NA 138 07/11/2024 1118   NA 141 11/18/2016 0820   K 4.5 07/11/2024 1118   K 4.2 11/18/2016 0820   CL 102 07/11/2024 1118   CO2 24 07/11/2024 1118   CO2 26 11/18/2016 0820   BUN 14 07/11/2024 1118   BUN 18.6 11/18/2016 0820   CREATININE 1.05 07/11/2024 1118   CREATININE 0.69 10/08/2023 1023   CREATININE 0.8 11/18/2016 0820      Component Value Date/Time   CALCIUM  9.0 07/11/2024 1118   CALCIUM  9.6 11/18/2016 0820   ALKPHOS 67 08/18/2023 0554   ALKPHOS 60 11/18/2016 0820   AST 17 10/08/2023 1023   AST 12 11/18/2016 0820   ALT 11 10/08/2023 1023   ALT 10  11/18/2016 0820   BILITOT 0.5 10/08/2023 1023   BILITOT 0.78 11/18/2016 0820      Lab Results  Component Value Date   TSH 1.13 11/16/2022   Lab Results  Component Value Date   WBC 5.9 07/11/2024   HGB 13.1 07/11/2024   HCT 39.3 07/11/2024   MCV 88.2 07/11/2024   PLT 216.0 07/11/2024      Total time spent on today's visit was 96 minutes, including both face-to-face time and nonface-to-face time.  Time included that spent on review of records (prior notes available to me/labs/imaging if pertinent), discussing treatment and goals, answering patient's questions and coordinating care.  Cc:  Almarie Waddell NOVAK, NP      [1]  Allergies Allergen  Reactions   Nitrofurantoin Monohyd Macro Anaphylaxis    Throat swelling    Iodinated Contrast Media Hives    Oral CT contrast-chalky on 06-01-16  Face turned red, hot, dizzy  Other Reaction(s): facial swelling, redness   Gabapentin  Other (See Comments)    gabapentin    Morphine  Other (See Comments)    Hallucinations   Pregabalin Other (See Comments)    Lyrica   Metronidazole  Nausea Only   "

## 2024-09-18 ENCOUNTER — Ambulatory Visit: Admitting: Neurology

## 2024-09-18 ENCOUNTER — Encounter: Payer: Self-pay | Admitting: Neurology

## 2024-09-18 VITALS — BP 171/99 | HR 73 | Ht 66.0 in | Wt 180.0 lb

## 2024-09-18 DIAGNOSIS — R413 Other amnesia: Secondary | ICD-10-CM | POA: Diagnosis not present

## 2024-09-18 DIAGNOSIS — G20A1 Parkinson's disease without dyskinesia, without mention of fluctuations: Secondary | ICD-10-CM | POA: Diagnosis not present

## 2024-09-18 DIAGNOSIS — K529 Noninfective gastroenteritis and colitis, unspecified: Secondary | ICD-10-CM

## 2024-09-18 DIAGNOSIS — G4752 REM sleep behavior disorder: Secondary | ICD-10-CM

## 2024-09-18 NOTE — Patient Instructions (Addendum)
 Good to see you today!  Heres a visit summary.  I will see you back in 6 months.  Please arrive at least 15 min prior to your next visit on the day of your visit.  VISIT SUMMARY: During your visit, we evaluated your tremor and associated neurological symptoms. Based on your symptoms and clinical criteria, you have been diagnosed with tremor predominant Parkinson's disease. We discussed a comprehensive management plan to address your condition and improve your quality of life.  YOUR PLAN: -TREMOR PREDOMINANT PARKINSON'S DISEASE: Tremor predominant Parkinson's disease is a type of Parkinson's disease where tremor is the main symptom. It progresses slowly and can include mild cognitive changes. We have started you on carbidopa /levodopa  with a slow increase in dosage: take half a tablet three times daily for one week, then gradually increase to one tablet three times daily over one month. Take this medication on an empty stomach or with carbohydrates, avoiding protein within 30 minutes of dosing. Use a pill organizer and medication reminders to help you take your medication on time. We have referred you to Parkinson's-specific physical therapy to help you transition to regular exercise, which is important for slowing disease progression. Regular cardiovascular exercise, such as using a stationary bike, is recommended. We also provided information on the [employer] PD Generation genetic study for self-enrollment and advised annual dermatology skin examinations due to increased melanoma risk. To reduce the risk of injury from REM sleep behavior disorder, consider installing tuck-in bed rails. Increase your hydration by drinking at least two 30-ounce Hydroflasks of water  per day. We provided educational materials on exercise groups, support groups, and Parkinson's disease resources. Regular follow-up is important, and you can continue care at this clinic or with your previous  neurologist.  INSTRUCTIONS: Please follow the medication instructions for carbidopa /levodopa  carefully and use a pill organizer and reminders to help with adherence. Attend the Parkinson's-specific physical therapy sessions at the [facility] site in [place]. Engage in regular cardiovascular exercise, such as using a stationary bike. Enroll in the [employer] PD Generation genetic study if interested. Schedule annual dermatology skin examinations. Consider installing tuck-in bed rails to prevent injury from REM sleep behavior disorder. Increase your water  intake to at least two 30-ounce Hydroflasks per day. Review the educational materials provided on exercise groups, support groups, and Parkinson's disease resources. Ensure regular follow-up appointments, either at this clinic or with your previous neurologist.  Start Carbidopa  Levodopa  as follows: Take 1/2 tablet three times daily, at least 30 minutes before meals (approximately 7am/11am/4pm), for one week Then take 1/2 tablet in the morning, 1/2 tablet in the afternoon, 1 tablet in the evening, at least 30 minutes before meals, for one week Then take 1/2 tablet in the morning, 1 tablet in the afternoon, 1 tablet in the evening, at least 30 minutes before meals, for one week Then take 1 tablet three times daily at 7am/11am/4pm, at least 30 minutes before meals   As a reminder, carbidopa /levodopa  can be taken at the same time as a carbohydrate, but we like to have you take your pill either 30 minutes before a protein source or 1 hour after as protein can interfere with carbidopa /levodopa  absorption.     Contains text generated by [employer].

## 2024-09-19 ENCOUNTER — Other Ambulatory Visit: Payer: Self-pay

## 2024-09-19 DIAGNOSIS — G20A1 Parkinson's disease without dyskinesia, without mention of fluctuations: Secondary | ICD-10-CM

## 2024-09-19 MED ORDER — CARBIDOPA-LEVODOPA 25-100 MG PO TABS: ORAL_TABLET | ORAL | 0 refills | Status: AC

## 2024-09-21 ENCOUNTER — Ambulatory Visit
Admission: RE | Admit: 2024-09-21 | Discharge: 2024-09-21 | Disposition: A | Discharge status: Home / Self Care | Source: Ambulatory Visit | Attending: Gynecologic Oncology | Admitting: Gynecologic Oncology

## 2024-09-21 DIAGNOSIS — N9489 Other specified conditions associated with female genital organs and menstrual cycle: Secondary | ICD-10-CM

## 2024-09-21 MED ORDER — GADOPICLENOL 0.5 MMOL/ML IV SOLN
8.0000 mL | Freq: Once | INTRAVENOUS | Status: AC | PRN
  Administered 2024-09-21: 8 mL via INTRAVENOUS

## 2024-09-25 ENCOUNTER — Encounter: Payer: Self-pay | Admitting: Gynecologic Oncology

## 2024-09-27 ENCOUNTER — Inpatient Hospital Stay: Admitting: Gynecologic Oncology

## 2024-09-27 DIAGNOSIS — N9489 Other specified conditions associated with female genital organs and menstrual cycle: Secondary | ICD-10-CM | POA: Diagnosis not present

## 2024-09-27 DIAGNOSIS — K611 Rectal abscess: Secondary | ICD-10-CM

## 2024-09-28 ENCOUNTER — Telehealth: Payer: Self-pay

## 2024-09-28 ENCOUNTER — Encounter: Payer: Self-pay | Admitting: Gynecologic Oncology
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
## 2024-09-28 NOTE — Telephone Encounter (Signed)
 Per Dr.Tucker, pt is scheduled for an ultrasound on 03/14/25 @ 11:00. Pt's daughter Lum, is aware.

## 2024-09-29 ENCOUNTER — Other Ambulatory Visit: Payer: Self-pay | Admitting: Family Medicine

## 2024-09-29 DIAGNOSIS — N200 Calculus of kidney: Secondary | ICD-10-CM

## 2024-10-02 NOTE — Telephone Encounter (Signed)
Refill request for tramadol. No longer on med list.   
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
## 2024-10-06 ENCOUNTER — Encounter: Payer: Self-pay | Admitting: Family Medicine

## 2024-10-06 ENCOUNTER — Ambulatory Visit: Admitting: Family Medicine

## 2024-10-06 VITALS — BP 124/68 | HR 77 | Temp 97.5°F | Ht 66.0 in | Wt 179.6 lb

## 2024-10-06 DIAGNOSIS — F32A Depression, unspecified: Secondary | ICD-10-CM | POA: Insufficient documentation

## 2024-10-06 DIAGNOSIS — N200 Calculus of kidney: Secondary | ICD-10-CM

## 2024-10-06 DIAGNOSIS — I700 Atherosclerosis of aorta: Secondary | ICD-10-CM | POA: Insufficient documentation

## 2024-10-06 DIAGNOSIS — M5416 Radiculopathy, lumbar region: Secondary | ICD-10-CM

## 2024-10-06 LAB — COMPREHENSIVE METABOLIC PANEL WITH GFR
ALT: 5 U/L (ref 3–35)
AST: 14 U/L (ref 5–37)
Albumin: 4.2 g/dL (ref 3.5–5.2)
Alkaline Phosphatase: 72 U/L (ref 39–117)
BUN: 23 mg/dL (ref 6–23)
CO2: 29 meq/L (ref 19–32)
Calcium: 9.7 mg/dL (ref 8.4–10.5)
Chloride: 106 meq/L (ref 96–112)
Creatinine, Ser: 0.79 mg/dL (ref 0.40–1.20)
GFR: 77 mL/min
Glucose, Bld: 111 mg/dL — ABNORMAL HIGH (ref 70–99)
Potassium: 4.2 meq/L (ref 3.5–5.1)
Sodium: 141 meq/L (ref 135–145)
Total Bilirubin: 0.7 mg/dL (ref 0.2–1.2)
Total Protein: 6.9 g/dL (ref 6.0–8.3)

## 2024-10-06 LAB — LIPID PANEL
Cholesterol: 185 mg/dL (ref 28–200)
HDL: 69.5 mg/dL
LDL Cholesterol: 98 mg/dL (ref 10–99)
NonHDL: 115.97
Total CHOL/HDL Ratio: 3
Triglycerides: 92 mg/dL (ref 10.0–149.0)
VLDL: 18.4 mg/dL (ref 0.0–40.0)

## 2024-10-06 MED ORDER — SERTRALINE HCL 25 MG PO TABS: 25.0000 mg | ORAL_TABLET | Freq: Every day | ORAL | 3 refills | Status: AC

## 2024-10-06 MED ORDER — ROSUVASTATIN CALCIUM 10 MG PO TABS: 10.0000 mg | ORAL_TABLET | Freq: Every evening | ORAL | 3 refills | Status: AC

## 2024-10-06 NOTE — Assessment & Plan Note (Signed)
 No SI/HI. PHQ9/GAD7 reviewed.  Parkinson's disease with high anxiety scores. Discussed sertraline  for anxiety management. Sertraline  safe with current medications. - Initiated sertraline  (Zoloft ) 25 mg daily. - Provided information sheet on sertraline . - Follow up in 6-8 weeks to assess mood and adjust medication as needed.

## 2024-10-06 NOTE — Assessment & Plan Note (Signed)
 Aortic atherosclerosis noted on CT.  - Ordered baseline cholesterol and liver function tests. - Initiated rosuvastatin  (Crestor ). - Recheck cholesterol and liver function tests in 8-12 weeks.
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
## 2024-10-06 NOTE — Assessment & Plan Note (Signed)
 Chronic pain from lumbar spinal stenosis and nephrolithiasis managed with tramadol  as needed. - Continue tramadol  rarely for pain management. Supportive measures encouraged.  - Monitor pain levels and adjust treatment as necessary. Aware that if she needs to continue PRN tramadol  we will have to get UDS/contract on file.

## 2024-10-09 ENCOUNTER — Ambulatory Visit: Admitting: Physical Therapy

## 2024-10-16 ENCOUNTER — Inpatient Hospital Stay

## 2024-12-08 ENCOUNTER — Ambulatory Visit: Admitting: Student

## 2025-01-01 ENCOUNTER — Other Ambulatory Visit (HOSPITAL_BASED_OUTPATIENT_CLINIC_OR_DEPARTMENT_OTHER)

## 2025-01-08 ENCOUNTER — Ambulatory Visit: Admitting: Urology

## 2025-01-08 ENCOUNTER — Other Ambulatory Visit (HOSPITAL_BASED_OUTPATIENT_CLINIC_OR_DEPARTMENT_OTHER)

## 2025-03-14 ENCOUNTER — Other Ambulatory Visit (HOSPITAL_COMMUNITY)

## 2025-03-26 ENCOUNTER — Ambulatory Visit: Payer: Self-pay | Admitting: Neurology

## 2025-07-09 ENCOUNTER — Ambulatory Visit: Admitting: Nurse Practitioner
